# Patient Record
Sex: Female | Born: 1985
Health system: Southern US, Community
[De-identification: ages and names within clinical notes are randomized; demographics above are authoritative.]

## PROBLEM LIST (undated history)

## (undated) ENCOUNTER — Inpatient Hospital Stay (HOSPITAL_COMMUNITY): Payer: Self-pay

## (undated) DIAGNOSIS — M2391 Unspecified internal derangement of right knee: Secondary | ICD-10-CM

## (undated) DIAGNOSIS — Q514 Unicornate uterus: Secondary | ICD-10-CM

## (undated) DIAGNOSIS — O3400 Maternal care for unspecified congenital malformation of uterus, unspecified trimester: Secondary | ICD-10-CM

## (undated) DIAGNOSIS — E162 Hypoglycemia, unspecified: Secondary | ICD-10-CM

## (undated) DIAGNOSIS — E039 Hypothyroidism, unspecified: Secondary | ICD-10-CM

## (undated) DIAGNOSIS — L309 Dermatitis, unspecified: Secondary | ICD-10-CM

## (undated) HISTORY — PX: TONSILLECTOMY AND ADENOIDECTOMY: SUR1326

## (undated) HISTORY — DX: Dermatitis, unspecified: L30.9

## (undated) HISTORY — PX: OTHER SURGICAL HISTORY: SHX169

---
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
## 2005-05-27 ENCOUNTER — Encounter: Payer: Self-pay | Admitting: Family Medicine

## 2006-03-25 ENCOUNTER — Other Ambulatory Visit: Admission: RE | Admit: 2006-03-25 | Discharge: 2006-03-25 | Payer: Self-pay | Admitting: Gynecology

## 2006-11-24 ENCOUNTER — Other Ambulatory Visit: Payer: Self-pay

## 2006-11-24 ENCOUNTER — Emergency Department: Payer: Self-pay | Admitting: Emergency Medicine

## 2007-04-01 ENCOUNTER — Emergency Department (HOSPITAL_COMMUNITY): Admission: EM | Admit: 2007-04-01 | Discharge: 2007-04-02 | Payer: Self-pay | Admitting: Emergency Medicine

## 2007-11-10 ENCOUNTER — Emergency Department (HOSPITAL_COMMUNITY): Admission: EM | Admit: 2007-11-10 | Discharge: 2007-11-10 | Payer: Self-pay | Admitting: Family Medicine

## 2008-01-11 ENCOUNTER — Ambulatory Visit: Payer: Self-pay | Admitting: Women's Health

## 2008-04-20 ENCOUNTER — Emergency Department (HOSPITAL_COMMUNITY): Admission: EM | Admit: 2008-04-20 | Discharge: 2008-04-20 | Payer: Self-pay | Admitting: Emergency Medicine

## 2008-08-03 ENCOUNTER — Emergency Department (HOSPITAL_COMMUNITY): Admission: EM | Admit: 2008-08-03 | Discharge: 2008-08-03 | Payer: Self-pay | Admitting: Emergency Medicine

## 2008-11-19 ENCOUNTER — Emergency Department: Payer: Self-pay | Admitting: Emergency Medicine

## 2008-11-22 ENCOUNTER — Encounter: Admission: RE | Admit: 2008-11-22 | Discharge: 2009-01-16 | Payer: Self-pay | Admitting: Occupational Medicine

## 2009-01-22 ENCOUNTER — Emergency Department (HOSPITAL_COMMUNITY): Admission: EM | Admit: 2009-01-22 | Discharge: 2009-01-22 | Payer: Self-pay | Admitting: Emergency Medicine

## 2009-01-28 ENCOUNTER — Ambulatory Visit (HOSPITAL_COMMUNITY): Admission: RE | Admit: 2009-01-28 | Discharge: 2009-01-28 | Payer: Self-pay | Admitting: Internal Medicine

## 2009-01-30 ENCOUNTER — Encounter: Admission: RE | Admit: 2009-01-30 | Discharge: 2009-02-20 | Payer: Self-pay | Admitting: Occupational Medicine

## 2009-02-08 ENCOUNTER — Encounter: Payer: Self-pay | Admitting: Family Medicine

## 2009-02-25 ENCOUNTER — Ambulatory Visit: Payer: Self-pay | Admitting: Family Medicine

## 2009-02-25 DIAGNOSIS — M25519 Pain in unspecified shoulder: Secondary | ICD-10-CM | POA: Insufficient documentation

## 2009-02-26 ENCOUNTER — Encounter: Payer: Self-pay | Admitting: Family Medicine

## 2010-02-27 NOTE — Letter (Signed)
Summary: [facility] Referral Form  [facility] Referral Form   Imported By: Marily Memos 02/26/2009 10:10:19  _____________________________________________________________________  External Attachment:    Type:   Image     Comment:   External Document
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
## 2010-02-27 NOTE — Consult Note (Signed)
Summary: [facility] occupational Health  [facility] occupational Health   Imported By: Marily Memos 03/04/2009 10:47:25  _____________________________________________________________________  External Attachment:    Type:   Image     Comment:   External Document

## 2010-10-20 LAB — I-STAT 8, (EC8 V) (CONVERTED LAB)
Acid-base deficit: 1
BUN: 9
Chloride: 109
HCT: 40
Operator id: 222501
Potassium: 3.6
pH, Ven: 7.383 — ABNORMAL HIGH

## 2010-10-20 LAB — DIFFERENTIAL
Eosinophils Absolute: 0.3
Eosinophils Relative: 3
Lymphocytes Relative: 34
Lymphs Abs: 3
Monocytes Relative: 8

## 2010-10-20 LAB — CBC
HCT: 37.4
Hemoglobin: 12.6
MCHC: 33.7
MCV: 85.2
Platelets: 267
RBC: 4.38
RDW: 13.5
WBC: 8.9

## 2010-10-20 LAB — POCT I-STAT CREATININE
Creatinine, Ser: 0.7
Operator id: 222501

## 2010-10-20 LAB — PREGNANCY, URINE: Preg Test, Ur: NEGATIVE

## 2010-12-10 ENCOUNTER — Encounter: Payer: Self-pay | Admitting: Gynecology

## 2010-12-11 ENCOUNTER — Other Ambulatory Visit (HOSPITAL_COMMUNITY)
Admission: RE | Admit: 2010-12-11 | Discharge: 2010-12-11 | Disposition: A | Discharge status: Home / Self Care | Payer: 59 | Source: Ambulatory Visit | Attending: Women's Health | Admitting: Women's Health

## 2010-12-11 ENCOUNTER — Ambulatory Visit (INDEPENDENT_AMBULATORY_CARE_PROVIDER_SITE_OTHER): Payer: 59 | Admitting: Women's Health

## 2010-12-11 ENCOUNTER — Encounter: Payer: Self-pay | Admitting: Women's Health

## 2010-12-11 VITALS — BP 120/70 | Ht 69.0 in | Wt 220.0 lb

## 2010-12-11 DIAGNOSIS — Z01419 Encounter for gynecological examination (general) (routine) without abnormal findings: Secondary | ICD-10-CM | POA: Insufficient documentation

## 2010-12-11 DIAGNOSIS — R350 Frequency of micturition: Secondary | ICD-10-CM

## 2010-12-11 DIAGNOSIS — N920 Excessive and frequent menstruation with regular cycle: Secondary | ICD-10-CM

## 2010-12-11 DIAGNOSIS — Z833 Family history of diabetes mellitus: Secondary | ICD-10-CM

## 2010-12-11 NOTE — Progress Notes (Signed)
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   Past medical history, past surgical history, family history and social history were all reviewed and documented in the EPIC chart.   ROS:  A  ROS was performed and pertinent positives and negatives are included in the history.  Exam:  Filed Vitals:   12/11/10 1208  BP: 120/70    General appearance:  Normal Head/Neck:  Normal, without cervical or supraclavicular adenopathy. Thyroid:  Symmetrical, normal in size, without palpable masses or nodularity. Respiratory  Effort:  Normal  Auscultation:  Clear without wheezing or rhonchi Cardiovascular  Auscultation:  Regular rate, without rubs, murmurs or gallops  Edema/varicosities:  Not grossly evident Abdominal  Soft,nontender, without masses, guarding or rebound.  Liver/spleen:  No organomegaly noted  Hernia:  None appreciated  Skin  Inspection:  Grossly normal  Palpation:  Grossly normal Neurologic/psychiatric  Orientation:  Normal with appropriate conversation.  Mood/affect:  Normal  Genitourinary    Breasts: Examined lying and sitting.     Right: Without masses, retractions, discharge or axillary adenopathy.     Left: Without masses, retractions, discharge or axillary adenopathy.   Inguinal/mons:  Normal without inguinal adenopathy  External genitalia:  Normal  BUS/Urethra/Skene's glands:  Normal  Bladder:  Normal  Vagina:  Normal  Cervix:  Normal  Uterus:   normal in size, shape and contour.  Midline and mobile  Adnexa/parametria:     Rt: Without masses or tenderness.   Lt: Without masses or tenderness.  Anus and perineum: Normal  Digital rectal exam: Normal sphincter tone without palpated masses or tenderness  Assessment/Plan:  25 y.o. MWF G0  for annual exam. History of irregular periods with amenorrhea, now having cycles most months but lasting 7-14 days. History of a normal TSH and prolactin  in 03. Gardasil completed 08, last Pap in 08 was normal. Recently married same partner. Complaint of increased urinary frequency and urgency.  Menorrhagia  Plan: UA negative will check urine culture. CBC, glucose, TSH, prolactin, UA and Pap. SBEs, increase exercise, decrease calories for weight loss. Contraception options were reviewed, declines will continue with condoms. Will keep a menstrual calendar, return to office for a sonohysterogram if cycles continue to be greater than 7 days.     Harrington Challenger [facility], 12:42 PM 12/11/2010

## 2010-12-12 ENCOUNTER — Other Ambulatory Visit: Payer: Self-pay

## 2010-12-12 MED ORDER — HYDROCORTISONE BUTYRATE 0.1 % EX CREA: 1.0000 "application " | TOPICAL_CREAM | Freq: Two times a day (BID) | CUTANEOUS | Status: AC

## 2010-12-12 NOTE — Telephone Encounter (Signed)
PT. FORGOT TO ASK AT RECENT VISIT WITH YOU TO REFILL HER ECZEMA CREAM.

## 2010-12-12 NOTE — Telephone Encounter (Signed)
PT. NOTIFIED BY CELL # NANCY REFILLED RX FOR HER.

## 2011-02-02 ENCOUNTER — Telehealth: Payer: Self-pay | Admitting: *Deleted

## 2011-02-02 NOTE — Telephone Encounter (Signed)
Pt calling saying she had two pregnancy test 1 on Friday and 1 on Saturday. Then this am a negative test. Pt wants to know if she should make OV or have labs drawn? LMP:  10/16/10 pt said that her periods are very irregular. Please advise

## 2011-02-02 NOTE — Telephone Encounter (Signed)
Telephone call states had one positive U PT on Friday. Negative Saturday and today. Instructed to schedule office visit. History of irregular cycles, uses condoms usually.

## 2011-02-04 ENCOUNTER — Ambulatory Visit (INDEPENDENT_AMBULATORY_CARE_PROVIDER_SITE_OTHER): Payer: 59 | Admitting: Women's Health

## 2011-02-04 ENCOUNTER — Encounter: Payer: Self-pay | Admitting: Women's Health

## 2011-02-04 ENCOUNTER — Other Ambulatory Visit: Payer: Self-pay | Admitting: Women's Health

## 2011-02-04 DIAGNOSIS — M549 Dorsalgia, unspecified: Secondary | ICD-10-CM

## 2011-02-04 DIAGNOSIS — N912 Amenorrhea, unspecified: Secondary | ICD-10-CM

## 2011-02-04 LAB — URINALYSIS, MICROSCOPIC ONLY

## 2011-02-04 LAB — URINALYSIS, ROUTINE W REFLEX MICROSCOPIC
Bilirubin Urine: NEGATIVE
Ketones, ur: 40 mg/dL — AB
Nitrite: NEGATIVE
Protein, ur: NEGATIVE mg/dL
Urobilinogen, UA: 0.2 mg/dL (ref 0.0–1.0)

## 2011-02-04 MED ORDER — MEDROXYPROGESTERONE ACETATE 10 MG PO TABS: 10.0000 mg | ORAL_TABLET | Freq: Every day | ORAL | Status: DC

## 2011-02-04 NOTE — Progress Notes (Signed)
Patient ID: Melissa Torres, female   DOB: 01-09-1986, 26 y.o.   MRN: [id number] Presents with a complaint of amenorrhea, no cycle since end of September. Condoms for contraception. Has had problems with irregular cycles in the past with a normal TSH and prolactin. Took a home U PT that was positive on 1/4, negative U PTs on 1/5 and 1/6. States also had some nausea and abdominal pain, none today. Denies any urinary symptoms or discharge  Exam: No CVAT, no abdominal pain rebound or radiation. Abdomen soft. External genitalia is within normal limits. Bimanual no CMT or adnexal fullness or tenderness uterus was small.  Amenorrhea/irregular cycles  Plan: Qual hCG, if negative withdraw with Provera 10 for 5 days. Instructed to call if no cycle after Provera. Reviewed importance of cycling at least every 60 days. Instructed to call or return if cycles space greater than 60 days.

## 2011-05-30 ENCOUNTER — Encounter (HOSPITAL_COMMUNITY): Payer: Self-pay | Admitting: Emergency Medicine

## 2011-05-30 ENCOUNTER — Emergency Department (HOSPITAL_COMMUNITY)
Admission: EM | Admit: 2011-05-30 | Discharge: 2011-05-30 | Disposition: A | Discharge status: Home / Self Care | Payer: 59 | Source: Home / Self Care | Attending: Emergency Medicine | Admitting: Emergency Medicine

## 2011-05-30 DIAGNOSIS — N939 Abnormal uterine and vaginal bleeding, unspecified: Secondary | ICD-10-CM

## 2011-05-30 DIAGNOSIS — N898 Other specified noninflammatory disorders of vagina: Secondary | ICD-10-CM

## 2011-05-30 HISTORY — DX: Hypoglycemia, unspecified: E16.2

## 2011-05-30 LAB — CBC
HCT: 38.1 % (ref 36.0–46.0)
Hemoglobin: 13 g/dL (ref 12.0–15.0)
MCH: 29.1 pg (ref 26.0–34.0)
MCHC: 34.1 g/dL (ref 30.0–36.0)
MCV: 85.2 fL (ref 78.0–100.0)
Platelets: 244 K/uL (ref 150–400)
RBC: 4.47 MIL/uL (ref 3.87–5.11)
RDW: 12.6 % (ref 11.5–15.5)
WBC: 6.6 K/uL (ref 4.0–10.5)

## 2011-05-30 LAB — POCT PREGNANCY, URINE: Preg Test, Ur: NEGATIVE

## 2011-05-30 LAB — DIFFERENTIAL
Eosinophils Absolute: 0.4 10*3/uL (ref 0.0–0.7)
Lymphocytes Relative: 34 % (ref 12–46)
Lymphs Abs: 2.2 10*3/uL (ref 0.7–4.0)
Neutro Abs: 3.4 10*3/uL (ref 1.7–7.7)
Neutrophils Relative %: 52 % (ref 43–77)

## 2011-05-30 NOTE — ED Notes (Signed)
Patient was very clammy, hyperventilating just prior to lab draw.  With great coaching, patient calmed, placed supine, cool cloth to forehead, family at bedside

## 2011-05-30 NOTE — ED Notes (Signed)
Pelvic equipment at bedside.  Patient undressed.

## 2011-05-30 NOTE — ED Provider Notes (Signed)
History     CSN: [id number]  Arrival date & time 05/30/11  1452   First MD Initiated Contact with Patient 05/30/11 1522      No chief complaint on file.   (Consider location/radiation/quality/duration/timing/severity/associated sxs/prior treatment) Patient is a 26 y.o. female presenting with vaginal bleeding. The history is provided by the patient. No language interpreter was used.  Vaginal Bleeding This is a new problem. The current episode started more than 1 week ago. The problem occurs constantly. The problem has been gradually worsening. Pertinent negatives include no chest pain and no abdominal pain. The symptoms are aggravated by nothing. The symptoms are relieved by nothing. She has tried nothing for the symptoms.  Pt complains of heavy vaginal bleeding.    No past medical history on file.  Past Surgical History  Procedure Date  . Tonsillectomy and adenoidectomy     Family History  Problem Relation Age of Onset  . Hypertension Mother   . Hypertension Maternal Grandmother   . Diabetes Maternal Grandfather     History  Substance Use Topics  . Smoking status: Never Smoker   . Smokeless tobacco: Never Used  . Alcohol Use: Yes     occassionally    OB History    Grav Para Term Preterm Abortions TAB SAB Ect Mult Living   0               Review of Systems  Cardiovascular: Negative for chest pain.  Gastrointestinal: Negative for abdominal pain.  Genitourinary: Positive for vaginal bleeding.  All other systems reviewed and are negative.    Allergies  Codeine  Home Medications   Current Outpatient Rx  Name Route Sig Dispense Refill  . HYDROCORTISONE 1 % EX CREA Topical Apply topically as needed.      Marland Kitchen MEDROXYPROGESTERONE ACETATE 10 MG PO TABS Oral Take 1 tablet (10 mg total) by mouth daily. 5 tablet 0    BP 125/86  Pulse 85  Temp(Src) 98.3 F (36.8 C) (Oral)  Resp 18  SpO2 98%  Physical Exam  Nursing note and vitals reviewed. Constitutional: She  appears well-developed and well-nourished.  HENT:  Head: Normocephalic and atraumatic.  Right Ear: External ear normal.  Left Ear: External ear normal.  Nose: Nose normal.  Eyes: Conjunctivae and EOM are normal. Pupils are equal, round, and reactive to light.  Neck: Normal range of motion. Neck supple.  Pulmonary/Chest: Effort normal.  Abdominal: Soft.  Genitourinary: Uterus normal.       Moderate vaginal bleeding  Musculoskeletal: Normal range of motion.  Neurological: She is alert.  Skin: Skin is warm.    ED Course  Procedures (including critical care time)  Labs Reviewed - No data to display No results found.   No diagnosis found.    MDM  No evidence of anemia,  No hemorrhage        Lonia Skinner [place], [place] 05/30/11 1709

## 2011-05-30 NOTE — ED Notes (Signed)
Assisted with lab draw, butterfly needle and syringe to right ac.  10cc blood drawn slowly.  Dressing applied.  Blood taken by jasmine, phlebotomy

## 2011-05-30 NOTE — Discharge Instructions (Signed)
Abnormal Uterine Bleeding Abnormal uterine bleeding can have many causes. Some cases are simply treated, while others are more serious. There are several kinds of bleeding that is considered abnormal, including:  Bleeding between periods.   Bleeding after sexual intercourse.   Spotting anytime in the menstrual cycle.   Bleeding heavier or more than normal.   Bleeding after menopause.  CAUSES  There are many causes of abnormal uterine bleeding. It can be present in teenagers, pregnant women, women during their reproductive years, and women who have reached menopause. Your caregiver will look for the more common causes depending on your age, signs, symptoms and your particular circumstance. Most cases are not serious and can be treated. Even the more serious causes, like cancer of the female organs, can be treated adequately if found in the early stages. That is why all types of bleeding should be evaluated and treated as soon as possible. DIAGNOSIS  Diagnosing the cause may take several kinds of tests. Your caregiver may:  Take a complete history of the type of bleeding.   Perform a complete physical exam and Pap smear.   Take an ultrasound on the abdomen showing a picture of the female organs and the pelvis.   Inject dye into the uterus and Fallopian tubes and X-ray them (hysterosalpingogram).   Place fluid in the uterus and do an ultrasound (sonohysterogrqphy).   Take a CT scan to examine the female organs and pelvis.   Take an MRI to examine the female organs and pelvis. There is no X-ray involved with this procedure.   Look inside the uterus with a telescope that has a light at the end (hysteroscopy).   Scrap the inside of the uterus to get tissue to examine (Dilatation and Curettage, D&C).   Look into the pelvis with a telescope that has a light at the end (laparoscopy). This is done through a very small cut (incision) in the abdomen.  TREATMENT  Treatment will depend on the  cause of the abnormal bleeding. It can include:  Doing nothing to allow the problem to take care of itself over time.   Hormone treatment.   Birth control pills.   Treating the medical condition causing the problem.   Laparoscopy.   Major or minor surgery   Destroying the lining of the uterus with electrical currant, laser, freezing or heat (uterine ablation).  HOME CARE INSTRUCTIONS   Follow your caregiver's recommendation on how to treat your problem.   See your caregiver if you missed a menstrual period and think you may be pregnant.   If you are bleeding heavily, count the number of pads/tampons you use and how often you have to change them. Tell this to your caregiver.   Avoid sexual intercourse until the problem is controlled.  SEEK MEDICAL CARE IF:   You have any kind of abnormal bleeding mentioned above.   You feel dizzy at times.   You are 26 years old and have not had a menstrual period yet.  SEEK IMMEDIATE MEDICAL CARE IF:   You pass out.   You are changing pads/tampons every 15 to 30 minutes.   You have belly (abdominal) pain.   You have a temperature of 100 F (37.8 C) or higher.   You become sweaty or weak.   You are passing large blood clots from the vagina.   You start to feel sick to your stomach (nauseous) and throw up (vomit).  Document Released: 01/12/2005 Document Revised: 01/01/2011 Document Reviewed: 06/07/2008 Exit[employer]   Patient Information 2012 [employer]. 

## 2011-05-30 NOTE — ED Notes (Signed)
Family at bedside.assisted patient with getting cleaned from exam and dressing.  Equipment removed.

## 2011-05-30 NOTE — ED Provider Notes (Signed)
Medical screening examination/treatment/procedure(s) were performed by non-physician practitioner and as supervising physician I was immediately available for consultation/collaboration.  Raynald Blend, MD 05/30/11 [phone]

## 2011-05-30 NOTE — ED Notes (Signed)
Reports 2 day history of dizzy, weak spells, nearly passing out.  Reports having menstrual cycle for 3 months, from light bleeding to passing clots.  Saw pcp in January for not having a period, period started with taking prescribed medicine.  Has not spoken to pcp about current complaints.  Marland Kitchen

## 2011-06-01 ENCOUNTER — Other Ambulatory Visit: Payer: Self-pay | Admitting: Women's Health

## 2011-06-01 DIAGNOSIS — N921 Excessive and frequent menstruation with irregular cycle: Secondary | ICD-10-CM

## 2011-06-01 NOTE — Progress Notes (Signed)
Telephone call, states went to the hospital, had a  H&H 13/38, negative pregnancy test. History of irregular cycles, did not use Provera when seen in the office in January for amenorrhea, cycle started on its own, but has had irregular bleeding since. Will take Provera now to stop bleeding. Sonohysterogram next week with Dr. Audie Box.

## 2011-06-02 ENCOUNTER — Telehealth: Payer: Self-pay | Admitting: *Deleted

## 2011-06-02 NOTE — Telephone Encounter (Signed)
Pt doesn't want to do SHGM in the office. She is scared to death! She hasnt eaten much the past week just thinking about it. She asked about doing it in the OR and then i mentioned cost to her and she is going to ask her insurance what her deductible is and co insurance and i mentioned maybe giving medication in the office to do it. Can you call her?

## 2011-06-03 ENCOUNTER — Other Ambulatory Visit: Payer: Self-pay | Admitting: Women's Health

## 2011-06-03 DIAGNOSIS — F419 Anxiety disorder, unspecified: Secondary | ICD-10-CM

## 2011-06-03 MED ORDER — DIAZEPAM 5 MG PO TABS: 5.0000 mg | ORAL_TABLET | Freq: Four times a day (QID) | ORAL | Status: AC | PRN

## 2011-06-03 NOTE — Telephone Encounter (Signed)
Left message on cell to call in regards to scheduling sonohysterogram and possibly taking medication to relax prior.

## 2011-06-03 NOTE — Telephone Encounter (Signed)
Telephone call, states has fear of doctors and procedures. Will take Valium 5 mg 3 hours before procedure and when arrives to office for sonohysterogram with Dr. Audie Box. Instructed to have someone drive her to and from appointment.

## 2011-06-04 NOTE — Progress Notes (Signed)
GENERIC VALIUM RX PRINTED AT OFFICE BUT TIM AT [facility] STATES NANCY CALLED IN RX AND PT. ALREADY PICKED IT UP.

## 2011-06-08 ENCOUNTER — Telehealth: Payer: Self-pay | Admitting: *Deleted

## 2011-06-08 MED ORDER — MEGESTROL ACETATE 20 MG PO TABS: 20.0000 mg | ORAL_TABLET | Freq: Two times a day (BID) | ORAL | Status: DC

## 2011-06-08 NOTE — Telephone Encounter (Signed)
I would keep the sonohysterogram appointment.

## 2011-06-08 NOTE — Telephone Encounter (Signed)
PT CALLING C/O BLEEDING AFTER TAKEN PROVERA #5 GIVEN BY NANCY ON 06/02/11. PT IS SCHEDULE FOR SHGM  ON 06/11/11, PT SAID THAT SHE WOULD CANCEL HER SHGM AND JUST TALK ABOUT A POSSIBLE D & C ? PT IS AWARE NANCY WILL RETURN ON WEDNESDAY. PT WOULD LIKE TO KNOW YOUR RECOMMENDATION, SHOULD SHE KEEP SHGM APPOINTMENT AND SPEAK WITH NANCY AT LATER DAY? PLEASE ADVISE

## 2011-06-08 NOTE — Telephone Encounter (Signed)
PT SAID SHE IS STILL BLEEDING, SHOULD SHE HAVE ANOTHER RX TO STOP?

## 2011-06-08 NOTE — Telephone Encounter (Signed)
I would suggest Megace 20 mg twice daily through sonohysterogram. Just make sure that pregnancy is not a possibility. If any question I would check a blood pregnancy test.

## 2011-06-08 NOTE — Telephone Encounter (Signed)
Pt informed with the below note. 

## 2011-06-10 ENCOUNTER — Telehealth: Payer: Self-pay | Admitting: Women's Health

## 2011-06-10 ENCOUNTER — Telehealth: Payer: Self-pay | Admitting: Gynecology

## 2011-06-10 NOTE — Telephone Encounter (Signed)
Telephone call from [place], states would prefer to have sonohysterogram done in the OR.  If uterine polyp, can then have removed at the same time. Was scheduled for sonohysterogram tomorrow in the office, states is still bleeding. Will double up on Megace and take 40 mg twice daily today and if bleeding starts to decrease will then decrease to 20 mg twice a day until sonohysterogram. Instructed to call if continues to have heavy bleeding. Has a fear of physicians and procedures. Will try to schedule sonohysterogram in the next week with Dr. Audie Box.

## 2011-06-10 NOTE — Telephone Encounter (Signed)
Left message on cell number. per patient's request called back in regards to sonohysterogram scheduled for today.

## 2011-06-10 NOTE — Telephone Encounter (Signed)
I called patient because earlier today [place] sent me a message to schedule her for [facility] in the OR due to patient had requested due to anxiety about office procedure. She wanted to do it there in case Dr. Velvet Bathe found something, he could take care of it then.  When I called patient and told her Dr. Velvet Bathe recommended U/S in the office and then he would do D&C, Hysteroscopy at the hospital she said she thought he wanted to do [facility].  I told her he did but these recommendations were because we had understood that she did not feel like she could do SHGM due to anxiety. She said she had talked with [place] again and she had prescribed some Valium for her to take and she was willing to come in and do the [facility].   We scheduled her [facility] for Friday May 31 at 3:30pm.  I assured her we will take good care of her and tried reassuring her that this is common office procedure and patients do well with it.

## 2011-06-11 ENCOUNTER — Other Ambulatory Visit: Payer: Self-pay | Admitting: Women's Health

## 2011-06-11 ENCOUNTER — Ambulatory Visit: Payer: 59 | Admitting: Gynecology

## 2011-06-11 ENCOUNTER — Other Ambulatory Visit: Payer: 59

## 2011-06-11 MED ORDER — MEGESTROL ACETATE 20 MG PO TABS: 20.0000 mg | ORAL_TABLET | Freq: Two times a day (BID) | ORAL | Status: DC

## 2011-06-11 NOTE — Telephone Encounter (Signed)
Pt ended up calling Olegario Messier and have since scheduled surgery. KW

## 2011-06-11 NOTE — Telephone Encounter (Signed)
Telephone call states bleeding slowing down, used 8 pads today, needs refill of megace. Will continue 20 mg until bleeding stops.

## 2011-06-18 ENCOUNTER — Other Ambulatory Visit: Payer: Self-pay | Admitting: Women's Health

## 2011-06-18 ENCOUNTER — Telehealth: Payer: Self-pay | Admitting: *Deleted

## 2011-06-18 DIAGNOSIS — N920 Excessive and frequent menstruation with regular cycle: Secondary | ICD-10-CM

## 2011-06-18 MED ORDER — MEGESTROL ACETATE 20 MG PO TABS: 20.0000 mg | ORAL_TABLET | Freq: Two times a day (BID) | ORAL | Status: AC

## 2011-06-18 NOTE — Progress Notes (Signed)
Telephone call from patient, states bleeding had slowed, now increased bleeding today, has used about 10 pads today, passing clots. States has already taken 3 prescriptions of Megace. Is scheduled for a sonohysterogram on 5/31 with Dr. Audie Box. First sonohysterogram had to be canceled due to bleeding. Will start on Megace 20 twice a day will take 2 tablets tonight and tomorrow morning, reviewed if bleeding continues to be heavy to take 2 tablets twice daily until bleeding slows/stops and then 1 tablet twice daily until procedure.

## 2011-06-18 NOTE — Telephone Encounter (Signed)
Left message for pt to call,pt left voicemail regarding vaginal bleeding.

## 2011-06-19 ENCOUNTER — Other Ambulatory Visit: Payer: Self-pay | Admitting: Women's Health

## 2011-06-19 DIAGNOSIS — N921 Excessive and frequent menstruation with irregular cycle: Secondary | ICD-10-CM

## 2011-06-24 ENCOUNTER — Telehealth: Payer: Self-pay | Admitting: *Deleted

## 2011-06-24 NOTE — Telephone Encounter (Signed)
Monitor at present as ultrasound is in 2 days and follow up for her ultrasound regardless of bleeding.

## 2011-06-24 NOTE — Telephone Encounter (Signed)
Pt is scheduled for [facility] on Friday and wanted you to know she is still bleeding while taking the megace 20 mg, rx never stopped bleeding. Pt was told to follow up.

## 2011-06-24 NOTE — Telephone Encounter (Signed)
Left the below note on voicemail. 

## 2011-06-26 ENCOUNTER — Encounter: Payer: Self-pay | Admitting: Gynecology

## 2011-06-26 ENCOUNTER — Ambulatory Visit (INDEPENDENT_AMBULATORY_CARE_PROVIDER_SITE_OTHER): Payer: 59 | Admitting: Gynecology

## 2011-06-26 ENCOUNTER — Ambulatory Visit (INDEPENDENT_AMBULATORY_CARE_PROVIDER_SITE_OTHER): Payer: 59

## 2011-06-26 DIAGNOSIS — N949 Unspecified condition associated with female genital organs and menstrual cycle: Secondary | ICD-10-CM

## 2011-06-26 DIAGNOSIS — N938 Other specified abnormal uterine and vaginal bleeding: Secondary | ICD-10-CM

## 2011-06-26 DIAGNOSIS — N921 Excessive and frequent menstruation with irregular cycle: Secondary | ICD-10-CM

## 2011-06-26 DIAGNOSIS — N926 Irregular menstruation, unspecified: Secondary | ICD-10-CM

## 2011-06-26 DIAGNOSIS — N920 Excessive and frequent menstruation with regular cycle: Secondary | ICD-10-CM

## 2011-06-26 MED ORDER — TRAMADOL HCL 50 MG PO TABS: 50.0000 mg | ORAL_TABLET | Freq: Four times a day (QID) | ORAL | Status: AC | PRN

## 2011-06-26 NOTE — Patient Instructions (Signed)
Follow up for blood work and biopsy results.

## 2011-06-26 NOTE — Progress Notes (Signed)
Patient follows up for sonohysterogram. She has a lifelong history of irregular menses since menarche. She would go months without menses. Her last normal menstrual period was in the fall. She then skipped several months and started bleeding in January. She bled continuously alternating between light and heavy. She took progesterone withdrawal and continued to bleed. She ultimately was started on Megace but continued to bleed on that and now presents for sonohysterogram.  Ultrasound shows endometrial echo 4.4 mm. Retroverted uterus with normal size shape and echotexture. Right and left ovaries grossly normal with physiologic changes. Cul-de-sac is negative. Sonohysterogram was performed, sterile technique, easy catheter introduction, good distention with no overt abnormalities. There is some irregularity to the endometrial cavity which I think is more endometrial related. No gross polyps or myomas. Endometrial sample was taken. Patient tolerated well.  Assessment and plan: Irregular menses with PCO-type pattern. We'll check baseline labs to include TSH FSH prolactin hCG. She does note some bruisability am going to check a von Willebrand's panel also.  We'll recheck CBC to compare to previous value. Patient will follow up for lab results and biopsy. Various scenarios and options were reviewed. I think if labs and biopsy are normal I am going to start her on a low-dose oral contraceptive and see if we can't regulate her over the next several months. Possibilities for hysteroscopy D&C if irregular bleeding continues was discussed.  A prescription for Ultram 50 mg #30 one by mouth every 6 hours prescribed to help with cramping. She has tried Motrin and this does not seem to help.

## 2011-06-27 LAB — CBC WITH DIFFERENTIAL/PLATELET
Basophils Absolute: 0 10*3/uL (ref 0.0–0.1)
HCT: 27.4 % — ABNORMAL LOW (ref 36.0–46.0)
Lymphocytes Relative: 40 % (ref 12–46)
Monocytes Absolute: 0.4 10*3/uL (ref 0.1–1.0)
Neutro Abs: 2.9 10*3/uL (ref 1.7–7.7)
RBC: 3.24 MIL/uL — ABNORMAL LOW (ref 3.87–5.11)
RDW: 14 % (ref 11.5–15.5)
WBC: 6.5 10*3/uL (ref 4.0–10.5)

## 2011-06-27 LAB — HCG, QUANTITATIVE, PREGNANCY: hCG, Beta Chain, Quant, S: <2 m[IU]/mL

## 2011-06-27 LAB — TSH: TSH: 2.849 u[IU]/mL (ref 0.350–4.500)

## 2011-06-29 LAB — VON WILLEBRAND PANEL: Von Willebrand Antigen, Plasma: 72 % (ref 50–217)

## 2011-06-30 MED ORDER — NORGESTIMATE-ETH ESTRADIOL 0.25-35 MG-MCG PO TABS: 1.0000 | ORAL_TABLET | Freq: Every day | ORAL | Status: DC

## 2011-12-22 ENCOUNTER — Encounter: Payer: Self-pay | Admitting: *Deleted

## 2011-12-22 NOTE — Progress Notes (Signed)
Patient ID: Melissa Torres, female   DOB: 14-Apr-1985, 26 y.o.   MRN: [id number] Pt called left message in voicemail requesting to speak with nancy about irregular cycle? Left message for pt to call.

## 2012-01-05 ENCOUNTER — Other Ambulatory Visit: Payer: Self-pay | Admitting: Gynecology

## 2012-01-13 ENCOUNTER — Telehealth: Payer: Self-pay | Admitting: *Deleted

## 2012-01-13 MED ORDER — HYDROCORTISONE 1 % EX CREA: TOPICAL_CREAM | CUTANEOUS | Status: DC | PRN

## 2012-01-13 NOTE — Telephone Encounter (Signed)
Pt called requesting refill on hydrocortisone cream, rx sent. Pt uses for her hands.

## 2012-02-02 ENCOUNTER — Telehealth: Payer: Self-pay | Admitting: *Deleted

## 2012-02-02 MED ORDER — NORGESTIMATE-ETH ESTRADIOL 0.25-35 MG-MCG PO TABS: 1.0000 | ORAL_TABLET | Freq: Every day | ORAL | Status: DC

## 2012-02-02 NOTE — Telephone Encounter (Signed)
Pt called requesting refill on sprintec birth control. Pt overdue for annual in Nov 2013. Pt scheduled annual on 02/15/12. With nancy. 1 pack will be sent. Pt is aware she must keep her appointment.

## 2012-02-15 ENCOUNTER — Encounter: Payer: 59 | Admitting: Women's Health

## 2012-02-23 ENCOUNTER — Other Ambulatory Visit: Payer: Self-pay | Admitting: Gynecology

## 2012-02-23 MED ORDER — NORGESTIMATE-ETH ESTRADIOL 0.25-35 MG-MCG PO TABS: 1.0000 | ORAL_TABLET | Freq: Every day | ORAL | Status: DC

## 2012-03-12 ENCOUNTER — Other Ambulatory Visit: Payer: Self-pay

## 2012-03-22 ENCOUNTER — Emergency Department (HOSPITAL_COMMUNITY)
Admission: EM | Admit: 2012-03-22 | Discharge: 2012-03-22 | Disposition: A | Discharge status: Home / Self Care | Payer: 59 | Source: Home / Self Care | Attending: Family Medicine | Admitting: Family Medicine

## 2012-03-22 ENCOUNTER — Encounter (HOSPITAL_COMMUNITY): Payer: Self-pay | Admitting: *Deleted

## 2012-03-22 DIAGNOSIS — J020 Streptococcal pharyngitis: Secondary | ICD-10-CM

## 2012-03-22 MED ORDER — ACETAMINOPHEN 325 MG PO TABS
ORAL_TABLET | ORAL | Status: AC
  Filled 2012-03-22: qty 2

## 2012-03-22 MED ORDER — ONDANSETRON 4 MG PO TBDP: 4.0000 mg | ORAL_TABLET | Freq: Three times a day (TID) | ORAL | Status: DC | PRN

## 2012-03-22 MED ORDER — HYDROCODONE-ACETAMINOPHEN 7.5-325 MG/15ML PO SOLN: 15.0000 mL | Freq: Three times a day (TID) | ORAL | Status: DC | PRN

## 2012-03-22 MED ORDER — CEPHALEXIN 500 MG PO CAPS: 500.0000 mg | ORAL_CAPSULE | Freq: Three times a day (TID) | ORAL | Status: DC

## 2012-03-22 MED ORDER — IBUPROFEN 600 MG PO TABS
600.0000 mg | ORAL_TABLET | Freq: Three times a day (TID) | ORAL | Status: DC | PRN
Start: 2012-03-22 — End: 2016-05-26

## 2012-03-22 MED ORDER — ACETAMINOPHEN 325 MG PO TABS
650.0000 mg | ORAL_TABLET | Freq: Once | ORAL | Status: AC
  Administered 2012-03-22: 650 mg via ORAL

## 2012-03-22 NOTE — ED Notes (Signed)
Pt  Reports  Symptoms  Of  Chills  Fever  Body  Aches   And  Nausea   Which  Developed  yest       No  Vomiting  No  Diarrhea  -  No  Antipyretics  Today

## 2012-03-22 NOTE — ED Provider Notes (Signed)
History     CSN: [id number]  Arrival date & time 03/22/12  1012   First MD Initiated Contact with Patient 03/22/12 1028      Chief Complaint  Patient presents with  . Fever    (Consider location/radiation/quality/duration/timing/severity/associated sxs/prior treatment) HPI Comments: 27 year old female here complaining of generalized body aches, chills and fever up to 102 that started yesterday. Symptoms associated with sore throat and painful swallowing. Reports minimal cough nonproductive. Denies nasal congestion or rhinorrhea. Has not taken any medications for her symptoms today her temperature is 102 here. Denies chest pain or shortness of breath. Patient reports nausea but has not had any vomiting. Reports generalized abdominal discomfort but no diarrhea. Has decreased appetite but is tolerating fluids and solids. No rash. No other known sick contacts.   Past Medical History  Diagnosis Date  . Hypoglycemia     Past Surgical History  Procedure Laterality Date  . Tonsillectomy and adenoidectomy      Family History  Problem Relation Age of Onset  . Hypertension Mother   . Hypertension Maternal Grandmother   . Diabetes Maternal Grandfather     History  Substance Use Topics  . Smoking status: Never Smoker   . Smokeless tobacco: Never Used  . Alcohol Use: Yes     Comment: occassionally    OB History   Grav Para Term Preterm Abortions TAB SAB Ect Mult Living   0               Review of Systems  Constitutional: Positive for fever and appetite change.  HENT: Positive for sore throat and trouble swallowing. Negative for congestion.   Eyes: Negative for discharge.  Respiratory: Negative for shortness of breath and wheezing.   Gastrointestinal: Positive for nausea. Negative for vomiting and abdominal pain.  Skin: Negative for rash.  Neurological: Positive for headaches. Negative for dizziness.  All other systems reviewed and are negative.    Allergies  Codeine;  Ivp dye; and Latex  Home Medications   Current Outpatient Rx  Name  Route  Sig  Dispense  Refill  . cephALEXin (KEFLEX) 500 MG capsule   Oral   Take 1 capsule (500 mg total) by mouth 3 (three) times daily.   30 capsule   0   . HYDROcodone-acetaminophen (HYCET) 7.5-325 mg/15 ml solution   Oral   Take 15 mLs by mouth every 8 (eight) hours as needed for pain or cough.   120 mL   0   . hydrocortisone cream 1 %   Topical   Apply topically as needed.   30 g   1   . ibuprofen (ADVIL,MOTRIN) 600 MG tablet   Oral   Take 1 tablet (600 mg total) by mouth every 8 (eight) hours as needed for pain or fever.   30 tablet   0   . EXPIRED: medroxyPROGESTERone (PROVERA) 10 MG tablet   Oral   Take 1 tablet (10 mg total) by mouth daily.   5 tablet   0   . norgestimate-ethinyl estradiol (SPRINTEC 28) 0.25-35 MG-MCG tablet   Oral   Take 1 tablet by mouth daily.   28 tablet   2   . ondansetron (ZOFRAN-ODT) 4 MG disintegrating tablet   Oral   Take 1 tablet (4 mg total) by mouth every 8 (eight) hours as needed for nausea.   10 tablet   0     BP 140/40  Pulse 106  Temp(Src) 102 F (38.9 C) (Oral)  Resp 16  SpO2 100%  LMP 03/01/2012  Physical Exam  Nursing note and vitals reviewed. Constitutional: She is oriented to person, place, and time. She appears well-developed and well-nourished. No distress.  HENT:  Head: Normocephalic and atraumatic.  Nose normal, no rhinorrhea. Significant pharyngeal erythema with swelling of pilars and white exudates, s/p tonsilectomy. No uvula deviation. No trismus. TM's with increased vascular markings and some dullness bilaterally no swelling or bulging  Eyes: Conjunctivae and EOM are normal. Pupils are equal, round, and reactive to light. No scleral icterus.  Neck: No thyromegaly present.  Bilateral mildly enlarged tender anterior neck lymphadenopathies.  Cardiovascular: Normal rate, regular rhythm and normal heart sounds.  Exam reveals no  gallop and no friction rub.   No murmur heard. Pulmonary/Chest: Effort normal and breath sounds normal. No respiratory distress. She has no wheezes. She has no rales. She exhibits no tenderness.  Abdominal: Soft. She exhibits no distension and no mass. There is no tenderness. There is no rebound and no guarding.  No hepatosplenomegaly.   Lymphadenopathy:    She has cervical adenopathy.  Neurological: She is alert and oriented to person, place, and time.  Skin: No rash noted. She is not diaphoretic.    ED Course  Procedures (including critical care time)  Labs Reviewed  POCT RAPID STREP A ([facility] CARE ONLY) - Abnormal; Notable for the following:    Streptococcus, Group A Screen (Direct) POSITIVE (*)    All other components within normal limits   No results found.   1. Strep throat       MDM  Treated with Keflex. Also prescribed hydrocodone, ondansetron and ibuprofen. Supportive care and red flags that should prompt her return to medical attention discussed with patient and provided in writing.        Sharin Grave, MD 03/22/12 1154

## 2012-03-28 ENCOUNTER — Ambulatory Visit (INDEPENDENT_AMBULATORY_CARE_PROVIDER_SITE_OTHER): Payer: 59 | Admitting: Women's Health

## 2012-03-28 ENCOUNTER — Other Ambulatory Visit (HOSPITAL_COMMUNITY)
Admission: RE | Admit: 2012-03-28 | Discharge: 2012-03-28 | Disposition: A | Discharge status: Home / Self Care | Payer: 59 | Source: Ambulatory Visit | Attending: Obstetrics and Gynecology | Admitting: Obstetrics and Gynecology

## 2012-03-28 ENCOUNTER — Encounter: Payer: Self-pay | Admitting: Women's Health

## 2012-03-28 VITALS — BP 118/76 | Ht 68.0 in | Wt 182.0 lb

## 2012-03-28 DIAGNOSIS — N912 Amenorrhea, unspecified: Secondary | ICD-10-CM

## 2012-03-28 DIAGNOSIS — Z01419 Encounter for gynecological examination (general) (routine) without abnormal findings: Secondary | ICD-10-CM

## 2012-03-28 DIAGNOSIS — Z309 Encounter for contraceptive management, unspecified: Secondary | ICD-10-CM

## 2012-03-28 DIAGNOSIS — Z833 Family history of diabetes mellitus: Secondary | ICD-10-CM

## 2012-03-28 DIAGNOSIS — IMO0001 Reserved for inherently not codable concepts without codable children: Secondary | ICD-10-CM

## 2012-03-28 LAB — CBC WITH DIFFERENTIAL/PLATELET
Eosinophils Absolute: 0.4 10*3/uL (ref 0.0–0.7)
Eosinophils Relative: 8 % — ABNORMAL HIGH (ref 0–5)
HCT: 36.4 % (ref 36.0–46.0)
Lymphocytes Relative: 35 % (ref 12–46)
Lymphs Abs: 1.7 10*3/uL (ref 0.7–4.0)
MCH: 25.5 pg — ABNORMAL LOW (ref 26.0–34.0)
MCV: 77.3 fL — ABNORMAL LOW (ref 78.0–100.0)
Monocytes Absolute: 0.4 10*3/uL (ref 0.1–1.0)
Monocytes Relative: 7 % (ref 3–12)
Platelets: 326 10*3/uL (ref 150–400)
RBC: 4.71 MIL/uL (ref 3.87–5.11)
WBC: 5 10*3/uL (ref 4.0–10.5)

## 2012-03-28 MED ORDER — NORGESTIMATE-ETH ESTRADIOL 0.25-35 MG-MCG PO TABS: 1.0000 | ORAL_TABLET | Freq: Every day | ORAL | Status: DC

## 2012-03-28 NOTE — Patient Instructions (Signed)

## 2012-03-28 NOTE — Progress Notes (Signed)
Melissa Torres 03-08-1985 [id number]    History:    The patient presents for annual exam.  Regular monthly cycle on Sprintec. History of amenorrhea alternating with 7-14 day cycles with menorrhagia. 05/2011 -  normal sonohysterogram, TSH, prolactin, FSH,  Coagulation factor VIII slightly low (62), hemoglobin and hematocrit 8.7/27.4. History of normal Paps,  gardasil completed 2009. Treated for strep throat last week.  Past medical history, past surgical history, family history and social history were all reviewed and documented in the EPIC chart. Nurse in the OR.   ROS:  A  ROS was performed and pertinent positives and negatives are included in the history.  Exam:  Filed Vitals:   03/28/12 0903  BP: 118/76    General appearance:  Normal Head/Neck:  Normal, without cervical or supraclavicular adenopathy. Thyroid:  Symmetrical, normal in size, without palpable masses or nodularity. Respiratory  Effort:  Normal  Auscultation:  Clear without wheezing or rhonchi Cardiovascular  Auscultation:  Regular rate, without rubs, murmurs or gallops  Edema/varicosities:  Not grossly evident Abdominal  Soft,nontender, without masses, guarding or rebound.  Liver/spleen:  No organomegaly noted  Hernia:  None appreciated  Skin  Inspection:  Grossly normal  Palpation:  Grossly normal Neurologic/psychiatric  Orientation:  Normal with appropriate conversation.  Mood/affect:  Normal  Genitourinary    Breasts: Examined lying and sitting.     Right: Without masses, retractions, discharge or axillary adenopathy.     Left: Without masses, retractions, discharge or axillary adenopathy.   Inguinal/mons:  Normal without inguinal adenopathy  External genitalia:  Normal  BUS/Urethra/Skene's glands:  Normal  Bladder:  Normal  Vagina:  Normal  Cervix:  Normal  Uterus: normal in size, shape and contour.  Midline and mobile  Adnexa/parametria:     Rt: Without masses or tenderness.   Lt: Without  masses or tenderness.  Anus and perineum: Normal  Digital rectal exam: Normal sphincter tone without palpated masses or tenderness  Assessment/Plan:  27 y.o. M. WF G0 for annual exam with occasional abdominal tenderness.  Normal GYN exam on Sprintec Coagulation factor VIII slightly low with anemia  Plan: CBC, coagulation factor VIII, glucose, UA, Pap, normal Pap 11/2010, new screening guidelines reviewed. Sprintec prescription, proper use, slight risk for blood clots and strokes reviewed. Has lost 40 pounds with diet and exercise, will continue healthy lifestyle. Instructed to call if  pain continues, pain appears to be more in the stomach region, pelvic ultrasound and referral as needed.      Harrington Challenger [facility], 12:18 PM 03/28/2012

## 2012-03-29 LAB — URINALYSIS W MICROSCOPIC + REFLEX CULTURE
Bacteria, UA: NONE SEEN
Bilirubin Urine: NEGATIVE
Casts: NONE SEEN
Crystals: NONE SEEN
Glucose, UA: NEGATIVE mg/dL
Ketones, ur: NEGATIVE mg/dL
Specific Gravity, Urine: 1.017 (ref 1.005–1.030)
Squamous Epithelial / LPF: NONE SEEN
Urobilinogen, UA: 0.2 mg/dL (ref 0.0–1.0)
pH: 7 (ref 5.0–8.0)

## 2012-04-06 ENCOUNTER — Encounter (HOSPITAL_COMMUNITY): Payer: Self-pay | Admitting: Emergency Medicine

## 2012-04-06 ENCOUNTER — Emergency Department (HOSPITAL_COMMUNITY)
Admission: EM | Admit: 2012-04-06 | Discharge: 2012-04-06 | Disposition: A | Discharge status: Home / Self Care | Payer: 59 | Source: Home / Self Care | Attending: Family Medicine | Admitting: Family Medicine

## 2012-04-06 DIAGNOSIS — J029 Acute pharyngitis, unspecified: Secondary | ICD-10-CM

## 2012-04-06 MED ORDER — CLINDAMYCIN HCL 300 MG PO CAPS: 300.0000 mg | ORAL_CAPSULE | Freq: Three times a day (TID) | ORAL | Status: DC

## 2012-04-06 NOTE — ED Notes (Signed)
Pt is here for cold sx  Seen here at the [facility] on 03/22/12 Sx today include: sore throat, headache, chills, fatigue, fever Denies: abd pain Finished antibiotics given last time  She is alert w/no signs of acute distress.

## 2012-04-06 NOTE — ED Provider Notes (Signed)
History     CSN: [id number]  Arrival date & time 04/06/12  1513   First MD Initiated Contact with Patient 04/06/12 1545      Chief Complaint  Patient presents with  . URI    (Consider location/radiation/quality/duration/timing/severity/associated sxs/prior treatment) Patient is a 27 y.o. female presenting with pharyngitis. The history is provided by the patient.  Sore Throat This is a recurrent problem. The current episode started yesterday. The problem has been gradually worsening. Associated symptoms include headaches. Associated symptoms comments: Fever, aches . The symptoms are aggravated by swallowing.    Past Medical History  Diagnosis Date  . Hypoglycemia     Past Surgical History  Procedure Laterality Date  . Tonsillectomy and adenoidectomy      Family History  Problem Relation Age of Onset  . Hypertension Mother   . Diabetes Mother   . Hypertension Maternal Grandmother   . Diabetes Maternal Grandfather     History  Substance Use Topics  . Smoking status: Never Smoker   . Smokeless tobacco: Never Used  . Alcohol Use: Yes     Comment: occassionally    OB History   Grav Para Term Preterm Abortions TAB SAB Ect Mult Living   0               Review of Systems  Constitutional: Positive for fever and chills.  HENT: Negative for congestion, rhinorrhea and postnasal drip.   Respiratory: Negative for cough.   Cardiovascular: Negative.   Gastrointestinal: Negative.   Musculoskeletal: Positive for myalgias.  Skin: Negative.   Neurological: Positive for headaches.    Allergies  Codeine; Ivp dye; and Latex  Home Medications   Current Outpatient Rx  Name  Route  Sig  Dispense  Refill  . clindamycin (CLEOCIN) 300 MG capsule   Oral   Take 1 capsule (300 mg total) by mouth 3 (three) times daily.   21 capsule   0   . ibuprofen (ADVIL,MOTRIN) 600 MG tablet   Oral   Take 1 tablet (600 mg total) by mouth every 8 (eight) hours as needed for pain or  fever.   30 tablet   0   . norgestimate-ethinyl estradiol (SPRINTEC 28) 0.25-35 MG-MCG tablet   Oral   Take 1 tablet by mouth daily.   28 tablet   12   . ondansetron (ZOFRAN-ODT) 4 MG disintegrating tablet   Oral   Take 1 tablet (4 mg total) by mouth every 8 (eight) hours as needed for nausea.   10 tablet   0     BP 115/76  Pulse 94  Temp(Src) 98.3 F (36.8 C) (Oral)  Resp 18  SpO2 100%  LMP 03/30/2012  Physical Exam  Nursing note and vitals reviewed. Constitutional: She is oriented to person, place, and time. She appears well-developed and well-nourished.  HENT:  Head: Normocephalic.  Right Ear: External ear normal.  Left Ear: External ear normal.  Mouth/Throat: Mucous membranes are normal. Posterior oropharyngeal erythema present. No tonsillar abscesses.    Neck: Normal range of motion. Neck supple.  Cardiovascular: Regular rhythm and normal heart sounds.   Pulmonary/Chest: Breath sounds normal.  Lymphadenopathy:    She has cervical adenopathy.  Neurological: She is alert and oriented to person, place, and time.  Skin: Skin is warm and dry.    ED Course  Procedures (including critical care time)  Labs Reviewed - No data to display No results found.   1. Pharyngitis, acute  MDM          Linna Hoff, MD 04/06/12 [id number]

## 2012-05-22 ENCOUNTER — Other Ambulatory Visit: Payer: Self-pay | Admitting: Gynecology

## 2012-05-25 ENCOUNTER — Other Ambulatory Visit: Payer: Self-pay | Admitting: Gynecology

## 2012-05-25 MED ORDER — SCOPOLAMINE 1 MG/3DAYS TD PT72: 1.0000 | MEDICATED_PATCH | TRANSDERMAL | Status: DC

## 2012-06-14 ENCOUNTER — Ambulatory Visit: Payer: Self-pay

## 2012-06-14 ENCOUNTER — Other Ambulatory Visit: Payer: Self-pay | Admitting: Occupational Medicine

## 2012-06-14 DIAGNOSIS — R52 Pain, unspecified: Secondary | ICD-10-CM

## 2012-07-20 ENCOUNTER — Other Ambulatory Visit (HOSPITAL_COMMUNITY): Payer: Self-pay | Admitting: Sports Medicine

## 2012-07-20 DIAGNOSIS — M25561 Pain in right knee: Secondary | ICD-10-CM

## 2012-07-22 ENCOUNTER — Ambulatory Visit (HOSPITAL_COMMUNITY)
Admission: RE | Admit: 2012-07-22 | Discharge: 2012-07-22 | Disposition: A | Discharge status: Home / Self Care | Payer: PRIVATE HEALTH INSURANCE | Source: Ambulatory Visit | Attending: Sports Medicine | Admitting: Sports Medicine

## 2012-07-22 DIAGNOSIS — M6740 Ganglion, unspecified site: Secondary | ICD-10-CM | POA: Insufficient documentation

## 2012-07-22 DIAGNOSIS — M25561 Pain in right knee: Secondary | ICD-10-CM

## 2012-07-22 DIAGNOSIS — M25569 Pain in unspecified knee: Secondary | ICD-10-CM | POA: Insufficient documentation

## 2012-08-15 ENCOUNTER — Other Ambulatory Visit: Payer: Self-pay | Admitting: Women's Health

## 2012-08-18 ENCOUNTER — Ambulatory Visit: Payer: PRIVATE HEALTH INSURANCE | Admitting: Physical Therapy

## 2012-08-18 ENCOUNTER — Ambulatory Visit: Payer: 59 | Admitting: Rehabilitative and Restorative Service Providers"

## 2012-08-25 ENCOUNTER — Ambulatory Visit: Payer: Self-pay

## 2012-08-29 ENCOUNTER — Ambulatory Visit
Payer: PRIVATE HEALTH INSURANCE | Attending: Internal Medicine | Admitting: Rehabilitative and Restorative Service Providers"

## 2012-08-29 DIAGNOSIS — R262 Difficulty in walking, not elsewhere classified: Secondary | ICD-10-CM | POA: Insufficient documentation

## 2012-08-29 DIAGNOSIS — M25569 Pain in unspecified knee: Secondary | ICD-10-CM | POA: Insufficient documentation

## 2012-08-29 DIAGNOSIS — IMO0001 Reserved for inherently not codable concepts without codable children: Secondary | ICD-10-CM | POA: Insufficient documentation

## 2012-08-31 ENCOUNTER — Ambulatory Visit: Payer: PRIVATE HEALTH INSURANCE | Attending: Sports Medicine | Admitting: Physical Therapy

## 2012-08-31 DIAGNOSIS — IMO0001 Reserved for inherently not codable concepts without codable children: Secondary | ICD-10-CM | POA: Insufficient documentation

## 2012-08-31 DIAGNOSIS — R262 Difficulty in walking, not elsewhere classified: Secondary | ICD-10-CM | POA: Insufficient documentation

## 2012-08-31 DIAGNOSIS — M25569 Pain in unspecified knee: Secondary | ICD-10-CM | POA: Insufficient documentation

## 2012-09-01 ENCOUNTER — Ambulatory Visit: Payer: PRIVATE HEALTH INSURANCE

## 2012-09-05 ENCOUNTER — Ambulatory Visit: Payer: PRIVATE HEALTH INSURANCE | Admitting: Physical Therapy

## 2012-09-07 ENCOUNTER — Ambulatory Visit: Payer: PRIVATE HEALTH INSURANCE | Admitting: Physical Therapy

## 2012-09-12 ENCOUNTER — Ambulatory Visit: Payer: PRIVATE HEALTH INSURANCE | Admitting: Rehabilitation

## 2012-09-14 ENCOUNTER — Ambulatory Visit: Payer: PRIVATE HEALTH INSURANCE | Admitting: Physical Therapy

## 2012-09-19 ENCOUNTER — Encounter: Payer: Self-pay | Admitting: Physical Therapy

## 2012-09-21 ENCOUNTER — Encounter: Payer: Self-pay | Admitting: Physical Therapy

## 2012-09-27 ENCOUNTER — Encounter: Payer: Self-pay | Admitting: Rehabilitation

## 2012-09-29 ENCOUNTER — Encounter: Payer: Self-pay | Admitting: Physical Therapy

## 2012-10-28 ENCOUNTER — Encounter (HOSPITAL_BASED_OUTPATIENT_CLINIC_OR_DEPARTMENT_OTHER): Payer: Self-pay | Admitting: *Deleted

## 2012-10-31 ENCOUNTER — Encounter (HOSPITAL_BASED_OUTPATIENT_CLINIC_OR_DEPARTMENT_OTHER): Payer: Self-pay | Admitting: *Deleted

## 2012-10-31 NOTE — Progress Notes (Addendum)
NPO  AFTER MN WITH EXCEPTION WATER/ GATORADE UNTIL 0700. ARRIVE AT 1100. NEEDS HG AND URINE PREG.  PT STATES DR COLLINS RX VALIUM TO TAKE AM OF SURG, WHICH SHE MAY TAKE W/ SIP OF WATER.  SPOKE W/ PT AND HER CASE WAS RESCHEDULED FOR Monday 11-07-2012 WITH SAME TIME, PT TO FOLLOW SAME INSTRUCTIONS. 

## 2012-11-02 ENCOUNTER — Other Ambulatory Visit: Payer: Self-pay | Admitting: Orthopedic Surgery

## 2012-11-07 ENCOUNTER — Ambulatory Visit (HOSPITAL_BASED_OUTPATIENT_CLINIC_OR_DEPARTMENT_OTHER)
Admission: RE | Admit: 2012-11-07 | Discharge: 2012-11-07 | Disposition: A | Discharge status: Home / Self Care | Payer: PRIVATE HEALTH INSURANCE | Source: Ambulatory Visit | Attending: Specialist | Admitting: Specialist

## 2012-11-07 ENCOUNTER — Encounter (HOSPITAL_BASED_OUTPATIENT_CLINIC_OR_DEPARTMENT_OTHER): Payer: PRIVATE HEALTH INSURANCE | Admitting: Anesthesiology

## 2012-11-07 ENCOUNTER — Ambulatory Visit (HOSPITAL_BASED_OUTPATIENT_CLINIC_OR_DEPARTMENT_OTHER): Payer: PRIVATE HEALTH INSURANCE | Admitting: Anesthesiology

## 2012-11-07 ENCOUNTER — Encounter (HOSPITAL_BASED_OUTPATIENT_CLINIC_OR_DEPARTMENT_OTHER)
Admission: RE | Disposition: A | Discharge status: Home / Self Care | Payer: Self-pay | Source: Ambulatory Visit | Attending: Specialist

## 2012-11-07 ENCOUNTER — Encounter (HOSPITAL_BASED_OUTPATIENT_CLINIC_OR_DEPARTMENT_OTHER): Payer: Self-pay

## 2012-11-07 DIAGNOSIS — M6750 Plica syndrome, unspecified knee: Secondary | ICD-10-CM | POA: Insufficient documentation

## 2012-11-07 DIAGNOSIS — M9420 Chondromalacia, unspecified site: Secondary | ICD-10-CM | POA: Insufficient documentation

## 2012-11-07 DIAGNOSIS — M2240 Chondromalacia patellae, unspecified knee: Secondary | ICD-10-CM | POA: Insufficient documentation

## 2012-11-07 DIAGNOSIS — M1711 Unilateral primary osteoarthritis, right knee: Secondary | ICD-10-CM

## 2012-11-07 HISTORY — DX: Unspecified internal derangement of right knee: M23.91

## 2012-11-07 HISTORY — PX: KNEE ARTHROSCOPY: SHX127

## 2012-11-07 LAB — POCT HEMOGLOBIN-HEMACUE: Hemoglobin: 12.8 g/dL (ref 12.0–15.0)

## 2012-11-07 SURGERY — ARTHROSCOPY, KNEE
Anesthesia: Monitor Anesthesia Care | Site: Knee | Laterality: Right | Wound class: Clean

## 2012-11-07 MED ORDER — PROPOFOL 10 MG/ML IV BOLUS
INTRAVENOUS | Status: DC | PRN
  Administered 2012-11-07: 200 mg via INTRAVENOUS

## 2012-11-07 MED ORDER — LIDOCAINE HCL (CARDIAC) 20 MG/ML IV SOLN
INTRAVENOUS | Status: DC | PRN
  Administered 2012-11-07: 60 mg via INTRAVENOUS

## 2012-11-07 MED ORDER — LACTATED RINGERS IV SOLN
INTRAVENOUS | Status: DC
  Administered 2012-11-07: 12:00:00 via INTRAVENOUS
  Filled 2012-11-07: qty 1000

## 2012-11-07 MED ORDER — FENTANYL CITRATE 0.05 MG/ML IJ SOLN
INTRAMUSCULAR | Status: DC | PRN
  Administered 2012-11-07 (×2): 25 ug via INTRAVENOUS
  Administered 2012-11-07 (×2): 50 ug via INTRAVENOUS

## 2012-11-07 MED ORDER — SODIUM CHLORIDE 0.9 % IR SOLN
Status: DC | PRN
  Administered 2012-11-07: 9000 mL

## 2012-11-07 MED ORDER — BUPIVACAINE HCL 0.25 % IJ SOLN
INTRAMUSCULAR | Status: DC | PRN
  Administered 2012-11-07: 30 mL

## 2012-11-07 MED ORDER — LACTATED RINGERS IV SOLN
INTRAVENOUS | Status: DC
  Filled 2012-11-07: qty 1000

## 2012-11-07 MED ORDER — CEPHALEXIN 500 MG PO CAPS: 500.0000 mg | ORAL_CAPSULE | Freq: Three times a day (TID) | ORAL | Status: DC

## 2012-11-07 MED ORDER — ASPIRIN EC 325 MG PO TBEC: 325.0000 mg | DELAYED_RELEASE_TABLET | Freq: Two times a day (BID) | ORAL | Status: DC

## 2012-11-07 MED ORDER — ONDANSETRON HCL 4 MG/2ML IJ SOLN
INTRAMUSCULAR | Status: DC | PRN
  Administered 2012-11-07: 4 mg via INTRAMUSCULAR

## 2012-11-07 MED ORDER — SODIUM CHLORIDE 0.9 % IV SOLN
INTRAVENOUS | Status: DC
  Filled 2012-11-07: qty 1000

## 2012-11-07 MED ORDER — DEXAMETHASONE SODIUM PHOSPHATE 4 MG/ML IJ SOLN
INTRAMUSCULAR | Status: DC | PRN
  Administered 2012-11-07: 8 mg via INTRAVENOUS

## 2012-11-07 MED ORDER — FENTANYL CITRATE 0.05 MG/ML IJ SOLN
25.0000 ug | INTRAMUSCULAR | Status: DC | PRN
  Administered 2012-11-07 (×3): 25 ug via INTRAVENOUS
  Filled 2012-11-07: qty 1

## 2012-11-07 MED ORDER — POVIDONE-IODINE 7.5 % EX SOLN
Freq: Once | CUTANEOUS | Status: DC
  Filled 2012-11-07: qty 118

## 2012-11-07 MED ORDER — MIDAZOLAM HCL 5 MG/5ML IJ SOLN
INTRAMUSCULAR | Status: DC | PRN
  Administered 2012-11-07: 2 mg via INTRAVENOUS

## 2012-11-07 MED ORDER — HYDROCODONE-ACETAMINOPHEN 5-325 MG PO TABS: 1.0000 | ORAL_TABLET | ORAL | Status: DC | PRN

## 2012-11-07 MED ORDER — CEFAZOLIN SODIUM-DEXTROSE 2-3 GM-% IV SOLR
2.0000 g | INTRAVENOUS | Status: AC
  Administered 2012-11-07: 2 g via INTRAVENOUS
  Filled 2012-11-07: qty 50

## 2012-11-07 MED ORDER — HYDROCODONE-ACETAMINOPHEN 5-325 MG PO TABS
1.0000 | ORAL_TABLET | ORAL | Status: DC | PRN
  Administered 2012-11-07: 1 via ORAL
  Filled 2012-11-07: qty 1

## 2012-11-07 MED ORDER — MORPHINE SULFATE 4 MG/ML IJ SOLN
INTRAMUSCULAR | Status: DC | PRN
  Administered 2012-11-07: 4 mg via INTRAVENOUS

## 2012-11-07 SURGICAL SUPPLY — 51 items
BANDAGE ELASTIC 6 VELCRO ST LF (GAUZE/BANDAGES/DRESSINGS) ×2 IMPLANT
BANDAGE ESMARK 6X9 LF (GAUZE/BANDAGES/DRESSINGS) ×1 IMPLANT
BANDAGE GAUZE ELAST BULKY 4 IN (GAUZE/BANDAGES/DRESSINGS) ×4 IMPLANT
BLADE 4.2CUDA (BLADE) IMPLANT
BLADE CUDA GRT WHITE 3.5 (BLADE) ×2 IMPLANT
BLADE CUDA SHAVER 3.5 (BLADE) IMPLANT
BNDG ESMARK 6X9 LF (GAUZE/BANDAGES/DRESSINGS) ×2
CANISTER SUCT LVC 12 LTR MEDI- (MISCELLANEOUS) ×2 IMPLANT
CANISTER SUCTION 1200CC (MISCELLANEOUS) ×2 IMPLANT
CLOTH BEACON ORANGE TIMEOUT ST (SAFETY) ×2 IMPLANT
DRAPE ARTHROSCOPY W/POUCH 114 (DRAPES) ×2 IMPLANT
DRAPE INCISE 23X17 IOBAN STRL (DRAPES) ×1
DRAPE INCISE IOBAN 23X17 STRL (DRAPES) ×1 IMPLANT
DRAPE INCISE IOBAN 66X45 STRL (DRAPES) IMPLANT
DRSG PAD ABDOMINAL 8X10 ST (GAUZE/BANDAGES/DRESSINGS) ×2 IMPLANT
DURAPREP 26ML APPLICATOR (WOUND CARE) ×2 IMPLANT
ELECT MENISCUS 165MM 90D (ELECTRODE) IMPLANT
ELECT REM PT RETURN 9FT ADLT (ELECTROSURGICAL)
ELECTRODE REM PT RTRN 9FT ADLT (ELECTROSURGICAL) IMPLANT
GAUZE XEROFORM 1X8 LF (GAUZE/BANDAGES/DRESSINGS) ×2 IMPLANT
GLOVE BIO SURGEON STRL SZ7.5 (GLOVE) ×2 IMPLANT
GLOVE INDICATOR 8.0 STRL GRN (GLOVE) ×4 IMPLANT
GLOVE SKINSENSE NS SZ6.5 (GLOVE) ×1
GLOVE SKINSENSE NS SZ7.5 (GLOVE) ×1
GLOVE SKINSENSE NS SZ8.0 LF (GLOVE) ×1
GLOVE SKINSENSE STRL SZ6.5 (GLOVE) ×1 IMPLANT
GLOVE SKINSENSE STRL SZ7.5 (GLOVE) ×1 IMPLANT
GLOVE SKINSENSE STRL SZ8.0 LF (GLOVE) ×1 IMPLANT
GLOVE SURG ORTHO 8.0 STRL STRW (GLOVE) ×4 IMPLANT
GOWN PREVENTION PLUS LG XLONG (DISPOSABLE) ×4 IMPLANT
GOWN STRL REIN XL XLG (GOWN DISPOSABLE) ×2 IMPLANT
IMMOBILIZER KNEE 22 UNIV (SOFTGOODS) IMPLANT
IMMOBILIZER KNEE 24 THIGH 36 (MISCELLANEOUS) IMPLANT
IMMOBILIZER KNEE 24 UNIV (MISCELLANEOUS)
IV NS IRRIG 3000ML ARTHROMATIC (IV SOLUTION) ×6 IMPLANT
KNEE WRAP E Z 3 GEL PACK (MISCELLANEOUS) ×2 IMPLANT
MINI VAC (SURGICAL WAND) ×2 IMPLANT
PACK ARTHROSCOPY DSU (CUSTOM PROCEDURE TRAY) ×2 IMPLANT
PACK BASIN DAY SURGERY FS (CUSTOM PROCEDURE TRAY) ×2 IMPLANT
PADDING CAST ABS 4INX4YD NS (CAST SUPPLIES) ×1
PADDING CAST ABS COTTON 4X4 ST (CAST SUPPLIES) ×1 IMPLANT
PENCIL BUTTON HOLSTER BLD 10FT (ELECTRODE) IMPLANT
SET ARTHROSCOPY TUBING (MISCELLANEOUS) ×2
SET ARTHROSCOPY TUBING LN (MISCELLANEOUS) ×1 IMPLANT
SPONGE GAUZE 4X4 12PLY (GAUZE/BANDAGES/DRESSINGS) ×2 IMPLANT
SUT ETHILON 3 0 PS 1 (SUTURE) ×2 IMPLANT
SYR CONTROL 10ML LL (SYRINGE) ×2 IMPLANT
TOWEL OR 17X24 6PK STRL BLUE (TOWEL DISPOSABLE) ×2 IMPLANT
WAND 30 DEG SABER W/CORD (SURGICAL WAND) IMPLANT
WAND 90 DEG TURBOVAC W/CORD (SURGICAL WAND) IMPLANT
WATER STERILE IRR 500ML POUR (IV SOLUTION) ×2 IMPLANT

## 2012-11-07 NOTE — Interval H&P Note (Signed)
History and Physical Interval Note:  11/07/2012 1:26 PM  [name]  has presented today for surgery, with the diagnosis of RIGHT KNEE INTERNAL DERANGEMENT CHONDROMALACIA  PLICA  The various methods of treatment have been discussed with the patient and family. After consideration of risks, benefits and other options for treatment, the patient has consented to  Procedure(s): RIGHT KNEE ARTHROSCOPY PLICA RESECTION DEBRIDEMENT AND CHONDROPLASTY  (Right) as a surgical intervention .  The patient's history has been reviewed, patient examined, no change in status, stable for surgery.  I have reviewed the patient's chart and labs.  Questions were answered to the patient's satisfaction.     Melissa Torres ANDREW

## 2012-11-07 NOTE — Op Note (Signed)
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 Assistant Marciano Sequin PA-C Anesthesia Gen. with intraoperative knee block estimated blood loss less than 10 cc drains none complications none disposition PACU stable Operative details Patient was counseled in the holding area cracks I. was marked. Taken operating room. TED hose had been applied uninvolved leg. Placed on the OR bed supine position general anesthesia. Right posterior thigh holder prepped with DuraPrep and draped into a sterile fashion. Exsanguinated with Esmarch and tourniquet inflated 250 mm mercury Tom out had been done prior to this. Arthroscopic portals were established proximal medial inferomedial and inferolateral diagnostic of the revealed grade 2, his apex and patella facet with mild lateral tilting no prescription subluxation or. Totally unremarkable. Medial shelf an infrapatellar plica were present. There is she was induced plicae were resected in their entirety.  Chondroplasty was performed the patella mechanical shaver back to smooth edge. Anterior cruciate ligament and PCL were intact medial compartment inspected normal articular cartilage normal medial meniscus medial lateral gutters otherwise unremarkable souped L. pouch unremarkable lateral side inspected lateral femoral condyle unremarkable, lateral meniscus intact, grade 2, show to plateau with unstable flaps mechanical chondroplasty was performed shaver. Knee was sequentially reinspected no that amount is noted irrigated also Colquitt was removed. 3 portals closed one suture. 10 cc of 0.25% Marcaine was placed in  skin and 20 cc of 0.2 pouch and Marcaine with 4 mg of morphine is injected intra-articular. There compressive dressing was applied TED hose and ice pack. Patient tolerated procedure well no complications or problems she will be stabilized and PAC unit discharge to home.

## 2012-11-07 NOTE — Anesthesia Preprocedure Evaluation (Signed)
Anesthesia Evaluation  Patient identified by MRN, date of birth, ID band Patient awake    Reviewed: Allergy & Precautions, H&P , NPO status , Patient's Chart, lab work & pertinent test results  Airway Mallampati: II TM Distance: >3 FB Neck ROM: full    Dental no notable dental hx. (+) Teeth Intact and Dental Advisory Given   Pulmonary neg pulmonary ROS,  breath sounds clear to auscultation  Pulmonary exam normal       Cardiovascular Exercise Tolerance: Good negative cardio ROS  Rhythm:regular Rate:Normal     Neuro/Psych negative neurological ROS  negative psych ROS   GI/Hepatic negative GI ROS, Neg liver ROS,   Endo/Other  negative endocrine ROS  Renal/GU negative Renal ROS  negative genitourinary   Musculoskeletal   Abdominal   Peds  Hematology negative hematology ROS (+)   Anesthesia Other Findings   Reproductive/Obstetrics negative OB ROS                           Anesthesia Physical Anesthesia Plan  ASA: I  Anesthesia Plan: MAC   Post-op Pain Management:    Induction:   Airway Management Planned: Simple Face Mask  Additional Equipment:   Intra-op Plan:   Post-operative Plan:   Informed Consent: I have reviewed the patients History and Physical, chart, labs and discussed the procedure including the risks, benefits and alternatives for the proposed anesthesia with the patient or authorized representative who has indicated his/her understanding and acceptance.   Dental Advisory Given  Plan Discussed with: CRNA and Surgeon  Anesthesia Plan Comments:         Anesthesia Quick Evaluation  

## 2012-11-07 NOTE — H&P (View-Only) (Signed)
NPO  AFTER MN WITH EXCEPTION WATER/ GATORADE UNTIL 0700. ARRIVE AT 1100. NEEDS HG AND URINE PREG.  PT STATES DR Thomasena Edis RX VALIUM TO TAKE AM OF SURG, WHICH SHE MAY TAKE W/ SIP OF WATER.  SPOKE W/ PT AND HER CASE WAS RESCHEDULED FOR Monday 11-07-2012 WITH SAME TIME, PT TO FOLLOW SAME INSTRUCTIONS.

## 2012-11-07 NOTE — H&P (Signed)
Melissa Torres is an 27 y.o. female.   Chief Complaint: Right knee pain HPI: Patient presents with right knee discomfort to [facility]. Despite conservative treatments she continues to have symptoms. Surgical discussion then followed. Risk and benefits were discussed in detail. Patient wishes to proceed. She will under go right arthroscopy for plica resection and chondroplasty. Denies CP, Calf pain, or SOB. Family at bedside.  Past Medical History  Diagnosis Date  . Hypoglycemia   . Internal derangement of right knee     Past Surgical History  Procedure Laterality Date  . Tonsillectomy and adenoidectomy  AGE 52    Family History  Problem Relation Age of Onset  . Hypertension Mother   . Diabetes Mother   . Hypertension Maternal Grandmother   . Diabetes Maternal Grandfather    Social History:  reports that she has never smoked. She has never used smokeless tobacco. She reports that she drinks alcohol. She reports that she does not use illicit drugs.  Allergies:  Allergies  Allergen Reactions  . Ivp Dye [Iodinated Diagnostic Agents] Anaphylaxis, Hives and Swelling  . Latex Itching and Rash    SEVERE ITCHING    Medications Prior to Admission  Medication Sig Dispense Refill  . ibuprofen (ADVIL,MOTRIN) 600 MG tablet Take 1 tablet (600 mg total) by mouth every 8 (eight) hours as needed for pain or fever.  30 tablet  0  . Multiple Vitamin (MULTIVITAMIN) tablet Take 1 tablet by mouth daily.      . norgestimate-ethinyl estradiol (SPRINTEC 28) 0.25-35 MG-MCG tablet TAKE 1 TABLET BY MOUTH DAILY.  28 tablet  7    Results for orders placed during the hospital encounter of 11/07/12 (from the past 48 hour(s))  POCT PREGNANCY, URINE     Status: None   Collection Time    11/07/12 11:05 AM      Result Value Range   Preg Test, Ur NEGATIVE  NEGATIVE   Comment:            THE SENSITIVITY OF THIS     METHODOLOGY IS >24 mIU/mL  POCT HEMOGLOBIN-HEMACUE     Status: None   Collection  Time    11/07/12 11:45 AM      Result Value Range   Hemoglobin 12.8  12.0 - 15.0 g/dL   No results found.  Review of Systems  Constitutional: Negative.   HENT: Negative.   Eyes: Negative.   Respiratory: Negative.   Cardiovascular: Negative.   Gastrointestinal: Negative.   Genitourinary: Negative.   Musculoskeletal: Positive for joint pain.  Skin: Negative.   Neurological: Negative.   Endo/Heme/Allergies: Negative.   Psychiatric/Behavioral: Negative.     Blood pressure 118/77, pulse 78, temperature 98.5 F (36.9 C), temperature source Oral, resp. rate 16, height 5\' 7"  (1.702 m), weight 88.225 kg (194 lb 8 oz), last menstrual period 10/10/2012, SpO2 98.00%. Physical Exam  Constitutional: She is oriented to person, place, and time. She appears well-developed and well-nourished.  HENT:  Head: Normocephalic.  Eyes: EOM are normal.  Neck: Normal range of motion.  Cardiovascular: Normal rate and regular rhythm.   Respiratory: Effort normal.  GI: Soft.  Genitourinary:  Deferred  Musculoskeletal: She exhibits tenderness.  Pain right knee  Neurological: She is alert and oriented to person, place, and time.  Skin: Skin is warm and dry.  Psychiatric: Her behavior is normal.     Assessment/Plan Right knee OP surgery for Arthroscopy, debridement, chondroplasty, and plica resection D/C home with family support Take medications  as directed Follow D/C instructions All questions were encouraged and answered in detail.  Mabry Santarelli L 11/07/2012, 1:11 PM

## 2012-11-07 NOTE — Transfer of Care (Signed)
Immediate Anesthesia Transfer of Care Note  Patient: Melissa Torres  Procedure(s) Performed: Procedure(s) (LRB): RIGHT KNEE ARTHROSCOPY PLICA RESECTION DEBRIDEMENT AND CHONDROPLASTY  (Right)  Patient Location: PACU  Anesthesia Type: General  Level of Consciousness: awake, oriented, sedated and patient cooperative  Airway & Oxygen Therapy: Patient Spontanous Breathing and Patient connected to face mask oxygen  Post-op Assessment: Report given to PACU RN and Post -op Vital signs reviewed and stable  Post vital signs: Reviewed and stable  Complications: No apparent anesthesia complications

## 2012-11-07 NOTE — Anesthesia Postprocedure Evaluation (Signed)
Anesthesia Post Note  Patient: Melissa Torres  Procedure(s) Performed: Procedure(s) (LRB): RIGHT KNEE ARTHROSCOPY PLICA RESECTION DEBRIDEMENT AND CHONDROPLASTY  (Right)  Anesthesia type: General  Patient location: PACU  Post pain: Pain level controlled  Post assessment: Post-op Vital signs reviewed  Last Vitals: BP 97/71  Pulse 67  Temp(Src) 37 C (Oral)  Resp 18  Ht 5\' 7"  (1.702 m)  Wt 194 lb 8 oz (88.225 kg)  BMI 30.46 kg/m2  SpO2 98%  LMP 10/10/2012  Post vital signs: Reviewed  Level of consciousness: sedated  Complications: No apparent anesthesia complications

## 2012-11-07 NOTE — Anesthesia Procedure Notes (Signed)
Procedure Name: LMA Insertion Date/Time: 11/07/2012 1:39 PM Performed by: Renella Cunas D Pre-anesthesia Checklist: Patient identified, Emergency Drugs available, Suction available and Patient being monitored Patient Re-evaluated:Patient Re-evaluated prior to inductionOxygen Delivery Method: Circle System Utilized Preoxygenation: Pre-oxygenation with 100% oxygen Intubation Type: IV induction Ventilation: Mask ventilation without difficulty LMA: LMA inserted LMA Size: 4.0 Number of attempts: 1 Airway Equipment and Method: bite block Placement Confirmation: positive ETCO2 Tube secured with: Tape Dental Injury: Teeth and Oropharynx as per pre-operative assessment

## 2012-11-08 ENCOUNTER — Encounter (HOSPITAL_BASED_OUTPATIENT_CLINIC_OR_DEPARTMENT_OTHER): Payer: Self-pay | Admitting: Specialist

## 2012-11-21 ENCOUNTER — Encounter: Payer: Self-pay | Admitting: Specialist

## 2012-11-26 ENCOUNTER — Encounter: Payer: Self-pay | Admitting: Specialist

## 2012-12-01 ENCOUNTER — Other Ambulatory Visit: Payer: Self-pay

## 2012-12-26 ENCOUNTER — Encounter: Payer: Self-pay | Admitting: Specialist

## 2013-01-26 ENCOUNTER — Encounter: Payer: Self-pay | Admitting: Specialist

## 2013-02-26 ENCOUNTER — Encounter: Payer: Self-pay | Admitting: Specialist

## 2013-03-25 ENCOUNTER — Other Ambulatory Visit: Payer: Self-pay | Admitting: Women's Health

## 2013-04-26 ENCOUNTER — Other Ambulatory Visit: Payer: Self-pay | Admitting: Women's Health

## 2013-05-08 ENCOUNTER — Encounter (HOSPITAL_COMMUNITY): Payer: Self-pay | Admitting: Emergency Medicine

## 2013-05-08 ENCOUNTER — Emergency Department (HOSPITAL_COMMUNITY)
Admission: EM | Admit: 2013-05-08 | Discharge: 2013-05-08 | Disposition: A | Discharge status: Home / Self Care | Payer: 59 | Source: Home / Self Care | Attending: Emergency Medicine | Admitting: Emergency Medicine

## 2013-05-08 DIAGNOSIS — H9209 Otalgia, unspecified ear: Secondary | ICD-10-CM

## 2013-05-08 DIAGNOSIS — J309 Allergic rhinitis, unspecified: Secondary | ICD-10-CM

## 2013-05-08 LAB — POCT PREGNANCY, URINE: PREG TEST UR: NEGATIVE

## 2013-05-08 MED ORDER — CHLORPHENIRAMINE-PSE-IBUPROFEN 2-30-200 MG PO TABS: ORAL_TABLET | ORAL | Status: DC

## 2013-05-08 MED ORDER — FLUTICASONE PROPIONATE 50 MCG/ACT NA SUSP: 2.0000 | Freq: Two times a day (BID) | NASAL | Status: DC

## 2013-05-08 MED ORDER — AMOXICILLIN 875 MG PO TABS: 875.0000 mg | ORAL_TABLET | Freq: Two times a day (BID) | ORAL | Status: DC

## 2013-05-08 NOTE — ED Provider Notes (Signed)
CSN: [id number]     Arrival date & time 05/08/13  [id number] History   First MD Initiated Contact with Patient 05/08/13 1117     Chief Complaint  Patient presents with  . Otalgia  . Allergies   (Consider location/radiation/quality/duration/timing/severity/associated sxs/prior Treatment) HPI Comments: 28 year old female presents complaining of 3 days of sinus pressure, nasal congestion, dry cough, now with severe and worsening left ear pain, chest pain with coughing. Her symptoms have been gradually worsening since they began 3 days ago. She has taken over-the-counter medications with only mild relief of her symptoms. She notes that she is trying to get pregnant also but she does not know if she is pregnant. She denies fever, chills, NVD, shortness of breath.  Patient is a 28 y.o. female presenting with ear pain.  Otalgia Associated symptoms: congestion, cough and rhinorrhea   Associated symptoms: no fever     Past Medical History  Diagnosis Date  . Hypoglycemia   . Internal derangement of right knee    Past Surgical History  Procedure Laterality Date  . Tonsillectomy and adenoidectomy  AGE 75  . Knee arthroscopy Right 11/07/2012    Procedure: RIGHT KNEE ARTHROSCOPY PLICA RESECTION DEBRIDEMENT AND CHONDROPLASTY ;  Surgeon: Eugenia Mcalpineobert Collins, MD;  [facility];  Service: Orthopedics;  Laterality: Right;   Family History  Problem Relation Age of Onset  . Hypertension Mother   . Diabetes Mother   . Hypertension Maternal Grandmother   . Diabetes Maternal Grandfather    History  Substance Use Topics  . Smoking status: Never Smoker   . Smokeless tobacco: Never Used  . Alcohol Use: Yes     Comment: occassionally   OB History   Grav Para Term Preterm Abortions TAB SAB Ect Mult Living   0              Review of Systems  Constitutional: Negative for fever and chills.  HENT: Positive for congestion, ear pain, rhinorrhea and sinus pressure.   Respiratory: Positive  for cough and chest tightness. Negative for shortness of breath.   All other systems reviewed and are negative.   Allergies  Ivp dye and Latex  Home Medications   Current Outpatient Rx  Name  Route  Sig  Dispense  Refill  . SPRINTEC 28 0.25-35 MG-MCG tablet      TAKE 1 TABLET BY MOUTH DAILY.   28 tablet   0     Past due for annual exam. Appt needed for addt'l r ...   . amoxicillin (AMOXIL) 875 MG tablet   Oral   Take 1 tablet (875 mg total) by mouth 2 (two) times daily.   14 tablet   0   . aspirin EC 325 MG tablet   Oral   Take 1 tablet (325 mg total) by mouth 2 (two) times daily.   60 tablet   0   . cephALEXin (KEFLEX) 500 MG capsule   Oral   Take 1 capsule (500 mg total) by mouth 3 (three) times daily.   12 capsule   0   . Chlorpheniramine-PSE-Ibuprofen (ADVIL ALLERGY SINUS) 2-30-200 MG TABS      1-2 tabs PO Q4-6 hrs PRN   50 each   1   . fluticasone (FLONASE) 50 MCG/ACT nasal spray   Each Nare   Place 2 sprays into both nostrils 2 (two) times daily. Decrease to 2 sprays/nostril daily after 5 days   16 g   2   . HYDROcodone-acetaminophen (NORCO)  5-325 MG per tablet   Oral   Take 1 tablet by mouth every 4 (four) hours as needed for pain.   60 tablet   0   . ibuprofen (ADVIL,MOTRIN) 600 MG tablet   Oral   Take 1 tablet (600 mg total) by mouth every 8 (eight) hours as needed for pain or fever.   30 tablet   0   . Multiple Vitamin (MULTIVITAMIN) tablet   Oral   Take 1 tablet by mouth daily.          BP 118/71  Pulse 94  Temp(Src) 98.7 F (37.1 C) (Oral)  Resp 16  SpO2 99%  LMP 04/10/2013 Physical Exam  Nursing note and vitals reviewed. Constitutional: She is oriented to person, place, and time. Vital signs are normal. She appears well-developed and well-nourished. No distress.  HENT:  Head: Normocephalic and atraumatic.  Right Ear: Tympanic membrane is retracted.  Left Ear: Tympanic membrane is injected, erythematous and retracted.   Nose: Mucosal edema present. Right sinus exhibits no maxillary sinus tenderness and no frontal sinus tenderness. Left sinus exhibits no maxillary sinus tenderness and no frontal sinus tenderness.  Mouth/Throat: Uvula is midline, oropharynx is clear and moist and mucous membranes are normal. No oropharyngeal exudate or posterior oropharyngeal erythema.  Neck: Normal range of motion.  Cardiovascular: Normal rate, regular rhythm and normal heart sounds.  Exam reveals no gallop and no friction rub.   No murmur heard. Pulmonary/Chest: Effort normal and breath sounds normal. No respiratory distress. She has no wheezes. She has no rales.  Lymphadenopathy:       Head (right side): Tonsillar adenopathy present.       Head (left side): Tonsillar adenopathy present.    She has no cervical adenopathy.  Neurological: She is alert and oriented to person, place, and time. She has normal strength. Coordination normal.  Skin: Skin is warm and dry. No rash noted. She is not diaphoretic.  Psychiatric: She has a normal mood and affect. Judgment normal.    ED Course  Procedures (including critical care time) Labs Review Labs Reviewed  POCT PREGNANCY, URINE   Imaging Review No results found.   MDM   1. Allergic rhinosinusitis   2. Otalgia    Will treat symptomatically for a few days, post-dated Rx for amoxicillin.  F/U PRN if not improving with amoxicillin    Meds ordered this encounter  Medications  . Chlorpheniramine-PSE-Ibuprofen (ADVIL ALLERGY SINUS) 2-30-200 MG TABS    Sig: 1-2 tabs PO Q4-6 hrs PRN    Dispense:  50 each    Refill:  1    Order Specific Question:  Supervising Provider    Answer:  Lorenz CoasterKELLER, DAVID C [id number]]  . fluticasone (FLONASE) 50 MCG/ACT nasal spray    Sig: Place 2 sprays into both nostrils 2 (two) times daily. Decrease to 2 sprays/nostril daily after 5 days    Dispense:  16 g    Refill:  2    Order Specific Question:  Supervising Provider    Answer:  Lorenz CoasterKELLER, DAVID C  [id number]]  . amoxicillin (AMOXIL) 875 MG tablet    Sig: Take 1 tablet (875 mg total) by mouth 2 (two) times daily.    Dispense:  14 tablet    Refill:  0    Order Specific Question:  Supervising Provider    Answer:  Lorenz CoasterKELLER, DAVID C [[id number]]       Graylon GoodZachary H Kalee Mcclenathan, PA-C 05/08/13 1200

## 2013-05-08 NOTE — ED Notes (Signed)
C/o  Sinus pressure and pain.  Bilateral ear pain.  States pain is worse in left ear.  Runny/stuffy nose.  Chest congestion.  Nonproductive cough.  Mild relief with otc meds.

## 2013-05-08 NOTE — ED Provider Notes (Signed)
Medical screening examination/treatment/procedure(s) were performed by non-physician practitioner and as supervising physician I was immediately available for consultation/collaboration.  Naftuli Dalsanto, M.D.  Teila Skalsky C Aveer Bartow, MD 05/08/13 1627 

## 2013-05-08 NOTE — Discharge Instructions (Signed)
Allergic Rhinitis °Allergic rhinitis is when the mucous membranes in the nose respond to allergens. Allergens are particles in the air that cause your body to have an allergic reaction. This causes you to release allergic antibodies. Through a chain of events, these eventually cause you to release histamine into the blood stream. Although meant to protect the body, it is this release of histamine that causes your discomfort, such as frequent sneezing, congestion, and an itchy, runny nose.  °CAUSES  °Seasonal allergic rhinitis (hay fever) is caused by pollen allergens that may come from grasses, trees, and weeds. Year-round allergic rhinitis (perennial allergic rhinitis) is caused by allergens such as house dust mites, pet dander, and mold spores.  °SYMPTOMS  °· Nasal stuffiness (congestion). °· Itchy, runny nose with sneezing and tearing of the eyes. °DIAGNOSIS  °Your health care provider can help you determine the allergen or allergens that trigger your symptoms. If you and your health care provider are unable to determine the allergen, skin or blood testing may be used. °TREATMENT  °Allergic Rhinitis does not have a cure, but it can be controlled by: °· Medicines and allergy shots (immunotherapy). °· Avoiding the allergen. °Hay fever may often be treated with antihistamines in pill or nasal spray forms. Antihistamines block the effects of histamine. There are over-the-counter medicines that may help with nasal congestion and swelling around the eyes. Check with your health care provider before taking or giving this medicine.  °If avoiding the allergen or the medicine prescribed do not work, there are many new medicines your health care provider can prescribe. Stronger medicine may be used if initial measures are ineffective. Desensitizing injections can be used if medicine and avoidance does not work. Desensitization is when a patient is given ongoing shots until the body becomes less sensitive to the allergen.  Make sure you follow up with your health care provider if problems continue. °HOME CARE INSTRUCTIONS °It is not possible to completely avoid allergens, but you can reduce your symptoms by taking steps to limit your exposure to them. It helps to know exactly what you are allergic to so that you can avoid your specific triggers. °SEEK MEDICAL CARE IF:  °· You have a fever. °· You develop a cough that does not stop easily (persistent). °· You have shortness of breath. °· You start wheezing. °· Symptoms interfere with normal daily activities. °Document Released: 10/07/2000 Document Revised: 11/02/2012 Document Reviewed: 09/19/2012 °Exit[employer]® Patient Information ©2014 [employer]. ° °Hay Fever °Hay fever is an allergic reaction to particles in the air. It cannot be passed from person to person. It cannot be cured, but it can be controlled. °CAUSES  °Hay fever is caused by something that triggers an allergic reaction (allergens). The following are examples of allergens: °· Ragweed. °· Feathers. °· Animal dander. °· Grass and tree pollens. °· Cigarette smoke. °· House dust. °· Pollution. °SYMPTOMS  °· Sneezing. °· Runny or stuffy nose. °· Tearing eyes. °· Itchy eyes, nose, mouth, throat, skin, or other area. °· Sore throat. °· Headache. °· Decreased sense of smell or taste. °DIAGNOSIS °Your caregiver will perform a physical exam and ask questions about the symptoms you are having. Allergy testing may be done to determine exactly what triggers your hay fever.   °TREATMENT  °· Over-the-counter medicines may help symptoms. These include: °· Antihistamines. °· Decongestants. These may help with nasal congestion. °· Your caregiver may prescribe medicines if over-the-counter medicines do not work. °· Some people benefit from allergy shots when other medicines are   not helpful. HOME CARE INSTRUCTIONS   Avoid the allergen that is causing your symptoms, if possible.  Take all medicine as told by your caregiver. SEEK MEDICAL  CARE IF:   You have severe allergy symptoms and your current medicines are not helping.  Your treatment was working at one time, but you are now experiencing symptoms.  You have sinus congestion and pressure.  You develop a fever or headache.  You have thick nasal discharge.  You have asthma and have a worsening cough and wheezing. SEEK IMMEDIATE MEDICAL CARE IF:   You have swelling of your tongue or lips.  You have trouble breathing.  You feel lightheaded or like you are going to faint.  You have cold sweats.  You have a fever. Document Released: 01/12/2005 Document Revised: 04/06/2011 Document Reviewed: 04/09/2010 [facility] Patient Information 2014 [place], [place].  Otalgia The most common reason for this in children is an infection of the middle ear. Pain from the middle ear is usually caused by a build-up of fluid and pressure behind the eardrum. Pain from an earache can be sharp, dull, or burning. The pain may be temporary or constant. The middle ear is connected to the nasal passages by a short narrow tube called the Eustachian tube. The Eustachian tube allows fluid to drain out of the middle ear, and helps keep the pressure in your ear equalized. CAUSES  A cold or allergy can block the Eustachian tube with inflammation and the build-up of secretions. This is especially likely in small children, because their Eustachian tube is shorter and more horizontal. When the Eustachian tube closes, the normal flow of fluid from the middle ear is stopped. Fluid can accumulate and cause stuffiness, pain, hearing loss, and an ear infection if germs start growing in this area. SYMPTOMS  The symptoms of an ear infection may include fever, ear pain, fussiness, increased crying, and irritability. Many children will have temporary and minor hearing loss during and right after an ear infection. Permanent hearing loss is rare, but the risk increases the more infections a child has. Other causes of  ear pain include retained water in the outer ear canal from swimming and bathing. Ear pain in adults is less likely to be from an ear infection. Ear pain may be referred from other locations. Referred pain may be from the joint between your jaw and the skull. It may also come from a tooth problem or problems in the neck. Other causes of ear pain include:  A foreign body in the ear.  Outer ear infection.  Sinus infections.  Impacted ear wax.  Ear injury.  Arthritis of the jaw or TMJ problems.  Middle ear infection.  Tooth infections.  Sore throat with pain to the ears. DIAGNOSIS  Your caregiver can usually make the diagnosis by examining you. Sometimes other special studies, including x-rays and lab work may be necessary. TREATMENT   If antibiotics were prescribed, use them as directed and finish them even if you or your child's symptoms seem to be improved.  Sometimes PE tubes are needed in children. These are little plastic tubes which are put into the eardrum during a simple surgical procedure. They allow fluid to drain easier and allow the pressure in the middle ear to equalize. This helps relieve the ear pain caused by pressure changes. HOME CARE INSTRUCTIONS   Only take over-the-counter or prescription medicines for pain, discomfort, or fever as directed by your caregiver. DO NOT GIVE CHILDREN ASPIRIN because of the association  of Reye's Syndrome in children taking aspirin.  Use a cold pack applied to the outer ear for 15-20 minutes, 03-04 times per day or as needed may reduce pain. Do not apply ice directly to the skin. You may cause frost bite.  Over-the-counter ear drops used as directed may be effective. Your caregiver may sometimes prescribe ear drops.  Resting in an upright position may help reduce pressure in the middle ear and relieve pain.  Ear pain caused by rapidly descending from high altitudes can be relieved by swallowing or chewing gum. Allowing infants to  suck on a bottle during airplane travel can help.  Do not smoke in the house or near children. If you are unable to quit smoking, smoke outside.  Control allergies. SEEK IMMEDIATE MEDICAL CARE IF:   You or your child are becoming sicker.  Pain or fever relief is not obtained with medicine.  You or your child's symptoms (pain, fever, or irritability) do not improve within 24 to 48 hours or as instructed.  Severe pain suddenly stops hurting. This may indicate a ruptured eardrum.  You or your children develop new problems such as severe headaches, stiff neck, difficulty swallowing, or swelling of the face or around the ear. Document Released: 08/30/2003 Document Revised: 04/06/2011 Document Reviewed: 01/04/2008 [facility] Patient Information 2014 [place]ExitCare, [place]LL[place].

## 2013-05-27 ENCOUNTER — Other Ambulatory Visit: Payer: Self-pay | Admitting: Women's Health

## 2013-06-26 ENCOUNTER — Other Ambulatory Visit: Payer: Self-pay | Admitting: Women's Health

## 2013-06-27 ENCOUNTER — Other Ambulatory Visit: Payer: Self-pay

## 2013-06-27 MED ORDER — NORGESTIMATE-ETH ESTRADIOL 0.25-35 MG-MCG PO TABS: ORAL_TABLET | ORAL | Status: DC

## 2013-07-12 ENCOUNTER — Ambulatory Visit (INDEPENDENT_AMBULATORY_CARE_PROVIDER_SITE_OTHER): Payer: 59 | Admitting: Women's Health

## 2013-07-12 ENCOUNTER — Encounter: Payer: Self-pay | Admitting: Women's Health

## 2013-07-12 VITALS — BP 120/70 | Ht 70.0 in | Wt 204.0 lb

## 2013-07-12 DIAGNOSIS — Z01419 Encounter for gynecological examination (general) (routine) without abnormal findings: Secondary | ICD-10-CM

## 2013-07-12 DIAGNOSIS — Z309 Encounter for contraceptive management, unspecified: Secondary | ICD-10-CM

## 2013-07-12 DIAGNOSIS — IMO0001 Reserved for inherently not codable concepts without codable children: Secondary | ICD-10-CM

## 2013-07-12 LAB — CBC WITH DIFFERENTIAL/PLATELET
BASOS ABS: 0 10*3/uL (ref 0.0–0.1)
Basophils Relative: 0 % (ref 0–1)
Eosinophils Absolute: 0.7 10*3/uL (ref 0.0–0.7)
Eosinophils Relative: 9 % — ABNORMAL HIGH (ref 0–5)
HEMATOCRIT: 40.9 % (ref 36.0–46.0)
HEMOGLOBIN: 13.8 g/dL (ref 12.0–15.0)
LYMPHS ABS: 2.3 10*3/uL (ref 0.7–4.0)
LYMPHS PCT: 30 % (ref 12–46)
MCH: 29.4 pg (ref 26.0–34.0)
MCHC: 33.7 g/dL (ref 30.0–36.0)
MCV: 87.2 fL (ref 78.0–100.0)
MONO ABS: 0.7 10*3/uL (ref 0.1–1.0)
Monocytes Relative: 9 % (ref 3–12)
NEUTROS ABS: 4 10*3/uL (ref 1.7–7.7)
Neutrophils Relative %: 52 % (ref 43–77)
Platelets: 283 10*3/uL (ref 150–400)
RBC: 4.69 MIL/uL (ref 3.87–5.11)
RDW: 13.6 % (ref 11.5–15.5)
WBC: 7.7 10*3/uL (ref 4.0–10.5)

## 2013-07-12 LAB — WET PREP FOR TRICH, YEAST, CLUE
Clue Cells Wet Prep HPF POC: NONE SEEN
Trich, Wet Prep: NONE SEEN
Yeast Wet Prep HPF POC: NONE SEEN

## 2013-07-12 MED ORDER — NORGESTIMATE-ETH ESTRADIOL 0.25-35 MG-MCG PO TABS: ORAL_TABLET | ORAL | Status: DC

## 2013-07-12 NOTE — Progress Notes (Signed)
Melissa Torres 01/13/1986 [id number]   History:    Presents for annual exam. Regular 4-6 day monthly cycles/Sprintec. History of oligomenorrhea with menorrhagia. Complains of sharp intermittent vaginal pains x 6 months that started during menses and are now occur at any time during her cycle. Denies any discharge, spotting, urinary symptoms. Same partner. Normal pap history. Gardasil series complete. Desires pregnancy after December after finishing school. History of anemia and Factor VIII deficiency in 2013, hemoglobin/hematocrit and Factor VIII assay in 2014 were within normal limits. 22lb weight gain in past year.   Past medical history, past surgical history, family history and social history were all reviewed and documented in the EPIC chart. 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 Has younger twin sisters. Mother history of diabetes and hypertension  ROS:  A  12 point ROS was performed and pertinent positives and negatives are included.  Exam:  Filed Vitals:   07/12/13 1553  BP: 120/70    General appearance:  Normal Thyroid:  Symmetrical, normal in size, without palpable masses or nodularity. Respiratory  Auscultation:  Clear without wheezing or rhonchi Cardiovascular  Auscultation:  Regular rate, without rubs, murmurs or gallops  Edema/varicosities:  Not grossly evident Abdominal  Soft,nontender, without masses, guarding or rebound.  Liver/spleen:  No organomegaly noted  Hernia:  None appreciated  Skin  Inspection:  Grossly normal   Breasts: Examined lying and sitting.     Right: Without masses, retractions, discharge or axillary adenopathy.     Left: Without masses, retractions, discharge or axillary adenopathy. Gentitourinary   Inguinal/mons:  Normal without inguinal adenopathy  External genitalia:  Normal  BUS/Urethra/Skene's glands:  Normal  Vagina:  Normal. Wet prep obtained.  Cervix:  Normal  Uterus:  anteverted, normal in size, shape and contour.  Midline and  mobile  Adnexa/parametria:     Rt: Without masses or tenderness.   Lt: Without masses or tenderness.  Anus and perineum: Normal  Assessment/Plan:  28 y.o. MWF G0  for annual exam.  Intermittent vaginal pain  Contraception management History of anemia and Factor VIII deficiency  Plan: Pap 2014 normal, new screening guidelines reviewed. SBE's, healthy iron-rich diet and exercise, reduce calories for weight loss, MVI or PNV encouraged. Wet prep negative, reviewed normality at exam. CBC, rubella titer, UA. Sprintec prescription, proper use, slight risk for blood clots and strokes reviewed. Desires conception in December, aware we no longer deliver, return with missed cycle for viability ultrasound. Safe pregnancy behaviors reviewed.   Note: This dictation was prepared with Dragon/digital dictation.  Any transcriptional errors that result are unintentional. Harrington ChallengerYOUNG,NANCY J Henderson Surgery CenterWHNP, 4:30 PM 07/12/2013

## 2013-07-12 NOTE — Patient Instructions (Signed)

## 2013-07-13 LAB — URINALYSIS W MICROSCOPIC + REFLEX CULTURE
Bacteria, UA: NONE SEEN
Bilirubin Urine: NEGATIVE
CASTS: NONE SEEN
CRYSTALS: NONE SEEN
GLUCOSE, UA: NEGATIVE mg/dL
Hgb urine dipstick: NEGATIVE
Ketones, ur: NEGATIVE mg/dL
LEUKOCYTES UA: NEGATIVE
NITRITE: NEGATIVE
Protein, ur: NEGATIVE mg/dL
SPECIFIC GRAVITY, URINE: 1.027 (ref 1.005–1.030)
SQUAMOUS EPITHELIAL / LPF: NONE SEEN
Urobilinogen, UA: 0.2 mg/dL (ref 0.0–1.0)
pH: 7 (ref 5.0–8.0)

## 2013-07-13 LAB — RUBELLA SCREEN: Rubella: 0.81 Index (ref <0.90)

## 2013-07-24 ENCOUNTER — Other Ambulatory Visit: Payer: Self-pay | Admitting: Women's Health

## 2014-04-09 ENCOUNTER — Encounter (HOSPITAL_COMMUNITY): Payer: Self-pay | Admitting: Emergency Medicine

## 2014-04-09 ENCOUNTER — Emergency Department (HOSPITAL_COMMUNITY)
Admission: EM | Admit: 2014-04-09 | Discharge: 2014-04-09 | Disposition: A | Discharge status: Home / Self Care | Payer: 59 | Source: Home / Self Care | Attending: Family Medicine | Admitting: Family Medicine

## 2014-04-09 DIAGNOSIS — R6889 Other general symptoms and signs: Secondary | ICD-10-CM | POA: Diagnosis not present

## 2014-04-09 DIAGNOSIS — J9801 Acute bronchospasm: Secondary | ICD-10-CM | POA: Diagnosis not present

## 2014-04-09 DIAGNOSIS — B349 Viral infection, unspecified: Secondary | ICD-10-CM

## 2014-04-09 MED ORDER — IPRATROPIUM-ALBUTEROL 0.5-2.5 (3) MG/3ML IN SOLN
3.0000 mL | Freq: Once | RESPIRATORY_TRACT | Status: AC
  Administered 2014-04-09: 3 mL via RESPIRATORY_TRACT

## 2014-04-09 MED ORDER — BECLOMETHASONE DIPROPIONATE 80 MCG/ACT IN AERS: 1.0000 | INHALATION_SPRAY | Freq: Two times a day (BID) | RESPIRATORY_TRACT | Status: DC

## 2014-04-09 MED ORDER — IPRATROPIUM-ALBUTEROL 0.5-2.5 (3) MG/3ML IN SOLN
RESPIRATORY_TRACT | Status: AC
  Filled 2014-04-09: qty 3

## 2014-04-09 MED ORDER — ALBUTEROL SULFATE (2.5 MG/3ML) 0.083% IN NEBU
INHALATION_SOLUTION | RESPIRATORY_TRACT | Status: AC
  Filled 2014-04-09: qty 3

## 2014-04-09 MED ORDER — ALBUTEROL SULFATE (2.5 MG/3ML) 0.083% IN NEBU
2.5000 mg | INHALATION_SOLUTION | Freq: Once | RESPIRATORY_TRACT | Status: AC
  Administered 2014-04-09: 2.5 mg via RESPIRATORY_TRACT

## 2014-04-09 MED ORDER — ALBUTEROL SULFATE HFA 108 (90 BASE) MCG/ACT IN AERS: 2.0000 | INHALATION_SPRAY | RESPIRATORY_TRACT | Status: DC | PRN

## 2014-04-09 NOTE — Discharge Instructions (Signed)
Bronchospasm A bronchospasm is when the tubes that carry air in and out of your lungs (airways) spasm or tighten. During a bronchospasm it is hard to breathe. This is because the airways get smaller. A bronchospasm can be triggered by:  Allergies. These may be to animals, pollen, food, or mold.  Infection. This is a common cause of bronchospasm.  Exercise.  Irritants. These include pollution, cigarette smoke, strong odors, aerosol sprays, and paint fumes.  Weather changes.  Stress.  Being emotional. HOME CARE   Always have a plan for getting help. Know when to call your doctor and local emergency services (911 in the [place] Know where you can get emergency care.  Only take medicines as told by your doctor.  If you were prescribed an inhaler or nebulizer machine, ask your doctor how to use it correctly. Always use a spacer with your inhaler if you were given one.  Stay calm during an attack. Try to relax and breathe more slowly.  Control your home environment:  Change your heating and air conditioning filter at least once a month.  Limit your use of fireplaces and wood stoves.  Do not  smoke. Do not  allow smoking in your home.  Avoid perfumes and fragrances.  Get rid of pests (such as roaches and mice) and their droppings.  Throw away plants if you see mold on them.  Keep your house clean and dust free.  Replace carpet with wood, tile, or vinyl flooring. Carpet can trap dander and dust.  Use allergy-proof pillows, mattress covers, and box spring covers.  Wash bed sheets and blankets every week in hot water. Dry them in a dryer.  Use blankets that are made of polyester or cotton.  Wash hands frequently. GET HELP IF:  You have muscle aches.  You have chest pain.  The thick spit you spit or cough up (sputum) changes from clear or white to yellow, green, gray, or bloody.  The thick spit you spit or cough up gets thicker.  There are problems that may be related  to the medicine you are given such as:  A rash.  Itching.  Swelling.  Trouble breathing. GET HELP RIGHT AWAY IF:  You feel you cannot breathe or catch your breath.  You cannot stop coughing.  Your treatment is not helping you breathe better.  You have very bad chest pain. MAKE SURE YOU:   Understand these instructions.  Will watch your condition.  Will get help right away if you are not doing well or get worse. Document Released: 11/09/2008 Document Revised: 01/17/2013 Document Reviewed: 07/05/2012 [facility] Patient Information 2015 [place], [place]. This information is not intended to replace advice given to you by your health care provider. Make sure you discuss any questions you have with your health care provider.  How to Use an Inhaler Using your inhaler correctly is very important. Good technique will make sure that the medicine reaches your lungs.  HOW TO USE AN INHALER:  Take the cap off the inhaler.  If this is the first time using your inhaler, you need to prime it. Shake the inhaler for 5 seconds. Release four puffs into the air, away from your face. Ask your doctor for help if you have questions.  Shake the inhaler for 5 seconds.  Turn the inhaler so the bottle is above the mouthpiece.  Put your pointer finger on top of the bottle. Your thumb holds the bottom of the inhaler.  Open your mouth.  Either hold  the inhaler away from your mouth (the width of 2 fingers) or place your lips tightly around the mouthpiece. Ask your doctor which way to use your inhaler.  Breathe out as much air as possible.  Breathe in and push down on the bottle 1 time to release the medicine. You will feel the medicine go in your mouth and throat.  Continue to take a deep breath in very slowly. Try to fill your lungs.  After you have breathed in completely, hold your breath for 10 seconds. This will help the medicine to settle in your lungs. If you cannot hold your breath for 10  seconds, hold it for as long as you can before you breathe out.  Breathe out slowly, through pursed lips. Whistling is an example of pursed lips.  If your doctor has told you to take more than 1 puff, wait at least 15-30 seconds between puffs. This will help you get the best results from your medicine. Do not use the inhaler more than your doctor tells you to.  Put the cap back on the inhaler.  Follow the directions from your doctor or from the inhaler package about cleaning the inhaler. If you use more than one inhaler, ask your doctor which inhalers to use and what order to use them in. Ask your doctor to help you figure out when you will need to refill your inhaler.  If you use a steroid inhaler, always rinse your mouth with water after your last puff, gargle and spit out the water. Do not swallow the water. GET HELP IF:  The inhaler medicine only partially helps to stop wheezing or shortness of breath.  You are having trouble using your inhaler.  You have some increase in thick spit (phlegm). GET HELP RIGHT AWAY IF:  The inhaler medicine does not help your wheezing or shortness of breath or you have tightness in your chest.  You have dizziness, headaches, or fast heart rate.  You have chills, fever, or night sweats.  You have a large increase of thick spit, or your thick spit is bloody. MAKE SURE YOU:   Understand these instructions.  Will watch your condition.  Will get help right away if you are not doing well or get worse. Document Released: 10/22/2007 Document Revised: 11/02/2012 Document Reviewed: 08/11/2012 [facility] Patient Information 2015 [place], [place]. This information is not intended to replace advice given to you by your health care provider. Make sure you discuss any questions you have with your health care provider.  Influenza Influenza (flu) is an infection in the mouth, nose, and throat (respiratory tract) caused by a virus. The flu can make you feel very  ill. Influenza spreads easily from person to person (contagious).  HOME CARE   Only take medicines as told by your doctor.  Use a cool mist humidifier to make breathing easier.  Get plenty of rest until your fever goes away. This usually takes 3 to 4 days.  Drink enough fluids to keep your pee (urine) clear or pale yellow.  Cover your mouth and nose when you cough or sneeze.  Wash your hands well to avoid spreading the flu.  Stay home from work or school until your fever has been gone for at least 1 full day.  Get a flu shot every year. GET HELP RIGHT AWAY IF:   You have trouble breathing or feel short of breath.  Your skin or nails turn blue.  You have severe neck pain or stiffness.  You  have a severe headache, facial pain, or earache.  Your fever gets worse or keeps coming back.  You feel sick to your stomach (nauseous), throw up (vomit), or have watery poop (diarrhea).  You have chest pain.  You have a deep cough that gets worse, or you cough up more thick spit (mucus). MAKE SURE YOU:   Understand these instructions.  Will watch your condition.  Will get help right away if you are not doing well or get worse. Document Released: 10/22/2007 Document Revised: 05/29/2013 Document Reviewed: 04/13/2011 [facility] Patient Information 2015 [place], [place]. This information is not intended to replace advice given to you by your health care provider. Make sure you discuss any questions you have with your health care provider.  Viral Infections A viral infection can be caused by different types of viruses.Most viral infections are not serious and resolve on their own. However, some infections may cause severe symptoms and may lead to further complications. SYMPTOMS Viruses can frequently cause:  Minor sore throat.  Aches and pains.  Headaches.  Runny nose.  Different types of rashes.  Watery eyes.  Tiredness.  Cough.  Loss of appetite.  Gastrointestinal  infections, resulting in nausea, vomiting, and diarrhea. These symptoms do not respond to antibiotics because the infection is not caused by bacteria. However, you might catch a bacterial infection following the viral infection. This is sometimes called a "superinfection." Symptoms of such a bacterial infection may include:  Worsening sore throat with pus and difficulty swallowing.  Swollen neck glands.  Chills and a high or persistent fever.  Severe headache.  Tenderness over the sinuses.  Persistent overall ill feeling (malaise), muscle aches, and tiredness (fatigue).  Persistent cough.  Yellow, green, or brown mucus production with coughing. HOME CARE INSTRUCTIONS   Only take over-the-counter or prescription medicines for pain, discomfort, diarrhea, or fever as directed by your caregiver.  Drink enough water and fluids to keep your urine clear or pale yellow. Sports drinks can provide valuable electrolytes, sugars, and hydration.  Get plenty of rest and maintain proper nutrition. Soups and broths with crackers or rice are fine. SEEK IMMEDIATE MEDICAL CARE IF:   You have severe headaches, shortness of breath, chest pain, neck pain, or an unusual rash.  You have uncontrolled vomiting, diarrhea, or you are unable to keep down fluids.  You or your child has an oral temperature above 102 F (38.9 C), not controlled by medicine.  Your baby is older than 3 months with a rectal temperature of 102 F (38.9 C) or higher.  Your baby is 363 months old or younger with a rectal temperature of 100.4 F (38 C) or higher. MAKE SURE YOU:   Understand these instructions.  Will watch your condition.  Will get help right away if you are not doing well or get worse. Document Released: 10/22/2004 Document Revised: 04/06/2011 Document Reviewed: 05/19/2010 [facility] Patient Information 2015 [place]. This information is not intended to replace advice given to you by your health care  provider. Make sure you discuss any questions you have with your health care provider.

## 2014-04-09 NOTE — ED Provider Notes (Signed)
CSN: [id number]     Arrival date & time 04/09/14  0759 History   First MD Initiated Contact with Patient 04/09/14 [phone]     Chief Complaint  Patient presents with  . Cough  . Fever   (Consider location/radiation/quality/duration/timing/severity/associated sxs/prior Treatment) HPI Comments: 29 year old female complaining of a 3 day history of general weakness, fever that has been up to 101-102, recent wheeze, cough and runny nose. She felt better yesterday and was afebrile. Yesterday evening she developed a fever of 102. It is currently 99.1. Her primary concern is the source the fever as well as her cough. She did receive a flu shot this year   Past Medical History  Diagnosis Date  . Hypoglycemia   . Internal derangement of right knee    Past Surgical History  Procedure Laterality Date  . Tonsillectomy and adenoidectomy  AGE 66  . Knee arthroscopy Right 11/07/2012    Procedure: RIGHT KNEE ARTHROSCOPY PLICA RESECTION DEBRIDEMENT AND CHONDROPLASTY ;  Surgeon: Eugenia Mcalpineobert Collins, MD;  [facility] ;  Service: Orthopedics;  Laterality: Right;   Family History  Problem Relation Age of Onset  . Hypertension Mother   . Diabetes Mother   . Hypertension Maternal Grandmother   . Heart failure Maternal Grandmother   . Diabetes Maternal Grandfather    History  Substance Use Topics  . Smoking status: Never Smoker   . Smokeless tobacco: Never Used  . Alcohol Use: Yes     Comment: occassionally   OB History    Gravida Para Term Preterm AB TAB SAB Ectopic Multiple Living   0              Review of Systems  Constitutional: Positive for fever, activity change and fatigue. Negative for appetite change.  HENT: Positive for rhinorrhea. Negative for ear pain, sinus pressure and sneezing.        Minor discomfort in the posterior pharynx.  Respiratory: Positive for cough, shortness of breath and wheezing.        DOE  Gastrointestinal: Negative.   Genitourinary: Negative.    Musculoskeletal: Negative.   Skin: Negative.   Neurological: Negative.     Allergies  Ivp dye and Latex  Home Medications   Prior to Admission medications   Medication Sig Start Date End Date Taking? Authorizing Provider  SPRINTEC 28 0.25-35 MG-MCG tablet TAKE 1 TABLET BY MOUTH DAILY. 07/24/13  Yes Harrington ChallengerNancy J Young, NP  albuterol (PROVENTIL HFA;VENTOLIN HFA) 108 (90 BASE) MCG/ACT inhaler Inhale 2 puffs into the lungs every 4 (four) hours as needed for wheezing or shortness of breath. 04/09/14   Hayden Rasmussenavid Kenneshia Rehm, NP  beclomethasone (QVAR) 80 MCG/ACT inhaler Inhale 1 puff into the lungs 2 (two) times daily. 04/09/14   Hayden Rasmussenavid Renn Stille, NP  Chlorpheniramine-PSE-Ibuprofen (ADVIL ALLERGY SINUS) 2-30-200 MG TABS 1-2 tabs PO Q4-6 hrs PRN 05/08/13   Graylon GoodZachary H Baker, PA-C  fluticasone (FLONASE) 50 MCG/ACT nasal spray Place 2 sprays into both nostrils 2 (two) times daily. Decrease to 2 sprays/nostril daily after 5 days 05/08/13   Graylon GoodZachary H Baker, PA-C  ibuprofen (ADVIL,MOTRIN) 600 MG tablet Take 1 tablet (600 mg total) by mouth every 8 (eight) hours as needed for pain or fever. 03/22/12   Adlih Moreno-Coll, MD   BP 132/88 mmHg  Pulse 92  Temp(Src) 99.1 F (37.3 C) (Oral)  Resp 16  SpO2 98%  LMP  Physical Exam  Constitutional: She is oriented to person, place, and time. She appears well-developed and well-nourished. No distress.  HENT:  Bilateral TMs are normal Oropharynx with minor streaky erythema to the lateral aspect of the posterior pharynx. No exudates or swelling  Eyes: Conjunctivae and EOM are normal.  Neck: Normal range of motion. Neck supple.  Cardiovascular: Normal rate, regular rhythm and normal heart sounds.   Pulmonary/Chest: Effort normal.  Prolonged expiratory phase. Bilateral coarseness and minimal wheeze with forced expiration which is more prominent with cough. No rhonchi or rales.  Musculoskeletal: Normal range of motion.  Lymphadenopathy:    She has no cervical adenopathy.   Neurological: She is alert and oriented to person, place, and time.  Skin: Skin is warm and dry.  Nursing note and vitals reviewed.   ED Course  Procedures (including critical care time) Labs Review Labs Reviewed - No data to display  Imaging Review No results found.   MDM   1. Viral illness   2. Flu-like symptoms   3. Bronchospasm     Albuterol HFA as directed Qvar 80 mg twice a day Tylenol when necessary For persistent fevers, worsening symptoms, shortness of breath return for evaluation. Received a DuoNeb of 5 mg/2.5 mg. Post neb her lungs are clearer, air movement has improved and her cough decreased. She continues to have some coarseness and wheezing. She will use the above inhalers for treatment. She declined oral steroids due to history of side effects.    Hayden Rasmussen, NP 04/09/14 [phone]

## 2014-04-09 NOTE — ED Notes (Signed)
Pt states that she has had a cough with fever since 04/06/2014

## 2014-06-12 ENCOUNTER — Other Ambulatory Visit: Payer: Self-pay

## 2014-06-12 MED ORDER — NORGESTIMATE-ETH ESTRADIOL 0.25-35 MG-MCG PO TABS: 1.0000 | ORAL_TABLET | Freq: Every day | ORAL | Status: DC

## 2014-06-19 ENCOUNTER — Other Ambulatory Visit: Payer: Self-pay | Admitting: *Deleted

## 2014-06-19 MED ORDER — NORGESTIMATE-ETH ESTRADIOL 0.25-35 MG-MCG PO TABS: 1.0000 | ORAL_TABLET | Freq: Every day | ORAL | Status: DC

## 2014-08-07 ENCOUNTER — Other Ambulatory Visit: Payer: Self-pay | Admitting: Gynecology

## 2014-08-07 MED ORDER — NORGESTIMATE-ETH ESTRADIOL 0.25-35 MG-MCG PO TABS: 1.0000 | ORAL_TABLET | Freq: Every day | ORAL | Status: DC

## 2014-08-08 ENCOUNTER — Other Ambulatory Visit: Payer: Self-pay | Admitting: Gynecology

## 2014-11-03 ENCOUNTER — Other Ambulatory Visit: Payer: Self-pay | Admitting: Gynecology

## 2014-11-30 ENCOUNTER — Other Ambulatory Visit: Payer: Self-pay

## 2014-11-30 MED ORDER — NORGESTIMATE-ETH ESTRADIOL 0.25-35 MG-MCG PO TABS: 1.0000 | ORAL_TABLET | Freq: Every day | ORAL | Status: DC

## 2014-12-18 ENCOUNTER — Other Ambulatory Visit (HOSPITAL_COMMUNITY)
Admission: RE | Admit: 2014-12-18 | Discharge: 2014-12-18 | Disposition: A | Discharge status: Home / Self Care | Payer: 59 | Source: Ambulatory Visit | Attending: Women's Health | Admitting: Women's Health

## 2014-12-18 ENCOUNTER — Encounter: Payer: Self-pay | Admitting: Women's Health

## 2014-12-18 ENCOUNTER — Ambulatory Visit (INDEPENDENT_AMBULATORY_CARE_PROVIDER_SITE_OTHER): Payer: 59 | Admitting: Women's Health

## 2014-12-18 VITALS — BP 128/80 | Ht 68.0 in | Wt 222.0 lb

## 2014-12-18 DIAGNOSIS — Z01411 Encounter for gynecological examination (general) (routine) with abnormal findings: Secondary | ICD-10-CM | POA: Insufficient documentation

## 2014-12-18 DIAGNOSIS — Z1151 Encounter for screening for human papillomavirus (HPV): Secondary | ICD-10-CM | POA: Diagnosis not present

## 2014-12-18 DIAGNOSIS — Z01419 Encounter for gynecological examination (general) (routine) without abnormal findings: Secondary | ICD-10-CM

## 2014-12-18 DIAGNOSIS — Z13220 Encounter for screening for lipoid disorders: Secondary | ICD-10-CM | POA: Diagnosis not present

## 2014-12-18 DIAGNOSIS — Z3040 Encounter for surveillance of contraceptives, unspecified: Secondary | ICD-10-CM

## 2014-12-18 LAB — CBC WITH DIFFERENTIAL/PLATELET
BASOS ABS: 0 10*3/uL (ref 0.0–0.1)
Basophils Relative: 0 % (ref 0–1)
EOS ABS: 0.6 10*3/uL (ref 0.0–0.7)
EOS PCT: 8 % — AB (ref 0–5)
HCT: 40.3 % (ref 36.0–46.0)
Hemoglobin: 13.5 g/dL (ref 12.0–15.0)
LYMPHS ABS: 2.1 10*3/uL (ref 0.7–4.0)
LYMPHS PCT: 29 % (ref 12–46)
MCH: 29.2 pg (ref 26.0–34.0)
MCHC: 33.5 g/dL (ref 30.0–36.0)
MCV: 87.2 fL (ref 78.0–100.0)
MONOS PCT: 8 % (ref 3–12)
MPV: 9.7 fL (ref 8.6–12.4)
Monocytes Absolute: 0.6 10*3/uL (ref 0.1–1.0)
Neutro Abs: 3.9 10*3/uL (ref 1.7–7.7)
Neutrophils Relative %: 55 % (ref 43–77)
PLATELETS: 284 10*3/uL (ref 150–400)
RBC: 4.62 MIL/uL (ref 3.87–5.11)
RDW: 13.5 % (ref 11.5–15.5)
WBC: 7.1 10*3/uL (ref 4.0–10.5)

## 2014-12-18 LAB — GLUCOSE, RANDOM: GLUCOSE: 74 mg/dL (ref 65–99)

## 2014-12-18 LAB — LIPID PANEL
Cholesterol: 198 mg/dL (ref 125–200)
HDL: 56 mg/dL (ref >=46)
LDL CALC: 118 mg/dL (ref <130)
TRIGLYCERIDES: 119 mg/dL (ref <150)
Total CHOL/HDL Ratio: 3.5 Ratio (ref <=5.0)
VLDL: 24 mg/dL (ref <30)

## 2014-12-18 MED ORDER — NORGESTIMATE-ETH ESTRADIOL 0.25-35 MG-MCG PO TABS: 1.0000 | ORAL_TABLET | Freq: Every day | ORAL | Status: DC

## 2014-12-18 NOTE — Progress Notes (Signed)
Melissa Torres 08/23/1985 [id number]    History:    Presents for annual exam.  Light monthly cycle on Ortho Tri-Cyclen without complaint. Normal Pap history. Gardasil series completed. History of irregular cycles, regular on OCs. History of anemia with factor VIII deficiency, normal in 2014  Past medical history, past surgical history, family history and social history were all reviewed and documented in the EPIC chart. 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 Moving into a new home this week. Mother diabetes and hypertension. Has twin sisters.  ROS:  A ROS was performed and pertinent positives and negatives are included.  Exam:  Filed Vitals:   12/18/14 0812  BP: 128/80    General appearance:  Normal Thyroid:  Symmetrical, normal in size, without palpable masses or nodularity. Respiratory  Auscultation:  Clear without wheezing or rhonchi Cardiovascular  Auscultation:  Regular rate, without rubs, murmurs or gallops  Edema/varicosities:  Not grossly evident Abdominal  Soft,nontender, without masses, guarding or rebound.  Liver/spleen:  No organomegaly noted  Hernia:  None appreciated  Skin  Inspection:  Grossly normal   Breasts: Examined lying and sitting.     Right: Without masses, retractions, discharge or axillary adenopathy.     Left: Without masses, retractions, discharge or axillary adenopathy. Gentitourinary   Inguinal/mons:  Normal without inguinal adenopathy  External genitalia:  Normal  BUS/Urethra/Skene's glands:  Normal  Vagina:  Normal  Cervix:  Normal  Uterus:  normal in size, shape and contour.  Midline and mobile  Adnexa/parametria:     Rt: Without masses or tenderness.   Lt: Without masses or tenderness.  Anus and perineum: Normal  Digital rectal exam: Normal sphincter tone without palpated masses or tenderness  Assessment/Plan:  29 y.o.  MWF G0 for annual exam with no complaints.   light monthly cycle on Sprintec   history of anemia factor VIII deficiency  with repeat normal 2014.  Rubella nonimmune   Plan: instructed to go to employee health to get rubella vaccine , rubella nonimmune. Planning pregnancy for next year.  Sprintec prescription, proper use, slight risk for blood clots and strokes reviewed. SBE's, regular exercise, calcium rich diet, decrease calories for weight loss encouraged. Safe pregnancy behaviors reviewed, MVI daily encouraged. CBC, glucose, lipid panel, UA, pap.   Harrington ChallengerYOUNG,Lavera Vandermeer J Northeast Medical GroupWHNP, 8:55 AM 12/18/2014

## 2014-12-18 NOTE — Patient Instructions (Signed)
Health Maintenance, Female Adopting a healthy lifestyle and getting preventive care can go a long way to promote health and wellness. Talk with your health care provider about what schedule of regular examinations is right for you. This is a good chance for you to check in with your provider about disease prevention and staying healthy. In between checkups, there are plenty of things you can do on your own. Experts have done a lot of research about which lifestyle changes and preventive measures are most likely to keep you healthy. Ask your health care provider for more information. WEIGHT AND DIET  Eat a healthy diet  Be sure to include plenty of vegetables, fruits, low-fat dairy products, and lean protein.  Do not eat a lot of foods high in solid fats, added sugars, or salt.  Get regular exercise. This is one of the most important things you can do for your health.  Most adults should exercise for at least 150 minutes each week. The exercise should increase your heart rate and make you sweat (moderate-intensity exercise).  Most adults should also do strengthening exercises at least twice a week. This is in addition to the moderate-intensity exercise.  Maintain a healthy weight  Body mass index (BMI) is a measurement that can be used to identify possible weight problems. It estimates body fat based on height and weight. Your health care provider can help determine your BMI and help you achieve or maintain a healthy weight.  For females 20 years of age and older:   A BMI below 18.5 is considered underweight.  A BMI of 18.5 to 24.9 is normal.  A BMI of 25 to 29.9 is considered overweight.  A BMI of 30 and above is considered obese.  Watch levels of cholesterol and blood lipids  You should start having your blood tested for lipids and cholesterol at 29 years of age, then have this test every 5 years.  You may need to have your cholesterol levels checked more often if:  Your lipid  or cholesterol levels are high.  You are older than 29 years of age.  You are at high risk for heart disease.  CANCER SCREENING   Lung Cancer  Lung cancer screening is recommended for adults 55-80 years old who are at high risk for lung cancer because of a history of smoking.  A yearly low-dose CT scan of the lungs is recommended for people who:  Currently smoke.  Have quit within the past 15 years.  Have at least a 30-pack-year history of smoking. A pack year is smoking an average of one pack of cigarettes a day for 1 year.  Yearly screening should continue until it has been 15 years since you quit.  Yearly screening should stop if you develop a health problem that would prevent you from having lung cancer treatment.  Breast Cancer  Practice breast self-awareness. This means understanding how your breasts normally appear and feel.  It also means doing regular breast self-exams. Let your health care provider know about any changes, no matter how small.  If you are in your 20s or 30s, you should have a clinical breast exam (CBE) by a health care provider every 1-3 years as part of a regular health exam.  If you are 40 or older, have a CBE every year. Also consider having a breast X-ray (mammogram) every year.  If you have a family history of breast cancer, talk to your health care provider about genetic screening.  If you   are at high risk for breast cancer, talk to your health care provider about having an MRI and a mammogram every year.  Breast cancer gene (BRCA) assessment is recommended for women who have family members with BRCA-related cancers. BRCA-related cancers include:  Breast.  Ovarian.  Tubal.  Peritoneal cancers.  Results of the assessment will determine the need for genetic counseling and BRCA1 and BRCA2 testing. Cervical Cancer Your health care provider may recommend that you be screened regularly for cancer of the pelvic organs (ovaries, uterus, and  vagina). This screening involves a pelvic examination, including checking for microscopic changes to the surface of your cervix (Pap test). You may be encouraged to have this screening done every 3 years, beginning at age 21.  For women ages 30-65, health care providers may recommend pelvic exams and Pap testing every 3 years, or they may recommend the Pap and pelvic exam, combined with testing for human papilloma virus (HPV), every 5 years. Some types of HPV increase your risk of cervical cancer. Testing for HPV may also be done on women of any age with unclear Pap test results.  Other health care providers may not recommend any screening for nonpregnant women who are considered low risk for pelvic cancer and who do not have symptoms. Ask your health care provider if a screening pelvic exam is right for you.  If you have had past treatment for cervical cancer or a condition that could lead to cancer, you need Pap tests and screening for cancer for at least 20 years after your treatment. If Pap tests have been discontinued, your risk factors (such as having a new sexual partner) need to be reassessed to determine if screening should resume. Some women have medical problems that increase the chance of getting cervical cancer. In these cases, your health care provider may recommend more frequent screening and Pap tests. Colorectal Cancer  This type of cancer can be detected and often prevented.  Routine colorectal cancer screening usually begins at 29 years of age and continues through 29 years of age.  Your health care provider may recommend screening at an earlier age if you have risk factors for colon cancer.  Your health care provider may also recommend using home test kits to check for hidden blood in the stool.  A small camera at the end of a tube can be used to examine your colon directly (sigmoidoscopy or colonoscopy). This is done to check for the earliest forms of colorectal  cancer.  Routine screening usually begins at age 50.  Direct examination of the colon should be repeated every 5-10 years through 29 years of age. However, you may need to be screened more often if early forms of precancerous polyps or small growths are found. Skin Cancer  Check your skin from head to toe regularly.  Tell your health care provider about any new moles or changes in moles, especially if there is a change in a mole's shape or color.  Also tell your health care provider if you have a mole that is larger than the size of a pencil eraser.  Always use sunscreen. Apply sunscreen liberally and repeatedly throughout the day.  Protect yourself by wearing long sleeves, pants, a wide-brimmed hat, and sunglasses whenever you are outside. HEART DISEASE, DIABETES, AND HIGH BLOOD PRESSURE   High blood pressure causes heart disease and increases the risk of stroke. High blood pressure is more likely to develop in:  People who have blood pressure in the high end   of the normal range (130-139/85-89 mm Hg).  People who are overweight or obese.  People who are African American.  If you are 38-23 years of age, have your blood pressure checked every 3-5 years. If you are 61 years of age or older, have your blood pressure checked every year. You should have your blood pressure measured twice--once when you are at a hospital or clinic, and once when you are not at a hospital or clinic. Record the average of the two measurements. To check your blood pressure when you are not at a hospital or clinic, you can use:  An automated blood pressure machine at a pharmacy.  A home blood pressure monitor.  If you are between 45 years and 39 years old, ask your health care provider if you should take aspirin to prevent strokes.  Have regular diabetes screenings. This involves taking a blood sample to check your fasting blood sugar level.  If you are at a normal weight and have a low risk for diabetes,  have this test once every three years after 29 years of age.  If you are overweight and have a high risk for diabetes, consider being tested at a younger age or more often. PREVENTING INFECTION  Hepatitis B  If you have a higher risk for hepatitis B, you should be screened for this virus. You are considered at high risk for hepatitis B if:  You were born in a country where hepatitis B is common. Ask your health care provider which countries are considered high risk.  Your parents were born in a high-risk country, and you have not been immunized against hepatitis B (hepatitis B vaccine).  You have HIV or AIDS.  You use needles to inject street drugs.  You live with someone who has hepatitis B.  You have had sex with someone who has hepatitis B.  You get hemodialysis treatment.  You take certain medicines for conditions, including cancer, organ transplantation, and autoimmune conditions. Hepatitis C  Blood testing is recommended for:  Everyone born from [birth year] through 1965.  Anyone with known risk factors for hepatitis C. Sexually transmitted infections (STIs)  You should be screened for sexually transmitted infections (STIs) including gonorrhea and chlamydia if:  You are sexually active and are younger than 29 years of age.  You are older than 29 years of age and your health care provider tells you that you are at risk for this type of infection.  Your sexual activity has changed since you were last screened and you are at an increased risk for chlamydia or gonorrhea. Ask your health care provider if you are at risk.  If you do not have HIV, but are at risk, it may be recommended that you take a prescription medicine daily to prevent HIV infection. This is called pre-exposure prophylaxis (PrEP). You are considered at risk if:  You are sexually active and do not regularly use condoms or know the HIV status of your partner(s).  You take drugs by injection.  You are sexually  active with a partner who has HIV. Talk with your health care provider about whether you are at high risk of being infected with HIV. If you choose to begin PrEP, you should first be tested for HIV. You should then be tested every 3 months for as long as you are taking PrEP.  PREGNANCY   If you are premenopausal and you may become pregnant, ask your health care provider about preconception counseling.  If you may  become pregnant, take 400 to 800 micrograms (mcg) of folic acid every day.  If you want to prevent pregnancy, talk to your health care provider about birth control (contraception). OSTEOPOROSIS AND MENOPAUSE   Osteoporosis is a disease in which the bones lose minerals and strength with aging. This can result in serious bone fractures. Your risk for osteoporosis can be identified using a bone density scan.  If you are 61 years of age or older, or if you are at risk for osteoporosis and fractures, ask your health care provider if you should be screened.  Ask your health care provider whether you should take a calcium or vitamin D supplement to lower your risk for osteoporosis.  Menopause may have certain physical symptoms and risks.  Hormone replacement therapy may reduce some of these symptoms and risks. Talk to your health care provider about whether hormone replacement therapy is right for you.  HOME CARE INSTRUCTIONS   Schedule regular health, dental, and eye exams.  Stay current with your immunizations.   Do not use any tobacco products including cigarettes, chewing tobacco, or electronic cigarettes.  If you are pregnant, do not drink alcohol.  If you are breastfeeding, limit how much and how often you drink alcohol.  Limit alcohol intake to no more than 1 drink per day for nonpregnant women. One drink equals 12 ounces of beer, 5 ounces of wine, or 1 ounces of hard liquor.  Do not use street drugs.  Do not share needles.  Ask your health care provider for help if  you need support or information about quitting drugs.  Tell your health care provider if you often feel depressed.  Tell your health care provider if you have ever been abused or do not feel safe at home.   This information is not intended to replace advice given to you by your health care provider. Make sure you discuss any questions you have with your health care provider.   Document Released: 07/28/2010 Document Revised: 02/02/2014 Document Reviewed: 12/14/2012 Elsevier Interactive Patient Education [employer].

## 2014-12-19 ENCOUNTER — Encounter: Payer: Self-pay | Admitting: Women's Health

## 2014-12-19 LAB — URINALYSIS W MICROSCOPIC + REFLEX CULTURE
Bilirubin Urine: NEGATIVE
CASTS: NONE SEEN [LPF]
CRYSTALS: NONE SEEN [HPF]
Glucose, UA: NEGATIVE
Hgb urine dipstick: NEGATIVE
KETONES UR: NEGATIVE
Leukocytes, UA: NEGATIVE
Nitrite: NEGATIVE
PROTEIN: NEGATIVE
SPECIFIC GRAVITY, URINE: 1.016 (ref 1.001–1.035)
Squamous Epithelial / LPF: NONE SEEN [HPF] (ref <=5)
WBC, UA: NONE SEEN WBC/HPF (ref <=5)
Yeast: NONE SEEN [HPF]
pH: 7 (ref 5.0–8.0)

## 2014-12-19 LAB — CYTOLOGY - PAP

## 2014-12-21 LAB — URINE CULTURE: Colony Count: >=100000

## 2014-12-24 ENCOUNTER — Other Ambulatory Visit: Payer: Self-pay | Admitting: Women's Health

## 2014-12-24 MED ORDER — SULFAMETHOXAZOLE-TRIMETHOPRIM 800-160 MG PO TABS: 1.0000 | ORAL_TABLET | Freq: Two times a day (BID) | ORAL | Status: DC

## 2014-12-25 ENCOUNTER — Encounter: Payer: Self-pay | Admitting: Women's Health

## 2015-03-29 ENCOUNTER — Ambulatory Visit (INDEPENDENT_AMBULATORY_CARE_PROVIDER_SITE_OTHER): Payer: 59 | Admitting: Women's Health

## 2015-03-29 ENCOUNTER — Encounter: Payer: Self-pay | Admitting: Women's Health

## 2015-03-29 VITALS — BP 130/80 | Ht 68.0 in | Wt 222.0 lb

## 2015-03-29 DIAGNOSIS — N898 Other specified noninflammatory disorders of vagina: Secondary | ICD-10-CM

## 2015-03-29 DIAGNOSIS — R350 Frequency of micturition: Secondary | ICD-10-CM

## 2015-03-29 DIAGNOSIS — L298 Other pruritus: Secondary | ICD-10-CM | POA: Diagnosis not present

## 2015-03-29 LAB — URINALYSIS W MICROSCOPIC + REFLEX CULTURE
BACTERIA UA: NONE SEEN [HPF]
BILIRUBIN URINE: NEGATIVE
Casts: NONE SEEN [LPF]
Crystals: NONE SEEN [HPF]
GLUCOSE, UA: NEGATIVE
Ketones, ur: NEGATIVE
LEUKOCYTES UA: NEGATIVE
Nitrite: NEGATIVE
PROTEIN: NEGATIVE
RBC / HPF: NONE SEEN RBC/HPF (ref <=2)
WBC UA: NONE SEEN WBC/HPF (ref <=5)
YEAST: NONE SEEN [HPF]
pH: 6 (ref 5.0–8.0)

## 2015-03-29 LAB — WET PREP FOR TRICH, YEAST, CLUE
Clue Cells Wet Prep HPF POC: NONE SEEN
TRICH WET PREP: NONE SEEN
YEAST WET PREP: NONE SEEN

## 2015-03-29 MED ORDER — FLUCONAZOLE 150 MG PO TABS
150.0000 mg | ORAL_TABLET | Freq: Once | ORAL | Status: DC
Start: 2015-03-29 — End: 2015-07-04

## 2015-03-29 NOTE — Addendum Note (Signed)
Addended by: Kem ParkinsonBARNES, Dinia Joynt on: 03/29/2015 04:05 PM   Modules accepted: Orders

## 2015-03-29 NOTE — Patient Instructions (Signed)

## 2015-03-29 NOTE — Progress Notes (Signed)
Patient ID: Melissa DonningChassity L Torres, female   DOB: 03/16/1985, 30 y.o.   MRN: [id number] Presents with complaint of vaginal irritation and itching with relief with over-the-counter Monistat but symptoms return, has used montly for the last few months. Recent discomfort with intercourse. Denies vaginal odor, urinary symptoms, abdominal pain or fever. Same partner/10 years. Monthly cycle on Tri Sprintec.  Exam: Appears well. External genitalia erythematous, right inner labia 1-2 cm postule with small amount of pressure white material exuded, no odor. Speculum exam scant white discharge wet prep negative. .Bimanual no adnexal fullness or tenderness no CMT. UA: Negative  Vaginal irritation  Plan: Loose clothing, call if continued vaginal irritation, Diflucan 150 by mouth 1 dose for vaginal itching, avoid over-the-counter Monistat. Prenatal vitamin daily encouraged,  contemplating pregnancy over the next year. Rubella nonimmune, aware to get rubella vaccine and avoid pregnancy for 3 months.

## 2015-04-15 MED FILL — CLOTRIMAZOLE-BETAMETHASONE: 1-0.05 | 20 days supply | Qty: 45 | Fill #0

## 2015-07-04 ENCOUNTER — Ambulatory Visit (INDEPENDENT_AMBULATORY_CARE_PROVIDER_SITE_OTHER): Payer: 59 | Admitting: Women's Health

## 2015-07-04 ENCOUNTER — Encounter: Payer: Self-pay | Admitting: Women's Health

## 2015-07-04 VITALS — BP 132/78 | Ht 68.0 in | Wt 222.0 lb

## 2015-07-04 DIAGNOSIS — Z3041 Encounter for surveillance of contraceptive pills: Secondary | ICD-10-CM

## 2015-07-04 MED ORDER — NORGESTIM-ETH ESTRAD TRIPHASIC 0.18/0.215/0.25 MG-35 MCG PO TABS: 1.0000 | ORAL_TABLET | Freq: Every day | ORAL | Status: DC

## 2015-07-04 NOTE — Progress Notes (Signed)
Patient ID: Melissa Torres, female   DOB: 07/22/1985, 30 y.o.   MRN: [id number] Presents with several concerns. States has had  occasional heart palpitations lasting a minute that are relieved with a deep breath , occurring week 2 and 3 of birth control pills for the last several months not weekly for the past several months. Denies any shortness of breath, vertigo, headache, or chest pain. States possibly related to mild anxiety. Planning to try to conceive in the fall. Currently on Sprintec which she has been on for years. Also questions Zika virus planning a tropical cruise in the fall, would like to have Zika antibodies drawn after her trip to be sure she is safe to conceive. Aware to use bug repellent.  Denies any spotting or irregular bleeding on pills. Same partner years. OR Building control surveyornurse supervisor.  Exam: Appears well. Lungs clear throughout. Heart regular rate and rhythm.  Questionable heart palpitations related to OCs  Plan: Options reviewed. Reviewed importance of ER follow-up if shortness of breath or if palpitations do not resolve with deep breath. Will try Ortho Tri-Cyclen to see if that may help. Will start with next pack. Will return to the office in the fall for Zika antibodies blood test. again reminded to have rubella vaccine not conceive for 3 months.

## 2015-07-11 ENCOUNTER — Telehealth: Payer: Self-pay | Admitting: *Deleted

## 2015-07-11 NOTE — Telephone Encounter (Signed)
Pt called has a order to have rubella vaccine done at her job, but was told she had MMR already. Pt was told to call and confirm with you that rubella vaccine is needed. Please advise

## 2015-07-12 NOTE — Telephone Encounter (Signed)
Yes she has had the vaccine but her rubella titer is non-immune so needs to repeat. Her rubella titer is in the lab reports

## 2015-07-12 NOTE — Telephone Encounter (Signed)
Pt informed will relay.

## 2015-07-31 ENCOUNTER — Telehealth: Payer: Self-pay

## 2015-07-31 NOTE — Telephone Encounter (Signed)
Telephone call, reviewed discussed with Dr. Phineas Real and employee health, does not need a booster at this time has had MMR in the past will abstain for 3 months and then try to conceive.

## 2015-07-31 NOTE — Telephone Encounter (Signed)
I called patient to let her know it will be tomorrow before she hears back from us. She said [place] earlier and she told her she would need to check with Dr. Velvet BatheF and would let her know.

## 2015-07-31 NOTE — Telephone Encounter (Signed)
Patient called. She said she [facility]ella [facility]. The nurse who gave it to her told her she would need to return in 30 days for a second vaccine.  She said [place] not mention a second vaccine to her. She said if indeed she needs 2nd vaccine she will need [place] to write an order for it.  She also asked if she needs 2nd vaccine does her 3 mos waiting to try to conceive start at 2nd vaccine?

## 2015-08-26 ENCOUNTER — Telehealth: Payer: Self-pay | Admitting: *Deleted

## 2015-08-26 NOTE — Telephone Encounter (Signed)
Pt left message in [facility] voicemail requesting her last annual date, I called pt unable to leave a message as her voicemail was full.

## 2015-09-24 ENCOUNTER — Other Ambulatory Visit: Payer: Self-pay | Admitting: Gynecology

## 2015-09-24 MED ORDER — SCOPOLAMINE 1 MG/3DAYS TD PT72: 1.0000 | MEDICATED_PATCH | TRANSDERMAL | 0 refills | Status: DC

## 2015-12-29 DIAGNOSIS — J111 Influenza due to unidentified influenza virus with other respiratory manifestations: Secondary | ICD-10-CM | POA: Diagnosis not present

## 2016-01-28 ENCOUNTER — Telehealth: Payer: Self-pay | Admitting: *Deleted

## 2016-01-28 NOTE — Telephone Encounter (Signed)
Have her check first am urine with UPT in another week,  She is a Engineer, civil (consulting)nurse.

## 2016-01-28 NOTE — Telephone Encounter (Addendum)
Pt called stating positive UPT 3 days ago, took another UPT this am and negative. I explained to patient to schedule OV with nancy to confirm. Will have front desk to schedule.

## 2016-01-28 NOTE — Telephone Encounter (Signed)
Pt informed with the below note. 

## 2016-01-28 NOTE — Telephone Encounter (Signed)
Melissa Torres patient had positive UPT 3 days ago, and negative UPT this am, asked if she should have a beta HCG blood test to confirm due to 2 different results. I explained to patient OV for urine UPT at office is protocol. Pt said she is only 8 days late from cycle. She would like to know if she should wait until no cycle in 2 weeks then schedule office visit? Please advise

## 2016-01-29 ENCOUNTER — Ambulatory Visit: Payer: 59 | Admitting: Women's Health

## 2016-02-11 ENCOUNTER — Ambulatory Visit: Payer: 59 | Admitting: Women's Health

## 2016-02-11 DIAGNOSIS — Z0289 Encounter for other administrative examinations: Secondary | ICD-10-CM

## 2016-02-27 LAB — HM PAP SMEAR: HM Pap smear: NORMAL

## 2016-03-18 ENCOUNTER — Encounter: Payer: Self-pay | Admitting: Women's Health

## 2016-03-18 ENCOUNTER — Ambulatory Visit (INDEPENDENT_AMBULATORY_CARE_PROVIDER_SITE_OTHER): Payer: 59 | Admitting: Women's Health

## 2016-03-18 VITALS — BP 128/86 | Ht 68.0 in | Wt 223.0 lb

## 2016-03-18 DIAGNOSIS — N912 Amenorrhea, unspecified: Secondary | ICD-10-CM | POA: Diagnosis not present

## 2016-03-18 DIAGNOSIS — Z01419 Encounter for gynecological examination (general) (routine) without abnormal findings: Secondary | ICD-10-CM | POA: Diagnosis not present

## 2016-03-18 LAB — LIPID PANEL
CHOLESTEROL: 215 mg/dL — AB (ref <200)
HDL: 53 mg/dL (ref >50)
LDL Cholesterol: 142 mg/dL — ABNORMAL HIGH (ref <100)
TRIGLYCERIDES: 98 mg/dL (ref <150)
Total CHOL/HDL Ratio: 4.1 Ratio (ref <5.0)
VLDL: 20 mg/dL (ref <30)

## 2016-03-18 LAB — CBC WITH DIFFERENTIAL/PLATELET
BASOS PCT: 0 %
Basophils Absolute: 0 cells/uL (ref 0–200)
EOS ABS: 568 {cells}/uL — AB (ref 15–500)
Eosinophils Relative: 8 %
HEMATOCRIT: 41.3 % (ref 35.0–45.0)
Hemoglobin: 13.8 g/dL (ref 11.7–15.5)
LYMPHS PCT: 34 %
Lymphs Abs: 2414 cells/uL (ref 850–3900)
MCH: 29.1 pg (ref 27.0–33.0)
MCHC: 33.4 g/dL (ref 32.0–36.0)
MCV: 86.9 fL (ref 80.0–100.0)
MONO ABS: 497 {cells}/uL (ref 200–950)
MPV: 9.8 fL (ref 7.5–12.5)
Monocytes Relative: 7 %
NEUTROS PCT: 51 %
Neutro Abs: 3621 cells/uL (ref 1500–7800)
Platelets: 261 10*3/uL (ref 140–400)
RBC: 4.75 MIL/uL (ref 3.80–5.10)
RDW: 14 % (ref 11.0–15.0)
WBC: 7.1 10*3/uL (ref 3.8–10.8)

## 2016-03-18 LAB — GLUCOSE, RANDOM: Glucose, Bld: 81 mg/dL (ref 65–99)

## 2016-03-18 MED ORDER — MEDROXYPROGESTERONE ACETATE 10 MG PO TABS: 10.0000 mg | ORAL_TABLET | Freq: Every day | ORAL | 0 refills | Status: DC

## 2016-03-18 NOTE — Progress Notes (Signed)
Melissa Torres 07/20/1985 [id number]    History:    Presents for annual exam.  Complains of missed period this month and new onset of lower back pain for 2 weeks, localized to lower abd x 2 days. Home urine pregnancy test negative. LMP 01/28/16.  Denies hx of abnml bleeding, nipple discharge, or discharge. Cyrus conception, stopped OCs 6 months ago has had a monthly cycle since stopping OCs until January. Same partner. Negative STD screen. Takes prenatal supplement- natural oil vitamin. Normal Pap history.  Past medical history, past surgical history, family history and social history were all reviewed and documented in the EPIC chart. 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  ROS:  A ROS was performed and pertinent positives and negatives are included.  Exam:  Vitals:   03/18/16 0947  BP: 128/86  Weight: 223 lb (101.2 kg)  Height: _0  (1.727 m)   Body mass index is 33.91 kg/m.   General appearance:  Normal Thyroid:  Symmetrical, normal in size, without palpable masses or nodularity. Respiratory  Auscultation:  Clear without wheezing or rhonchi Cardiovascular  Auscultation:  Regular rate, without rubs, murmurs or gallops  Edema/varicosities:  Not grossly evident Abdominal  Soft,nontender, without masses, guarding or rebound.  Liver/spleen:  No organomegaly noted  Hernia:  None appreciated  Skin  Inspection:  Grossly normal   Breasts: Examined lying and sitting. Tenderness noted with palpation.    Right: Without masses, retractions, discharge or axillary adenopathy.     Left: Without masses, retractions, discharge or axillary adenopathy. Gentitourinary   Inguinal/mons:  Normal without inguinal adenopathy  External genitalia:  Normal  BUS/Urethra/Skene's glands:  Normal  Vagina:  Normal  Cervix:  Normal  Uterus:  normal in size, shape and contour.  Midline and mobile  Adnexa/parametria:     Rt: Without masses or tenderness.   Lt: Without  masses or tenderness.  Anus and perineum: Normal  Digital rectal exam: Normal sphincter tone without palpated masses or tenderness  Assessment/Plan:  30 y.o.MWF G0  for annual exam.   Missed menses with home negative UPT Obesity  Plan: Advised to continue sexual intercourse every other day, monitor with at home ovulation kit and pregnancy tests. Discussed benefits of healthy eating and exercise. CBC, glucose, hCG, lipid panel. If hCG negative Provera 10 mg by mouth daily for 5 days and call if no cycle after 2 weeks. Talked about options blood work [facility] and estradiol day 3 of cycle, progesterone day 22-25,  Semen analysis, fertility management with if unsuccessful with conceiving in another 6 mos. Instructed to call if she has any questions or concerns. Pap normal 2016, new screening guidelines reviewed. Where we no longer deliver will call with missed cycle for viability ultrasound. Healthy pregnancy behaviors reviewed.    [place], 10:26 AM 03/18/2016

## 2016-03-18 NOTE — Patient Instructions (Signed)

## 2016-03-19 ENCOUNTER — Encounter: Payer: Self-pay | Admitting: Women's Health

## 2016-03-19 LAB — HCG, SERUM, QUALITATIVE: Preg, Serum: NEGATIVE

## 2016-03-26 LAB — HM PAP SMEAR: HM Pap smear: NORMAL

## 2016-05-26 ENCOUNTER — Encounter: Payer: Self-pay | Admitting: Women's Health

## 2016-05-26 ENCOUNTER — Other Ambulatory Visit: Payer: Self-pay | Admitting: Women's Health

## 2016-05-26 ENCOUNTER — Ambulatory Visit (INDEPENDENT_AMBULATORY_CARE_PROVIDER_SITE_OTHER): Payer: 59 | Admitting: Women's Health

## 2016-05-26 VITALS — BP 120/78 | Ht 68.0 in

## 2016-05-26 DIAGNOSIS — R946 Abnormal results of thyroid function studies: Secondary | ICD-10-CM | POA: Diagnosis not present

## 2016-05-26 DIAGNOSIS — N912 Amenorrhea, unspecified: Secondary | ICD-10-CM

## 2016-05-26 DIAGNOSIS — N926 Irregular menstruation, unspecified: Secondary | ICD-10-CM

## 2016-05-26 LAB — TSH: TSH: 5.37 m[IU]/L — AB

## 2016-05-26 MED ORDER — MEDROXYPROGESTERONE ACETATE 10 MG PO TABS: 10.0000 mg | ORAL_TABLET | Freq: Every day | ORAL | 0 refills | Status: DC

## 2016-05-26 MED ORDER — DOXYCYCLINE HYCLATE 100 MG PO CAPS: 100.0000 mg | ORAL_CAPSULE | Freq: Two times a day (BID) | ORAL | 0 refills | Status: DC

## 2016-05-26 MED FILL — MEDROXYPROGESTERONE 10 MG T: 10 | 5 days supply | Qty: 5 | Fill #0

## 2016-05-26 MED FILL — DOXYCYCLINE HYC 100 MG TAB: 100 | 3 days supply | Qty: 6 | Fill #0

## 2016-05-26 NOTE — Progress Notes (Signed)
Presents with amenorrhea. Desiring conception. Last cycle induced with Provera, normal 4 day cycle March 6 through the 10th. Had intercourse every other day last month, had tried ovulation predictors that were all negative. Reports has PMS type symptoms of feeling bloated, breast tenderness, moody but no cycle. Had monthly cycles for several months after stopping pills. States feels like she is not ovulating. Husband healthy, partner greater than 10 years. Obese with no weight change. 2013 ultrasound normal ovaries.  Exam: Appears well.  Amenorrhea/irregular cycles  Plan: TSH, prolactin, hCG. If hCG negative Provera 10 mg by mouth daily for 5 days. Semen analysis. Dr. Lily Peer in and reviewed HSG. Vibramycin 100 mg twice a day day before, day of an day after procedure. Instructed to call on first day of next cycle. FSH, estradiol day 3 of next cycle, day 22-25 progesterone will call for lab appointments. Continue prenatal vitamins, aware of safe pregnancy behaviors.

## 2016-05-27 ENCOUNTER — Other Ambulatory Visit: Payer: Self-pay | Admitting: Women's Health

## 2016-05-27 DIAGNOSIS — E038 Other specified hypothyroidism: Secondary | ICD-10-CM

## 2016-05-27 DIAGNOSIS — E039 Hypothyroidism, unspecified: Secondary | ICD-10-CM

## 2016-05-27 LAB — THYROID PROFILE - CHCC
Free Thyroxine Index: 2.7 (ref 1.4–3.8)
T3 Uptake: 27 % (ref 22–35)
T4, Total: 9.9 ug/dL (ref 4.5–12.0)

## 2016-05-27 LAB — HCG, SERUM, QUALITATIVE: PREG SERUM: NEGATIVE

## 2016-05-27 LAB — PROLACTIN: PROLACTIN: 8.5 ng/mL

## 2016-05-27 MED ORDER — LEVOTHYROXINE SODIUM 50 MCG PO TABS
50.0000 ug | ORAL_TABLET | Freq: Every day | ORAL | 3 refills | Status: DC
Start: 2016-05-27 — End: 2018-04-18

## 2016-05-27 MED FILL — LEVOTHYROXINE 50 MCG TABLET: 50 | 30 days supply | Qty: 30 | Fill #0

## 2016-05-29 ENCOUNTER — Encounter: Payer: Self-pay | Admitting: Women's Health

## 2016-06-10 ENCOUNTER — Encounter: Payer: Self-pay | Admitting: Gynecology

## 2016-06-12 ENCOUNTER — Other Ambulatory Visit: Payer: 59

## 2016-06-12 DIAGNOSIS — N926 Irregular menstruation, unspecified: Secondary | ICD-10-CM

## 2016-06-13 LAB — FOLLICLE STIMULATING HORMONE: FSH: 7 m[IU]/mL

## 2016-06-13 LAB — ESTRADIOL: Estradiol: 28 pg/mL

## 2016-06-15 ENCOUNTER — Encounter: Payer: Self-pay | Admitting: Women's Health

## 2016-06-29 MED FILL — LEVOTHYROXINE 50 MCG TABLET: 50 | 30 days supply | Qty: 30 | Fill #1

## 2016-06-30 ENCOUNTER — Encounter: Payer: Self-pay | Admitting: Women's Health

## 2016-07-02 ENCOUNTER — Other Ambulatory Visit: Payer: 59

## 2016-07-02 DIAGNOSIS — N926 Irregular menstruation, unspecified: Secondary | ICD-10-CM | POA: Diagnosis not present

## 2016-07-03 ENCOUNTER — Encounter: Payer: Self-pay | Admitting: Women's Health

## 2016-07-03 LAB — PROGESTERONE: Progesterone: <0.5 ng/mL

## 2016-07-30 MED FILL — LEVOTHYROXINE 50 MCG TABLET: 50 | 30 days supply | Qty: 30 | Fill #2

## 2016-08-14 DIAGNOSIS — E282 Polycystic ovarian syndrome: Secondary | ICD-10-CM | POA: Diagnosis not present

## 2016-08-14 DIAGNOSIS — E288 Other ovarian dysfunction: Secondary | ICD-10-CM | POA: Diagnosis not present

## 2016-08-14 DIAGNOSIS — Z3161 Procreative counseling and advice using natural family planning: Secondary | ICD-10-CM | POA: Diagnosis not present

## 2016-08-17 DIAGNOSIS — E288 Other ovarian dysfunction: Secondary | ICD-10-CM | POA: Diagnosis not present

## 2016-08-17 MED FILL — MEDROXYPROGESTERONE 10 MG T: 10 | 10 days supply | Qty: 10 | Fill #0

## 2016-08-17 MED FILL — OVIDREL 250 MCG/0.5 ML SYRG: 250 | 1 days supply | Qty: 1 | Fill #0

## 2016-08-17 MED FILL — metFORMIN HCL 1000 MG TABS: 1000 | 30 days supply | Qty: 60 | Fill #0

## 2016-08-17 MED FILL — LETROZOLE 2.5 MG TABLET: 2.5 | 5 days supply | Qty: 15 | Fill #0

## 2016-08-22 DIAGNOSIS — Z319 Encounter for procreative management, unspecified: Secondary | ICD-10-CM | POA: Diagnosis not present

## 2016-08-31 MED FILL — LEVOTHYROXINE 50 MCG TABLET: 50 | 30 days supply | Qty: 30 | Fill #3

## 2016-09-09 DIAGNOSIS — Z319 Encounter for procreative management, unspecified: Secondary | ICD-10-CM | POA: Diagnosis not present

## 2016-09-18 DIAGNOSIS — E288 Other ovarian dysfunction: Secondary | ICD-10-CM | POA: Diagnosis not present

## 2016-09-24 MED FILL — LETROZOLE 2.5 MG TABLET: 2.5 | 5 days supply | Qty: 15 | Fill #1

## 2016-09-24 MED FILL — metFORMIN HCL 1000 MG TABS: 1000 | 30 days supply | Qty: 60 | Fill #1

## 2016-09-25 MED FILL — LEVOTHYROXINE 50 MCG TABLET: 50 | 28 days supply | Qty: 36 | Fill #0

## 2016-09-30 MED FILL — OVIDREL 250 MCG/0.5 ML SYRG: 250 | 1 days supply | Qty: 1 | Fill #1

## 2016-10-08 DIAGNOSIS — Z319 Encounter for procreative management, unspecified: Secondary | ICD-10-CM | POA: Diagnosis not present

## 2016-10-19 MED FILL — LEVOTHYROXINE 50 MCG TABLET: 50 | 28 days supply | Qty: 36 | Fill #1

## 2016-10-27 MED FILL — LETROZOLE 2.5 MG TABLET: 2.5 | 5 days supply | Qty: 15 | Fill #2

## 2016-10-27 MED FILL — OVIDREL 250 MCG/0.5 ML SYRG: 250 | 1 days supply | Qty: 1 | Fill #2

## 2016-10-30 MED FILL — traMADol HCL 50 MG TABS: 50 | 2 days supply | Qty: 6 | Fill #0

## 2016-10-30 MED FILL — PROMETHAZINE 12.5 MG TABLET: 12.5 | 2 days supply | Qty: 8 | Fill #0

## 2016-11-05 DIAGNOSIS — E282 Polycystic ovarian syndrome: Secondary | ICD-10-CM | POA: Diagnosis not present

## 2016-11-05 DIAGNOSIS — Q514 Unicornate uterus: Secondary | ICD-10-CM | POA: Diagnosis not present

## 2016-11-05 DIAGNOSIS — Z3141 Encounter for fertility testing: Secondary | ICD-10-CM | POA: Diagnosis not present

## 2016-11-05 DIAGNOSIS — Z319 Encounter for procreative management, unspecified: Secondary | ICD-10-CM | POA: Diagnosis not present

## 2016-11-05 DIAGNOSIS — N808 Other endometriosis: Secondary | ICD-10-CM | POA: Diagnosis not present

## 2016-11-05 DIAGNOSIS — N858 Other specified noninflammatory disorders of uterus: Secondary | ICD-10-CM | POA: Diagnosis not present

## 2016-11-06 ENCOUNTER — Telehealth: Payer: Self-pay

## 2016-11-06 NOTE — Telephone Encounter (Addendum)
Called patient back to let her know [place] off TH/Fri but no answer and her voice mail box was full.

## 2016-11-06 NOTE — Telephone Encounter (Signed)
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 Currently on Femara and has F/U sched. Had [facility] in 2013 not visible then

## 2016-11-06 NOTE — Telephone Encounter (Signed)
Patient left voice mail message. Said she wanted to speak with you about a finding from the infertility specialist. [place] Sp[place]iel too much information to leave a brief idea in voice mail.

## 2016-11-12 MED FILL — metFORMIN HCL 1000 MG TABS: 1000 | 30 days supply | Qty: 60 | Fill #2

## 2016-11-17 MED FILL — LEVOTHYROXINE 50 MCG TABLET: 50 | 28 days supply | Qty: 36 | Fill #2

## 2016-11-23 MED FILL — LETROZOLE 2.5 MG TABLET: 2.5 | 5 days supply | Qty: 15 | Fill #3

## 2016-11-24 MED FILL — BD NEEDLES 30GX0.5": 30G X 1/2" | 30 days supply | Qty: 30 | Fill #0

## 2016-11-24 MED FILL — BD NEEDLES 30GX0.5: 30G X 1/2" | 30 days supply | Qty: 30 | Fill #0

## 2016-11-24 MED FILL — BD 3 ML SYRINGE 18GX1-1/2: 18G X 1-1/2 | 30 days supply | Qty: 30 | Fill #0

## 2016-11-24 MED FILL — BD 3 ML SYRINGE 18GX1-1/2": 18G X 1-1/2 | 30 days supply | Qty: 30 | Fill #0

## 2016-11-24 MED FILL — MENOPUR 75 UNIT VIAL: 75 | 30 days supply | Qty: 30 | Fill #0

## 2016-11-30 ENCOUNTER — Other Ambulatory Visit: Payer: Self-pay | Admitting: Women's Health

## 2016-11-30 DIAGNOSIS — Z319 Encounter for procreative management, unspecified: Secondary | ICD-10-CM | POA: Diagnosis not present

## 2016-11-30 DIAGNOSIS — E282 Polycystic ovarian syndrome: Secondary | ICD-10-CM | POA: Diagnosis not present

## 2016-11-30 MED ORDER — BETAMETHASONE VALERATE 0.1 % EX CREA: TOPICAL_CREAM | Freq: Two times a day (BID) | CUTANEOUS | 0 refills | Status: DC

## 2016-11-30 MED FILL — BETAMETHASONE VALERATE 0.1: 0.1 | 30 days supply | Qty: 45 | Fill #0

## 2016-12-02 MED FILL — PROGESTERONE 200 MG CAPSULE: 200 | 30 days supply | Qty: 60 | Fill #0

## 2016-12-03 DIAGNOSIS — N970 Female infertility associated with anovulation: Secondary | ICD-10-CM | POA: Diagnosis not present

## 2016-12-03 DIAGNOSIS — E282 Polycystic ovarian syndrome: Secondary | ICD-10-CM | POA: Diagnosis not present

## 2016-12-03 DIAGNOSIS — Z319 Encounter for procreative management, unspecified: Secondary | ICD-10-CM | POA: Diagnosis not present

## 2016-12-18 MED FILL — LEVOTHYROXINE 50 MCG TABLET: 50 | 40 days supply | Qty: 40 | Fill #0

## 2016-12-22 MED FILL — metFORMIN HCL 1000 MG TABS: 1000 | 30 days supply | Qty: 60 | Fill #3

## 2016-12-22 MED FILL — LETROZOLE 2.5 MG TABLET: 2.5 | 5 days supply | Qty: 15 | Fill #0

## 2016-12-23 DIAGNOSIS — N970 Female infertility associated with anovulation: Secondary | ICD-10-CM | POA: Diagnosis not present

## 2016-12-23 DIAGNOSIS — Z319 Encounter for procreative management, unspecified: Secondary | ICD-10-CM | POA: Diagnosis not present

## 2016-12-23 DIAGNOSIS — E282 Polycystic ovarian syndrome: Secondary | ICD-10-CM | POA: Diagnosis not present

## 2016-12-28 DIAGNOSIS — E282 Polycystic ovarian syndrome: Secondary | ICD-10-CM | POA: Diagnosis not present

## 2016-12-28 DIAGNOSIS — Z319 Encounter for procreative management, unspecified: Secondary | ICD-10-CM | POA: Diagnosis not present

## 2016-12-28 DIAGNOSIS — N970 Female infertility associated with anovulation: Secondary | ICD-10-CM | POA: Diagnosis not present

## 2016-12-29 MED FILL — OVIDREL 250 MCG/0.5 ML SYRG: 250 | 1 days supply | Qty: 1 | Fill #3

## 2017-01-15 MED FILL — LEVOTHYROXINE 50 MCG TABLET: 50 | 28 days supply | Qty: 36 | Fill #0

## 2017-01-29 DIAGNOSIS — Z319 Encounter for procreative management, unspecified: Secondary | ICD-10-CM | POA: Diagnosis not present

## 2017-01-29 DIAGNOSIS — E282 Polycystic ovarian syndrome: Secondary | ICD-10-CM | POA: Diagnosis not present

## 2017-01-29 DIAGNOSIS — Q514 Unicornate uterus: Secondary | ICD-10-CM | POA: Diagnosis not present

## 2017-02-05 MED FILL — metFORMIN HCL 1000 MG TABS: 1000 | 30 days supply | Qty: 60 | Fill #4

## 2017-02-16 MED FILL — LEVOTHYROXINE 50 MCG TABLET: 50 | 28 days supply | Qty: 36 | Fill #1

## 2017-02-17 DIAGNOSIS — Z319 Encounter for procreative management, unspecified: Secondary | ICD-10-CM | POA: Diagnosis not present

## 2017-02-17 DIAGNOSIS — Q514 Unicornate uterus: Secondary | ICD-10-CM | POA: Diagnosis not present

## 2017-02-17 DIAGNOSIS — N970 Female infertility associated with anovulation: Secondary | ICD-10-CM | POA: Diagnosis not present

## 2017-02-17 DIAGNOSIS — Z113 Encounter for screening for infections with a predominantly sexual mode of transmission: Secondary | ICD-10-CM | POA: Diagnosis not present

## 2017-02-17 DIAGNOSIS — E282 Polycystic ovarian syndrome: Secondary | ICD-10-CM | POA: Diagnosis not present

## 2017-02-17 MED FILL — NORETHINDRONE 5 MG TABLET: 5 | 10 days supply | Qty: 10 | Fill #0

## 2017-02-18 MED FILL — METHYLPREDNISOLONE 4 MG TAB: 4 | 4 days supply | Qty: 16 | Fill #0

## 2017-02-18 MED FILL — GONAL-F 1,050 UNITS VIAL: 1050 | 13 days supply | Qty: 3 | Fill #0

## 2017-02-18 MED FILL — MENOPUR 75 UNIT VIAL: 75 | 20 days supply | Qty: 20 | Fill #0

## 2017-02-18 MED FILL — BD 3 ML SYRINGE 18GX1-1/2": 18G X 1-1/2 | 30 days supply | Qty: 60 | Fill #0

## 2017-02-18 MED FILL — BD NEEDLES 30GX0.5": 30G X 1/2" | 15 days supply | Qty: 15 | Fill #0

## 2017-02-18 MED FILL — BD NEEDLES 22GX1.5": 22G X 1-1/2 | 30 days supply | Qty: 30 | Fill #0

## 2017-02-18 MED FILL — DOXYCYCLINE HYCLATE 100 MG: 100 | 20 days supply | Qty: 40 | Fill #0

## 2017-02-18 MED FILL — PREGNYL 10,000 UNITS VIAL: 10000 | 1 days supply | Qty: 1 | Fill #0

## 2017-02-18 MED FILL — BD NEEDLES 22GX1.5: 22G X 1-1/2 | 30 days supply | Qty: 30 | Fill #0

## 2017-02-18 MED FILL — BD 3 ML SYRINGE 18GX1-1/2: 18G X 1-1/2 | 30 days supply | Qty: 60 | Fill #0

## 2017-02-18 MED FILL — BD NEEDLES 30GX0.5: 30G X 1/2" | 15 days supply | Qty: 15 | Fill #0

## 2017-02-25 MED FILL — NORETHINDRONE 5 MG TABLET: 5 | 5 days supply | Qty: 5 | Fill #0

## 2017-03-03 MED FILL — PROGESTERONE OIL 50 MG/ML V: 50 | 30 days supply | Qty: 30 | Fill #0

## 2017-03-05 DIAGNOSIS — Z3183 Encounter for assisted reproductive fertility procedure cycle: Secondary | ICD-10-CM | POA: Diagnosis not present

## 2017-03-11 DIAGNOSIS — Z3183 Encounter for assisted reproductive fertility procedure cycle: Secondary | ICD-10-CM | POA: Diagnosis not present

## 2017-03-13 DIAGNOSIS — Z3183 Encounter for assisted reproductive fertility procedure cycle: Secondary | ICD-10-CM | POA: Diagnosis not present

## 2017-03-15 DIAGNOSIS — Z3183 Encounter for assisted reproductive fertility procedure cycle: Secondary | ICD-10-CM | POA: Diagnosis not present

## 2017-03-15 MED FILL — LEVOTHYROXINE 50 MCG TABLET: 50 | 28 days supply | Qty: 36 | Fill #2

## 2017-03-15 MED FILL — metFORMIN HCL 1000 MG TABS: 1000 | 30 days supply | Qty: 60 | Fill #5

## 2017-03-17 DIAGNOSIS — Z3183 Encounter for assisted reproductive fertility procedure cycle: Secondary | ICD-10-CM | POA: Diagnosis not present

## 2017-03-19 DIAGNOSIS — Z3183 Encounter for assisted reproductive fertility procedure cycle: Secondary | ICD-10-CM | POA: Diagnosis not present

## 2017-03-19 DIAGNOSIS — E039 Hypothyroidism, unspecified: Secondary | ICD-10-CM | POA: Diagnosis not present

## 2017-03-19 MED FILL — OXYCOD/ACETAMINOPHEN 5-325M: 5-325 | 2 days supply | Qty: 10 | Fill #0

## 2017-03-19 MED FILL — PROMETHAZINE 12.5 MG TABLET: 12.5 | 2 days supply | Qty: 10 | Fill #0

## 2017-03-22 DIAGNOSIS — Z3183 Encounter for assisted reproductive fertility procedure cycle: Secondary | ICD-10-CM | POA: Diagnosis not present

## 2017-03-24 MED FILL — ESTRADIOL 0.1 MG PATCH: 0.1 | 28 days supply | Qty: 8 | Fill #0

## 2017-03-24 MED FILL — METHYLPREDNISOLONE 4 MG TAB: 4 | 4 days supply | Qty: 16 | Fill #0

## 2017-03-24 MED FILL — ESTRADIOL 2 MG TABS: 2 | 30 days supply | Qty: 60 | Fill #0

## 2017-03-26 ENCOUNTER — Encounter (HOSPITAL_COMMUNITY): Payer: Self-pay

## 2017-03-26 ENCOUNTER — Inpatient Hospital Stay (HOSPITAL_COMMUNITY): Payer: 59

## 2017-03-26 ENCOUNTER — Inpatient Hospital Stay (HOSPITAL_COMMUNITY)
Admit: 2017-03-26 | Discharge: 2017-03-26 | Disposition: A | Discharge status: Home / Self Care | Payer: 59 | Attending: Obstetrics and Gynecology | Admitting: Obstetrics and Gynecology

## 2017-03-26 ENCOUNTER — Inpatient Hospital Stay (HOSPITAL_COMMUNITY)
Admission: AD | Admit: 2017-03-26 | Discharge: 2017-03-26 | Disposition: A | Discharge status: Home / Self Care | Payer: 59 | Source: Ambulatory Visit | Attending: Obstetrics and Gynecology | Admitting: Obstetrics and Gynecology

## 2017-03-26 DIAGNOSIS — I10 Essential (primary) hypertension: Secondary | ICD-10-CM | POA: Diagnosis not present

## 2017-03-26 DIAGNOSIS — Z7982 Long term (current) use of aspirin: Secondary | ICD-10-CM | POA: Insufficient documentation

## 2017-03-26 DIAGNOSIS — R0602 Shortness of breath: Secondary | ICD-10-CM | POA: Diagnosis not present

## 2017-03-26 DIAGNOSIS — J90 Pleural effusion, not elsewhere classified: Secondary | ICD-10-CM

## 2017-03-26 DIAGNOSIS — I2699 Other pulmonary embolism without acute cor pulmonale: Secondary | ICD-10-CM | POA: Diagnosis not present

## 2017-03-26 DIAGNOSIS — R7989 Other specified abnormal findings of blood chemistry: Secondary | ICD-10-CM | POA: Insufficient documentation

## 2017-03-26 LAB — CBC
HCT: 40.2 % (ref 36.0–46.0)
HEMOGLOBIN: 13.6 g/dL (ref 12.0–15.0)
MCH: 29.4 pg (ref 26.0–34.0)
MCHC: 33.8 g/dL (ref 30.0–36.0)
MCV: 87 fL (ref 78.0–100.0)
PLATELETS: 296 10*3/uL (ref 150–400)
RBC: 4.62 MIL/uL (ref 3.87–5.11)
RDW: 14 % (ref 11.5–15.5)
WBC: 11.4 10*3/uL — ABNORMAL HIGH (ref 4.0–10.5)

## 2017-03-26 LAB — COMPREHENSIVE METABOLIC PANEL
ALBUMIN: 3.8 g/dL (ref 3.5–5.0)
ALK PHOS: 78 U/L (ref 38–126)
ALT: 345 U/L — ABNORMAL HIGH (ref 14–54)
AST: 93 U/L — ABNORMAL HIGH (ref 15–41)
Anion gap: 9 (ref 5–15)
BUN: 13 mg/dL (ref 6–20)
CALCIUM: 9.4 mg/dL (ref 8.9–10.3)
CHLORIDE: 103 mmol/L (ref 101–111)
CO2: 21 mmol/L — AB (ref 22–32)
Creatinine, Ser: 0.46 mg/dL (ref 0.44–1.00)
GFR calc Af Amer: >60 mL/min (ref >60)
GFR calc non Af Amer: >60 mL/min (ref >60)
GLUCOSE: 84 mg/dL (ref 65–99)
POTASSIUM: 4.2 mmol/L (ref 3.5–5.1)
SODIUM: 133 mmol/L — AB (ref 135–145)
Total Bilirubin: 0.6 mg/dL (ref 0.3–1.2)
Total Protein: 8.1 g/dL (ref 6.5–8.1)

## 2017-03-26 MED ORDER — HYDROCORTISONE NA SUCCINATE PF 100 MG IJ SOLR
200.0000 mg | Freq: Once | INTRAMUSCULAR | Status: AC
  Administered 2017-03-26: 200 mg via INTRAVENOUS
  Filled 2017-03-26: qty 4

## 2017-03-26 MED ORDER — IOPAMIDOL (ISOVUE-370) INJECTION 76%
100.0000 mL | Freq: Once | INTRAVENOUS | Status: AC | PRN
  Administered 2017-03-26: 100 mL via INTRAVENOUS

## 2017-03-26 MED ORDER — DIPHENHYDRAMINE HCL 50 MG/ML IJ SOLN
50.0000 mg | Freq: Once | INTRAMUSCULAR | Status: AC
  Administered 2017-03-26: 50 mg via INTRAVENOUS
  Filled 2017-03-26: qty 1

## 2017-03-26 NOTE — MAU Note (Signed)
Pt is s/p an egg retrieval on 2/25 c/o RUQ pain and SOB.  Pt sent for work up to r/o PE vs. Pleural effusion

## 2017-03-26 NOTE — Discharge Instructions (Signed)
Pleural Effusion °A pleural effusion is an abnormal buildup of fluid in the layers of tissue between your lungs and the inside of your chest (pleural space). These two layers of tissue that line both your lungs and the inside of your chest are called pleura. Usually, there is no air in the space between the pleura, only a thin layer of fluid. If left untreated, a large amount of fluid can build up and cause the lung to collapse. A pleural effusion is usually caused by another disease that requires treatment. °The two main types of pleural effusion are: °· Transudative pleural effusion. This happens when fluid leaks into the pleural space because of a low protein count in your blood or high blood pressure in your vessels. Heart failure often causes this. °· Exudative infusion. This occurs when fluid collects in the pleural space from blocked blood vessels or lymph vessels. Some lung diseases, injuries, and cancers can cause this type of effusion. ° °What are the causes? °Pleural effusion can be caused by: °· Heart failure. °· A blood clot in the lung (pulmonary embolism). °· Pneumonia. °· Cancer. °· Liver failure (cirrhosis). °· Kidney disease. °· Complications from surgery, such as from open heart surgery. ° °What are the signs or symptoms? °In some cases, pleural effusion may cause no symptoms. Symptoms can include: °· Shortness of breath, especially when lying down. °· Chest pain, often worse when taking a deep breath. °· Fever. °· Dry cough that is lasting (chronic). °· Hiccups. °· Rapid breathing. ° °An underlying condition that is causing the pleural effusion (such as heart failure, pneumonia, blood clots, tuberculosis, or cancer) may also cause additional symptoms. °How is this diagnosed? °Your health care provider may suspect pleural effusion based on your symptoms and medical history. Your health care provider will also do a physical exam and a chest X-ray. If the X-ray shows there is fluid in your chest,  you may need to have this fluid removed using a needle (thoracentesis) so it can be tested. °You may also have: °· Imaging studies of the chest, such as: °? Ultrasound. °? CT scan. °· Blood tests for kidney and liver function. ° °How is this treated? °Treatment depends on the cause of the pleural effusion. Treatment may include: °· Taking antibiotic medicines to clear up an infection that is causing the pleural effusion. °· Placing a tube in the chest to drain the effusion (tube thoracostomy). This procedure is often used when there is an infection in the fluid. °· Surgery to remove the fibrous outer layer of tissue from the pleural space (decortication). °· Thoracentesis, which can improve cough and shortness of breath. °· A procedure to put medicine into the chest cavity to seal the pleural space to prevent fluid buildup (pleurodesis). °· Chemotherapy and radiation therapy. These may be required in the case of cancerous (malignant) pleural effusion. ° °Follow these instructions at home: °· Take medicines only as directed by your health care provider. °· Keep track of how long you can gently exercise before you get short of breath. Try simply walking at first. °· Do not use any tobacco products, including cigarettes, chewing tobacco, or electronic cigarettes. If you need help quitting, ask your health care provider. °· Keep all follow-up visits as directed by your health care provider. This is important. °Contact a health care provider if: °· The amount of time that you are able to exercise decreases or does not improve with time. °· You have pain or signs of infection   at the puncture site if you had thoracentesis. Watch for: °? Drainage. °? Redness. °? Swelling. °· You have a fever. °Get help right away if: °· You are short of breath. °· You develop chest pain. °· You develop a new cough. °This information is not intended to replace advice given to you by your health care provider. Make sure you discuss any  questions you have with your health care provider. °Document Released: 01/12/2005 Document Revised: 06/17/2015 Document Reviewed: 06/07/2013 °Elsevier Interactive Patient Education © 2018 Elsevier Inc. ° °

## 2017-03-26 NOTE — MAU Note (Signed)
[facility] V/SS

## 2017-03-26 NOTE — MAU Provider Note (Addendum)
History     CSN: [id number]  Arrival date and time: 03/26/17 1346   First Provider Initiated Contact with Patient 03/26/17 1438      Chief Complaint  Patient presents with  . Shortness of Breath  . Chest Pain   HPI  Ms.  Melissa Torres is a 32 y.o. year old G0P0 female who was sent to MAU to R/O PE vs Pleural effusion. She had egg retrieval on 2/25 and now she c/o RUQ pain and SOB. She needs to sit straight up to feel like she can breathe. Dr. April Manson sent her here for a CXR and possible CT scan.  Past Medical History:  Diagnosis Date  . Hypoglycemia   . Internal derangement of right knee     Past Surgical History:  Procedure Laterality Date  . egg retrieval    . KNEE ARTHROSCOPY Right 11/07/2012   Procedure: RIGHT KNEE ARTHROSCOPY PLICA RESECTION DEBRIDEMENT AND CHONDROPLASTY ;  Surgeon: Eugenia Mcalpine, MD;  [facility]: [facility];  Service: Orthopedics;  Laterality: Right;  . TONSILLECTOMY AND ADENOIDECTOMY  AGE 63    Family History  Problem Relation Age of Onset  . Hypertension Mother   . Diabetes Mother   . Hypertension Maternal Grandmother   . Heart failure Maternal Grandmother   . Diabetes Maternal Grandfather     Social History   Tobacco Use  . Smoking status: Never Smoker  . Smokeless tobacco: Never Used  Substance Use Topics  . Alcohol use: Yes    Comment: occassionally  . Drug use: No    Allergies:  Allergies  Allergen Reactions  . Ivp Dye [Iodinated Diagnostic Agents] Hives and Itching  . Other     Steroids- hallucinations, anxiety  . Latex Itching and Rash    SEVERE ITCHING    Medications Prior to Admission  Medication Sig Dispense Refill Last Dose  . aspirin (ASPIRIN EC) 81 MG EC tablet Take 81 mg by mouth daily. Swallow whole.   03/26/2017 at Unknown time  . betamethasone valerate (VALISONE) 0.1 % cream Apply 2 (two) times daily topically. 45 g 0 prn  . levothyroxine (SYNTHROID, LEVOTHROID) 50 MCG tablet Take 1 tablet  (50 mcg total) by mouth daily. 30 tablet 3 03/26/2017 at Unknown time  . metFORMIN (GLUCOPHAGE) 1000 MG tablet Take by mouth. 1 tablet in the morning and 1/2 tablet at night   03/26/2017 at Unknown time  . oxyCODONE-acetaminophen (PERCOCET/ROXICET) 5-325 MG tablet Take 1-2 tablets by mouth every 6 (six) hours as needed for severe pain.   Past Month at Unknown time  . Probiotic Product (PROBIOTIC PO) Take 1 tablet by mouth daily.   03/26/2017 at Unknown time  . doxycycline (VIBRAMYCIN) 100 MG capsule Take 1 capsule (100 mg total) by mouth 2 (two) times daily. Take day prior to HSG, date of and day after. 6 capsule 0   . estradiol (ESTRACE) 2 MG tablet Take 1 tablet by mouth 2 (two) times daily.   not started at Unknown time  . estradiol (VIVELLE-DOT) 0.1 MG/24HR patch Place 1 patch onto the skin 2 (two) times a week.    not started at Unknown time  . GONAL-F 1050 units SOLR Inject 1,050 Units into the skin once.    03/19/2017  . Leuprolide Acetate (LUPRON [place]) INJECT 10 UNITS SUB CUTANEOUSLY DAILY AS DIRECTED  REFRIGERATE AFTER FIRST USE  1 03/19/2017  . medroxyPROGESTERone (PROVERA) 10 MG tablet Take 1 tablet (10 mg total) by mouth daily. 5 tablet 0   .  OVIDREL 250 MCG/0.5ML injection Inject 250 mcg into the skin once.    03/19/2017  . PREGNYL [id number] units injection Inject 10,000 Units as directed once.    03/20/2017  . progesterone 50 MG/ML injection Inject 50 mg into the muscle as directed.    not started    Review of Systems  Constitutional: Negative.   HENT: Negative.   Eyes: Negative.   Respiratory: Positive for shortness of breath.   Cardiovascular: Negative.   Gastrointestinal: Positive for abdominal pain (RUQ pain).  Endocrine: Negative.   Musculoskeletal: Negative.   Skin: Negative.   Allergic/Immunologic: Negative.   Neurological: Negative.   Hematological: Negative.   Psychiatric/Behavioral: Negative.    Physical Exam   Blood pressure (!) 142/78, pulse (!) 108, temperature 98.6 F (37  C), temperature source Oral, resp. rate 20, SpO2 97 %.  Physical Exam  Nursing note and vitals reviewed. Constitutional: She is oriented to person, place, and time. She appears well-developed and well-nourished.  HENT:  Head: Normocephalic and atraumatic.  Eyes: Pupils are equal, round, and reactive to light.  Neck: Normal range of motion.  Cardiovascular: Normal rate, regular rhythm and normal heart sounds.  Respiratory: Accessory muscle usage present. She is in respiratory distress (mild). She has rhonchi in the right middle field, the right lower field, the left middle field and the left lower field.  Mildly labored breathing  GI: There is tenderness (RUQ, just under ribs).  Genitourinary:  Genitourinary Comments: Pelvic not indicated  Musculoskeletal: Normal range of motion.  Neurological: She is alert and oriented to person, place, and time.  Skin: Skin is warm and dry.  Psychiatric: She has a normal mood and affect. Her behavior is normal. Judgment and thought content normal.   Pt in mild distress. Having to sit up to breathe.  Lungs: CTA. NO wheezes, rales or rhonchi  CV: RRR; no murmers Chest wall; tender to light palpation on right side in a discrete area about 8 cm.  There is no bruising.      MAU Course  Procedures  MDM Pt with pain in chest wall on the right side. She also c/o SOB and DOE. She is a pt of Dr. Margarite GougeYalcinkiya and is undergoing a infertility eval. She is s/pegg retrieval last week. She reports that since that time she has had pain in her plower abd. Her husband reports that since that time she has had some difficulty getting up off the couch and has been protecting the left side.  She has used some topical pain gel with relief on the left side. That has led to immediate relief of the pain.         The pt reports an allergy to IV contrast and had severe generalized itching when she was last exposed. She is worried about her rxn to steroids because of erratic  behavior when she last took steroids.   I have explained to her the risk of the a PE given the meds she has been on but, her clinical picture of more consistent with a costochondritis. She agrees with the need to r/o PE. [facility]. If neg may discharge to home on NSAIDS.  I spoke to Dr. Margarite GougeYalcinkiya twice about the care of theis pt.   Carolyn L. Harraway-Smith, M.D., FACOG  - Premedication medications ordered per radiology protocol: Hydrocortisone 200 mg IV 4 hrs prior to procedure, Benadryl  50 mg IV 1 hr prior to procedure  Report given to and care assumed by Vonzella Nipple, PA-C @ [address] MSN, CNM 03/26/2017, 4:25 PM   Dg Chest 2 View  Result Date: 03/26/2017 CLINICAL DATA:  Shortness of breath and hypertension EXAM: CHEST  2 VIEW COMPARISON:  None. FINDINGS: Lungs are clear. Heart size and pulmonary vascularity are normal. No adenopathy. No bone lesions. IMPRESSION: No edema or consolidation. Electronically Signed   By: Bretta Bang III M.D.   On: 03/26/2017 14:53   Ct Angio Chest Pe W Or Wo Contrast  Result Date: 03/26/2017 CLINICAL DATA:  Shortness of breath and right upper quadrant pain EXAM: CT ANGIOGRAPHY CHEST WITH CONTRAST TECHNIQUE: Multidetector CT imaging of the chest was performed using the standard protocol during bolus administration of intravenous contrast. Multiplanar CT image reconstructions and MIPs were obtained to evaluate the vascular anatomy. CONTRAST:  ISOVUE-370 IOPAMIDOL (ISOVUE-370) INJECTION 76% COMPARISON:  Chest x-ray 03/26/2017 FINDINGS: Cardiovascular: Poor opacification of the pulmonary arterial system which limits evaluation for emboli. No definite acute central embolus is seen. Nonaneurysmal aorta. Normal heart size. No pericardial effusion Mediastinum/Nodes: No enlarged mediastinal, hilar, or axillary lymph nodes. Thyroid gland, trachea, and  esophagus demonstrate no significant findings. Lungs/Pleura: No consolidation or pneumothorax. Small right pleural effusion Upper Abdomen: No acute abnormality. Musculoskeletal: No chest wall abnormality. No acute or significant osseous findings. Review of the MIP images confirms the above findings. IMPRESSION: 1. Limited evaluation for emboli due to poor contrast opacification of the pulmonary arterial system. No gross acute central embolus is seen. 2. Small right-sided pleural effusion. Electronically Signed   By: Jasmine Pang M.D.   On: 03/26/2017 22:09   Unable to reach Dr. April Manson. Spoke with Dr. Shawnie Pons. Patient will need to monitor symptoms and eventually they will resolve. Return with worsening symptoms.  LFTs elevated. Discussed with Dr. Shawnie Pons. Add Hepatitis panel. Dr. April Manson will need to follow in the office to ensure resolutions/improvement. Likely related to medications from IVF process.  Assessment and Plan  A: Small pleural effusion Elevated LFTs   P:  Discharge home Warning signs for worsening condition discussed Patient advised to follow-up with Dr. April Manson as scheduled or sooner PRN Patient may return to MAU as needed or if her condition were to change or worsen  Marny Lowenstein, PA-C 03/26/2017 11:43 PM

## 2017-03-27 DIAGNOSIS — Z3183 Encounter for assisted reproductive fertility procedure cycle: Secondary | ICD-10-CM | POA: Diagnosis not present

## 2017-03-27 DIAGNOSIS — R0602 Shortness of breath: Secondary | ICD-10-CM | POA: Diagnosis not present

## 2017-03-28 DIAGNOSIS — Z3183 Encounter for assisted reproductive fertility procedure cycle: Secondary | ICD-10-CM | POA: Diagnosis not present

## 2017-03-29 ENCOUNTER — Encounter: Payer: Self-pay | Admitting: Medical

## 2017-03-29 ENCOUNTER — Encounter: Payer: Self-pay | Admitting: Women's Health

## 2017-03-29 ENCOUNTER — Telehealth: Payer: Self-pay | Admitting: Certified Nurse Midwife

## 2017-03-29 LAB — HEPATITIS PANEL, ACUTE
HCV Ab: <0.1 s/co ratio (ref 0.0–0.9)
HEP B S AG: NEGATIVE
Hep A IgM: NEGATIVE
Hep B C IgM: NEGATIVE

## 2017-03-29 NOTE — Telephone Encounter (Signed)
Pt calling, requesting LFT results. Notified pt lab is still pending. Ordering provider will notify her when resulted.

## 2017-04-09 DIAGNOSIS — Z3183 Encounter for assisted reproductive fertility procedure cycle: Secondary | ICD-10-CM | POA: Diagnosis not present

## 2017-04-09 DIAGNOSIS — E039 Hypothyroidism, unspecified: Secondary | ICD-10-CM | POA: Diagnosis not present

## 2017-04-11 MED FILL — LEVOTHYROXINE 50 MCG TABLET: 50 | 28 days supply | Qty: 36 | Fill #3

## 2017-04-15 DIAGNOSIS — Z3183 Encounter for assisted reproductive fertility procedure cycle: Secondary | ICD-10-CM | POA: Diagnosis not present

## 2017-04-23 DIAGNOSIS — Z3201 Encounter for pregnancy test, result positive: Secondary | ICD-10-CM | POA: Diagnosis not present

## 2017-04-23 DIAGNOSIS — E039 Hypothyroidism, unspecified: Secondary | ICD-10-CM | POA: Diagnosis not present

## 2017-04-23 DIAGNOSIS — Z3183 Encounter for assisted reproductive fertility procedure cycle: Secondary | ICD-10-CM | POA: Diagnosis not present

## 2017-04-23 DIAGNOSIS — Z3200 Encounter for pregnancy test, result unknown: Secondary | ICD-10-CM | POA: Diagnosis not present

## 2017-04-26 DIAGNOSIS — Z3200 Encounter for pregnancy test, result unknown: Secondary | ICD-10-CM | POA: Diagnosis not present

## 2017-04-26 DIAGNOSIS — Z3201 Encounter for pregnancy test, result positive: Secondary | ICD-10-CM | POA: Diagnosis not present

## 2017-04-27 MED FILL — ESTRADIOL 0.1 MG PATCH: 0.1 | 28 days supply | Qty: 8 | Fill #1

## 2017-04-29 MED FILL — metFORMIN HCL 1000 MG TABS: 1000 | 30 days supply | Qty: 60 | Fill #6

## 2017-04-29 MED FILL — ESTRADIOL 2 MG TABS: 2 | 30 days supply | Qty: 60 | Fill #1

## 2017-05-03 MED FILL — PROGESTERONE OIL 50 MG/ML V: 50 | 30 days supply | Qty: 30 | Fill #1

## 2017-05-06 ENCOUNTER — Inpatient Hospital Stay (HOSPITAL_COMMUNITY): Payer: 59

## 2017-05-06 ENCOUNTER — Encounter (HOSPITAL_COMMUNITY): Payer: Self-pay

## 2017-05-06 ENCOUNTER — Inpatient Hospital Stay (HOSPITAL_COMMUNITY)
Admission: AD | Admit: 2017-05-06 | Discharge: 2017-05-06 | Disposition: A | Discharge status: Home / Self Care | Payer: 59 | Source: Ambulatory Visit | Attending: Obstetrics and Gynecology | Admitting: Obstetrics and Gynecology

## 2017-05-06 DIAGNOSIS — Z3A01 Less than 8 weeks gestation of pregnancy: Secondary | ICD-10-CM

## 2017-05-06 DIAGNOSIS — O209 Hemorrhage in early pregnancy, unspecified: Secondary | ICD-10-CM | POA: Diagnosis not present

## 2017-05-06 DIAGNOSIS — Z3491 Encounter for supervision of normal pregnancy, unspecified, first trimester: Secondary | ICD-10-CM

## 2017-05-06 LAB — COMPREHENSIVE METABOLIC PANEL
ALK PHOS: 60 U/L (ref 38–126)
ALT: 29 U/L (ref 14–54)
AST: 18 U/L (ref 15–41)
Albumin: 3.7 g/dL (ref 3.5–5.0)
Anion gap: 10 (ref 5–15)
BUN: 10 mg/dL (ref 6–20)
CALCIUM: 9.3 mg/dL (ref 8.9–10.3)
CHLORIDE: 104 mmol/L (ref 101–111)
CO2: 24 mmol/L (ref 22–32)
CREATININE: 0.63 mg/dL (ref 0.44–1.00)
Glucose, Bld: 120 mg/dL — ABNORMAL HIGH (ref 65–99)
Potassium: 4.1 mmol/L (ref 3.5–5.1)
Sodium: 138 mmol/L (ref 135–145)
TOTAL PROTEIN: 6.8 g/dL (ref 6.5–8.1)
Total Bilirubin: 0.6 mg/dL (ref 0.3–1.2)

## 2017-05-06 LAB — HCG, QUANTITATIVE, PREGNANCY: hCG, Beta Chain, Quant, S: 9518 m[IU]/mL — ABNORMAL HIGH (ref <5)

## 2017-05-06 LAB — URINALYSIS, ROUTINE W REFLEX MICROSCOPIC
Bilirubin Urine: NEGATIVE
Glucose, UA: NEGATIVE mg/dL
Ketones, ur: NEGATIVE mg/dL
LEUKOCYTES UA: NEGATIVE
Nitrite: NEGATIVE
Protein, ur: NEGATIVE mg/dL
SPECIFIC GRAVITY, URINE: 1.015 (ref 1.005–1.030)
pH: 6 (ref 5.0–8.0)

## 2017-05-06 LAB — POCT PREGNANCY, URINE: PREG TEST UR: POSITIVE — AB

## 2017-05-06 NOTE — MAU Note (Signed)
Pt reports vaginal bleeding that started about 1 hour ago and passed 2 clots the largest being quarter-sized. Pt states she is not wearing a pad in triage. Pt denies recent intercourse or vaginal exams. Pt denies pain. Pt states she is [redacted] weeks pregnant and sees Dr. April MansonYalcinkaya. This is an IVF pregnancy.

## 2017-05-06 NOTE — MAU Provider Note (Signed)
History    Chief Complaint  Patient presents with  . Possible Pregnancy  . Vaginal Bleeding   HPI Patient is s/p embryo transfer on 04/15/17. Transferred one egg. She is a patient of Dr. April MansonYalcinkaya and is on Progesterone injections, estrogen for support. She began with light spotting this evening and then had a bit of heavier bleeding with passing of clots. Bleeding has since stopped. Had appropriately rising BHCG's in office. Scheduled for 1st u/s on tomorrow. Denies cramping or pain. Has some mild back pain.  OB History  Gravida Para Term Preterm AB Living  1            SAB TAB Ectopic Multiple Live Births               # Outcome Date GA Lbr Len/2nd Weight Sex Delivery Anes PTL Lv  1 Current             Past Medical History:  Diagnosis Date  . Hypoglycemia   . Internal derangement of right knee     Past Surgical History:  Procedure Laterality Date  . egg retrieval    . KNEE ARTHROSCOPY Right 11/07/2012   Procedure: RIGHT KNEE ARTHROSCOPY PLICA RESECTION DEBRIDEMENT AND CHONDROPLASTY ;  Surgeon: Eugenia Mcalpineobert Collins, MD;  [facility] [place];  Service: Orthopedics;  Laterality: Right;  . TONSILLECTOMY AND ADENOIDECTOMY  AGE 32    Family History  Problem Relation Age of Onset  . Hypertension Mother   . Diabetes Mother   . Hypertension Maternal Grandmother   . Heart failure Maternal Grandmother   . Diabetes Maternal Grandfather     Social History   Tobacco Use  . Smoking status: Never Smoker  . Smokeless tobacco: Never Used  Substance Use Topics  . Alcohol use: Yes    Comment: occassionally  . Drug use: No    Allergies:  Allergies  Allergen Reactions  . Ivp Dye [Iodinated Diagnostic Agents] Hives and Itching  . Other     Steroids- hallucinations, anxiety  . Latex Itching and Rash    SEVERE ITCHING    Medications Prior to Admission  Medication Sig Dispense Refill Last Dose  . aspirin (ASPIRIN EC) 81 MG EC tablet Take 81 mg by mouth daily.  Swallow whole.   03/26/2017 at Unknown time  . betamethasone valerate (VALISONE) 0.1 % cream Apply 2 (two) times daily topically. 45 g 0 prn  . estradiol (ESTRACE) 2 MG tablet Take 1 tablet by mouth 2 (two) times daily.   not started at Unknown time  . estradiol (VIVELLE-DOT) 0.1 MG/24HR patch Place 1 patch onto the skin 2 (two) times a week.    not started at Unknown time  . GONAL-F 1050 units SOLR Inject 1,050 Units into the skin once.    03/19/2017  . Leuprolide Acetate (LUPRON ) INJECT 10 UNITS SUB CUTANEOUSLY DAILY AS DIRECTED  REFRIGERATE AFTER FIRST USE  1 03/19/2017  . levothyroxine (SYNTHROID, LEVOTHROID) 50 MCG tablet Take 1 tablet (50 mcg total) by mouth daily. 30 tablet 3 03/26/2017 at Unknown time  . metFORMIN (GLUCOPHAGE) 1000 MG tablet Take by mouth. 1 tablet in the morning and 1/2 tablet at night   03/26/2017 at Unknown time  . OVIDREL 250 MCG/0.5ML injection Inject 250 mcg into the skin once.    03/19/2017  . oxyCODONE-acetaminophen (PERCOCET/ROXICET) 5-325 MG tablet Take 1-2 tablets by mouth every 6 (six) hours as needed for severe pain.   Past Month at Unknown time  . PREGNYL [id number]  units injection Inject 10,000 Units as directed once.    03/20/2017  . Probiotic Product (PROBIOTIC PO) Take 1 tablet by mouth daily.   03/26/2017 at Unknown time  . progesterone 50 MG/ML injection Inject 50 mg into the muscle as directed.    not started    Review of Systems  Constitutional: Negative for chills and fever.  Respiratory: Negative for cough and shortness of breath.   Cardiovascular: Negative for chest pain.  Gastrointestinal: Negative for abdominal pain.  Genitourinary: Positive for frequency. Negative for dysuria and hematuria.  Skin: Negative for rash.   Physical Exam Blood pressure 136/88, pulse (!) 107, temperature 98.6 F (37 C), temperature source Oral, resp. rate 20, height 5\' 8"  (1.727 m), weight 227 lb (103 kg), SpO2 99 %. Physical Exam  Constitutional: She appears well-developed  and well-nourished. No distress.  HENT:  Head: Normocephalic and atraumatic.  Eyes: No scleral icterus.  Neck: Neck supple.  Cardiovascular: Normal rate and regular rhythm.  Respiratory: Effort normal.  GI: Soft. There is no tenderness.  Musculoskeletal: She exhibits no edema.  Neurological: She is alert.  Skin: Skin is warm and dry.  Psychiatric: She has a normal mood and affect.    MAU Course Procedures  MDM Must rule out ectopic, check BHCG Check Blood type for possible Rhogam administration H/o abnl LFT's and OHSS--repeat LFT's  Care turned over to Cleone Slim, CNM

## 2017-05-06 NOTE — Discharge Instructions (Signed)
Pelvic Rest °Pelvic rest may be recommended if: °· Your placenta is partially or completely covering the opening of your cervix (placenta previa). °· There is bleeding between the wall of the uterus and the amniotic sac in the first trimester of pregnancy (subchorionic hemorrhage). °· You went into labor too early (preterm labor). ° °Based on your overall health and the health of your baby, your health care provider will decide if pelvic rest is right for you. °How do I rest my pelvis? °For as long as told by your health care provider: °· Do not have sex, sexual stimulation, or an orgasm. °· Do not use tampons. Do not douche. Do not put anything in your vagina. °· Do not lift anything that is heavier than 10 lb (4.5 kg). °· Avoid activities that take a lot of effort (are strenuous). °· Avoid any activity in which your pelvic muscles could become strained. ° °When should I seek medical care? °Seek medical care if you have: °· Cramping pain in your lower abdomen. °· Vaginal discharge. °· A low, dull backache. °· Regular contractions. °· Uterine tightening. ° °When should I seek immediate medical care? °Seek immediate medical care if: °· You have vaginal bleeding and you are pregnant. ° °This information is not intended to replace advice given to you by your health care provider. Make sure you discuss any questions you have with your health care provider. °Document Released: 05/09/2010 Document Revised: 06/20/2015 Document Reviewed: 07/16/2014 °Elsevier Interactive Patient Education © 2018 [employer]. ° °

## 2017-05-06 NOTE — MAU Provider Note (Signed)
History     CSN: [id number]  Arrival date and time: 05/06/17 4098   First Provider Initiated Contact with Patient 05/06/17 2036      Chief Complaint  Patient presents with  . Possible Pregnancy  . Vaginal Bleeding   HPI Melissa Torres is a 32 y.o. G1P0 at [redacted]w[redacted]d who presents with vaginal bleeding. She had an embryo transfer with Dr. April Manson on 3/21. Today she started having light bleeding that got heavier with clots this afternoon. She reports appropriately rising quants in the office and is scheduled for her first u/s tomorrow. She reports mild, intermittent back pain.   OB History    Gravida  1   Para      Term      Preterm      AB      Living        SAB      TAB      Ectopic      Multiple      Live Births              Past Medical History:  Diagnosis Date  . Hypoglycemia   . Internal derangement of right knee     Past Surgical History:  Procedure Laterality Date  . egg retrieval    . KNEE ARTHROSCOPY Right 11/07/2012   Procedure: RIGHT KNEE ARTHROSCOPY PLICA RESECTION DEBRIDEMENT AND CHONDROPLASTY ;  Surgeon: Eugenia Mcalpine, MD;  Location: [facility];  Service: Orthopedics;  Laterality: Right;  . TONSILLECTOMY AND ADENOIDECTOMY  AGE 25    Family History  Problem Relation Age of Onset  . Hypertension Mother   . Diabetes Mother   . Hypertension Maternal Grandmother   . Heart failure Maternal Grandmother   . Diabetes Maternal Grandfather     Social History   Tobacco Use  . Smoking status: Never Smoker  . Smokeless tobacco: Never Used  Substance Use Topics  . Alcohol use: Yes    Comment: occassionally  . Drug use: No    Allergies:  Allergies  Allergen Reactions  . Ivp Dye [Iodinated Diagnostic Agents] Hives and Itching  . Other     Steroids- hallucinations, anxiety  . Latex Itching and Rash    SEVERE ITCHING    Medications Prior to Admission  Medication Sig Dispense Refill Last Dose  . aspirin (ASPIRIN EC)  81 MG EC tablet Take 81 mg by mouth daily. Swallow whole.   03/26/2017 at Unknown time  . betamethasone valerate (VALISONE) 0.1 % cream Apply 2 (two) times daily topically. 45 g 0 prn  . estradiol (ESTRACE) 2 MG tablet Take 1 tablet by mouth 2 (two) times daily.   not started at Unknown time  . estradiol (VIVELLE-DOT) 0.1 MG/24HR patch Place 1 patch onto the skin 2 (two) times a week.    not started at Unknown time  . GONAL-F 1050 units SOLR Inject 1,050 Units into the skin once.    03/19/2017  . Leuprolide Acetate (LUPRON Harper) INJECT 10 UNITS SUB CUTANEOUSLY DAILY AS DIRECTED  REFRIGERATE AFTER FIRST USE  1 03/19/2017  . levothyroxine (SYNTHROID, LEVOTHROID) 50 MCG tablet Take 1 tablet (50 mcg total) by mouth daily. 30 tablet 3 03/26/2017 at Unknown time  . metFORMIN (GLUCOPHAGE) 1000 MG tablet Take by mouth. 1 tablet in the morning and 1/2 tablet at night   03/26/2017 at Unknown time  . OVIDREL 250 MCG/0.5ML injection Inject 250 mcg into the skin once.    03/19/2017  . oxyCODONE-acetaminophen (  PERCOCET/ROXICET) 5-325 MG tablet Take 1-2 tablets by mouth every 6 (six) hours as needed for severe pain.   Past Month at Unknown time  . PREGNYL [id number] units injection Inject 10,000 Units as directed once.    03/20/2017  . Probiotic Product (PROBIOTIC PO) Take 1 tablet by mouth daily.   03/26/2017 at Unknown time  . progesterone 50 MG/ML injection Inject 50 mg into the muscle as directed.    not started    Review of Systems Physical Exam   Blood pressure 136/88, pulse (!) 107, temperature 98.6 F (37 C), temperature source Oral, resp. rate 20, height 5\' 8"  (1.727 m), weight 227 lb (103 kg), SpO2 99 %.  Physical Exam  MAU Course  Procedures Results for orders placed or performed during the hospital encounter of 05/06/17 (from the past 24 hour(s))  Urinalysis, Routine w reflex microscopic     Status: Abnormal   Collection Time: 05/06/17  8:06 PM  Result Value Ref Range   Color, Urine YELLOW YELLOW    APPearance CLEAR CLEAR   Specific Gravity, Urine 1.015 1.005 - 1.030   pH 6.0 5.0 - 8.0   Glucose, UA NEGATIVE NEGATIVE mg/dL   Hgb urine dipstick LARGE (A) NEGATIVE   Bilirubin Urine NEGATIVE NEGATIVE   Ketones, ur NEGATIVE NEGATIVE mg/dL   Protein, ur NEGATIVE NEGATIVE mg/dL   Nitrite NEGATIVE NEGATIVE   Leukocytes, UA NEGATIVE NEGATIVE   RBC / HPF TOO NUMEROUS TO COUNT 0 - 5 RBC/hpf   WBC, UA 0-5 0 - 5 WBC/hpf   Bacteria, UA RARE (A) NONE SEEN   Squamous Epithelial / LPF 0-5 (A) NONE SEEN   Mucus PRESENT   Pregnancy, urine POC     Status: Abnormal   Collection Time: 05/06/17  8:40 PM  Result Value Ref Range   Preg Test, Ur POSITIVE (A) NEGATIVE  Comprehensive metabolic panel     Status: Abnormal   Collection Time: 05/06/17  8:59 PM  Result Value Ref Range   Sodium 138 135 - 145 mmol/L   Potassium 4.1 3.5 - 5.1 mmol/L   Chloride 104 101 - 111 mmol/L   CO2 24 22 - 32 mmol/L   Glucose, Bld 120 (H) 65 - 99 mg/dL   BUN 10 6 - 20 mg/dL   Creatinine, Ser 6.040.63 0.44 - 1.00 mg/dL   Calcium 9.3 8.9 - 54.010.3 mg/dL   Total Protein 6.8 6.5 - 8.1 g/dL   Albumin 3.7 3.5 - 5.0 g/dL   AST 18 15 - 41 U/L   ALT 29 14 - 54 U/L   Alkaline Phosphatase 60 38 - 126 U/L   Total Bilirubin 0.6 0.3 - 1.2 mg/dL   GFR calc non Af Amer >60 >60 mL/min   GFR calc Af Amer >60 >60 mL/min   Anion gap 10 5 - 15  ABO/Rh     Status: None (Preliminary result)   Collection Time: 05/06/17  8:59 PM  Result Value Ref Range   ABO/RH(D)      O POS Performed at [facility], [address] [place], [place] [address]    [place] Ob Less Than 14 Weeks With Ob Transvaginal  Result Date: 05/06/2017 CLINICAL DATA:  Vaginal bleeding positive urine pregnancy test EXAM: OBSTETRIC <14 WK US AND TRANSVAGINAL OB US TECHNIQUE: Both transabdominal and transvaginal ultrasound examinations were performed for complete evaluation of the gestation as well as the maternal uterus, adnexal regions, and pelvic cul-de-sac. Transvaginal  technique was performed to assess early pregnancy. COMPARISON:  None. FINDINGS: Intrauterine gestational sac: Visible Yolk sac:  Visible Embryo:  Visible Cardiac Activity: Visible Heart Rate: 106 bpm CRL: 1.7 mm unable to calculate w unable to calculate d [place] EDC: Unable to calculate Subchorionic hemorrhage:  None visualized. Maternal uterus/adnexae: Ovaries are within normal limits. Left lower measures 2.9 x 1.6 by 1.8 cm. Adjacent left para ovarian cyst measuring 1.3 cm. Right ovary measures 2.6 x 1.7 x 1.9 cm. No significant free fluid. IMPRESSION: 1. Single viable intrauterine pregnancy as above. 2. There are no other significant abnormalities visualized. Electronically Signed   By: Jasmine Pang M.D.   On: 05/06/2017 21:50   MDM UA, UPT CMP, HCG, ABO/Rh US OB Transvaginal US OB Comp Less 14 weeks  O Pos blood type Normal LFTs today IUP with fetal pole and HR, no subchorionic hemorrhage  Assessment and Plan   1. Normal intrauterine pregnancy on prenatal ultrasound in first trimester   2. Vaginal bleeding in pregnancy, first trimester   3. [redacted] weeks gestation of pregnancy    -Discharge home in stable condition -Vaginal bleeding and pain precautions discussed -Patient advised to follow-up with Dr. April Manson as scheduled -Patient may return to MAU as needed or if her condition were to change or worsen  Rolm Bookbinder CNM 05/06/2017, 9:52 PM

## 2017-05-07 LAB — ABO/RH: ABO/RH(D): O POS

## 2017-05-10 DIAGNOSIS — Z3200 Encounter for pregnancy test, result unknown: Secondary | ICD-10-CM | POA: Diagnosis not present

## 2017-05-10 MED FILL — LEVOTHYROXINE 50 MCG TABLET: 50 | 28 days supply | Qty: 36 | Fill #4

## 2017-05-24 DIAGNOSIS — O0900 Supervision of pregnancy with history of infertility, unspecified trimester: Secondary | ICD-10-CM | POA: Diagnosis not present

## 2017-05-28 DIAGNOSIS — O09819 Supervision of pregnancy resulting from assisted reproductive technology, unspecified trimester: Secondary | ICD-10-CM | POA: Diagnosis not present

## 2017-05-28 DIAGNOSIS — E039 Hypothyroidism, unspecified: Secondary | ICD-10-CM | POA: Diagnosis not present

## 2017-05-28 DIAGNOSIS — Z3689 Encounter for other specified antenatal screening: Secondary | ICD-10-CM | POA: Diagnosis not present

## 2017-05-28 LAB — OB RESULTS CONSOLE RPR: RPR: NONREACTIVE

## 2017-05-28 LAB — OB RESULTS CONSOLE GC/CHLAMYDIA
CHLAMYDIA, DNA PROBE: NEGATIVE
Gonorrhea: NEGATIVE

## 2017-05-28 LAB — OB RESULTS CONSOLE HIV ANTIBODY (ROUTINE TESTING): HIV: NONREACTIVE

## 2017-05-28 LAB — OB RESULTS CONSOLE ANTIBODY SCREEN: ANTIBODY SCREEN: NEGATIVE

## 2017-05-28 LAB — OB RESULTS CONSOLE ABO/RH: RH TYPE: POSITIVE

## 2017-05-28 LAB — OB RESULTS CONSOLE RUBELLA ANTIBODY, IGM: Rubella: IMMUNE

## 2017-05-28 LAB — OB RESULTS CONSOLE HEPATITIS B SURFACE ANTIGEN: HEP B S AG: NEGATIVE

## 2017-05-31 MED FILL — PROGESTERONE MICRONIZED 200: 200 | 14 days supply | Qty: 14 | Fill #0

## 2017-05-31 MED FILL — AMOXICILLIN 500 MG CAPSULE: 500 | 7 days supply | Qty: 21 | Fill #0

## 2017-06-09 MED FILL — LEVOTHYROXINE 50 MCG TABLET: 50 | 28 days supply | Qty: 36 | Fill #5

## 2017-06-09 MED FILL — metFORMIN HCL 1000 MG TABS: 1000 | 30 days supply | Qty: 60 | Fill #7

## 2017-06-10 DIAGNOSIS — Z3689 Encounter for other specified antenatal screening: Secondary | ICD-10-CM | POA: Diagnosis not present

## 2017-06-10 DIAGNOSIS — O99820 Streptococcus B carrier state complicating pregnancy: Secondary | ICD-10-CM | POA: Diagnosis not present

## 2017-06-10 DIAGNOSIS — Z118 Encounter for screening for other infectious and parasitic diseases: Secondary | ICD-10-CM | POA: Diagnosis not present

## 2017-06-10 DIAGNOSIS — O09819 Supervision of pregnancy resulting from assisted reproductive technology, unspecified trimester: Secondary | ICD-10-CM | POA: Diagnosis not present

## 2017-06-22 MED FILL — NITROFURANTOIN MONO-MCR 100: 100 | 7 days supply | Qty: 14 | Fill #0

## 2017-07-05 MED FILL — LEVOTHYROXINE 50 MCG TABLET: 50 | 40 days supply | Qty: 40 | Fill #1

## 2017-07-06 DIAGNOSIS — O09812 Supervision of pregnancy resulting from assisted reproductive technology, second trimester: Secondary | ICD-10-CM | POA: Diagnosis not present

## 2017-07-06 DIAGNOSIS — O2340 Unspecified infection of urinary tract in pregnancy, unspecified trimester: Secondary | ICD-10-CM | POA: Diagnosis not present

## 2017-07-06 DIAGNOSIS — Z3A14 14 weeks gestation of pregnancy: Secondary | ICD-10-CM | POA: Diagnosis not present

## 2017-07-19 DIAGNOSIS — Z3A16 16 weeks gestation of pregnancy: Secondary | ICD-10-CM | POA: Diagnosis not present

## 2017-07-19 DIAGNOSIS — O09812 Supervision of pregnancy resulting from assisted reproductive technology, second trimester: Secondary | ICD-10-CM | POA: Diagnosis not present

## 2017-07-19 DIAGNOSIS — Z3689 Encounter for other specified antenatal screening: Secondary | ICD-10-CM | POA: Diagnosis not present

## 2017-07-19 DIAGNOSIS — Z361 Encounter for antenatal screening for raised alphafetoprotein level: Secondary | ICD-10-CM | POA: Diagnosis not present

## 2017-08-06 DIAGNOSIS — Z363 Encounter for antenatal screening for malformations: Secondary | ICD-10-CM | POA: Diagnosis not present

## 2017-08-06 MED FILL — LEVOTHYROXINE 50 MCG TABLET: 50 | 30 days supply | Qty: 40 | Fill #2

## 2017-09-02 DIAGNOSIS — O09812 Supervision of pregnancy resulting from assisted reproductive technology, second trimester: Secondary | ICD-10-CM | POA: Insufficient documentation

## 2017-09-06 DIAGNOSIS — Z3A23 23 weeks gestation of pregnancy: Secondary | ICD-10-CM | POA: Diagnosis not present

## 2017-09-06 DIAGNOSIS — O09812 Supervision of pregnancy resulting from assisted reproductive technology, second trimester: Secondary | ICD-10-CM | POA: Diagnosis not present

## 2017-09-06 DIAGNOSIS — O358XX0 Maternal care for other (suspected) fetal abnormality and damage, not applicable or unspecified: Secondary | ICD-10-CM | POA: Diagnosis not present

## 2017-09-06 MED FILL — LEVOTHYROXINE 50 MCG TABLET: 50 | 30 days supply | Qty: 40 | Fill #3

## 2017-10-08 MED FILL — LEVOTHYROXINE 50 MCG TABLET: 50 | 28 days supply | Qty: 36 | Fill #0

## 2017-10-11 DIAGNOSIS — Z3A28 28 weeks gestation of pregnancy: Secondary | ICD-10-CM | POA: Diagnosis not present

## 2017-10-11 DIAGNOSIS — Z3689 Encounter for other specified antenatal screening: Secondary | ICD-10-CM | POA: Diagnosis not present

## 2017-10-11 DIAGNOSIS — O99282 Endocrine, nutritional and metabolic diseases complicating pregnancy, second trimester: Secondary | ICD-10-CM | POA: Diagnosis not present

## 2017-10-25 DIAGNOSIS — Z23 Encounter for immunization: Secondary | ICD-10-CM | POA: Diagnosis not present

## 2017-11-04 MED FILL — LEVOTHYROXINE 50 MCG TABLET: 50 | 28 days supply | Qty: 36 | Fill #1

## 2017-11-08 DIAGNOSIS — Z3A32 32 weeks gestation of pregnancy: Secondary | ICD-10-CM | POA: Diagnosis not present

## 2017-11-08 DIAGNOSIS — O99283 Endocrine, nutritional and metabolic diseases complicating pregnancy, third trimester: Secondary | ICD-10-CM | POA: Diagnosis not present

## 2017-11-23 DIAGNOSIS — Z3A34 34 weeks gestation of pregnancy: Secondary | ICD-10-CM | POA: Diagnosis not present

## 2017-11-23 DIAGNOSIS — O99283 Endocrine, nutritional and metabolic diseases complicating pregnancy, third trimester: Secondary | ICD-10-CM | POA: Diagnosis not present

## 2017-11-28 LAB — OB RESULTS CONSOLE GBS: STREP GROUP B AG: POSITIVE

## 2017-11-30 ENCOUNTER — Other Ambulatory Visit: Payer: Self-pay | Admitting: Obstetrics and Gynecology

## 2017-12-03 MED FILL — LEVOTHYROXINE 50 MCG TABLET: 50 | 28 days supply | Qty: 36 | Fill #2

## 2017-12-08 ENCOUNTER — Encounter (HOSPITAL_COMMUNITY): Payer: Self-pay

## 2017-12-09 ENCOUNTER — Telehealth (HOSPITAL_COMMUNITY): Payer: Self-pay | Admitting: *Deleted

## 2017-12-09 DIAGNOSIS — O99283 Endocrine, nutritional and metabolic diseases complicating pregnancy, third trimester: Secondary | ICD-10-CM | POA: Diagnosis not present

## 2017-12-09 DIAGNOSIS — Z3A36 36 weeks gestation of pregnancy: Secondary | ICD-10-CM | POA: Diagnosis not present

## 2017-12-09 NOTE — Telephone Encounter (Signed)
Preadmission screen  

## 2017-12-10 ENCOUNTER — Encounter (HOSPITAL_COMMUNITY): Payer: Self-pay

## 2017-12-14 DIAGNOSIS — O99283 Endocrine, nutritional and metabolic diseases complicating pregnancy, third trimester: Secondary | ICD-10-CM | POA: Diagnosis not present

## 2017-12-14 DIAGNOSIS — Z3A37 37 weeks gestation of pregnancy: Secondary | ICD-10-CM | POA: Diagnosis not present

## 2017-12-22 ENCOUNTER — Encounter (HOSPITAL_COMMUNITY)
Admission: RE | Admit: 2017-12-22 | Discharge: 2017-12-22 | Disposition: A | Discharge status: Home / Self Care | Payer: 59 | Source: Ambulatory Visit | Attending: Obstetrics and Gynecology | Admitting: Obstetrics and Gynecology

## 2017-12-22 DIAGNOSIS — D62 Acute posthemorrhagic anemia: Secondary | ICD-10-CM | POA: Diagnosis not present

## 2017-12-22 DIAGNOSIS — Q514 Unicornate uterus: Secondary | ICD-10-CM | POA: Diagnosis not present

## 2017-12-22 DIAGNOSIS — O3403 Maternal care for unspecified congenital malformation of uterus, third trimester: Secondary | ICD-10-CM | POA: Diagnosis not present

## 2017-12-22 DIAGNOSIS — O3483 Maternal care for other abnormalities of pelvic organs, third trimester: Secondary | ICD-10-CM | POA: Diagnosis not present

## 2017-12-22 DIAGNOSIS — O321XX0 Maternal care for breech presentation, not applicable or unspecified: Secondary | ICD-10-CM | POA: Diagnosis not present

## 2017-12-22 DIAGNOSIS — O99284 Endocrine, nutritional and metabolic diseases complicating childbirth: Secondary | ICD-10-CM | POA: Diagnosis not present

## 2017-12-22 DIAGNOSIS — E039 Hypothyroidism, unspecified: Secondary | ICD-10-CM | POA: Diagnosis not present

## 2017-12-22 DIAGNOSIS — O99824 Streptococcus B carrier state complicating childbirth: Secondary | ICD-10-CM | POA: Diagnosis not present

## 2017-12-22 DIAGNOSIS — O9081 Anemia of the puerperium: Secondary | ICD-10-CM | POA: Diagnosis not present

## 2017-12-22 HISTORY — DX: Hypothyroidism, unspecified: E03.9

## 2017-12-22 HISTORY — DX: Maternal care for unspecified congenital malformation of uterus, unspecified trimester: Q51.4

## 2017-12-22 HISTORY — DX: Maternal care for unspecified congenital malformation of uterus, unspecified trimester: O34.00

## 2017-12-22 LAB — CBC
HEMATOCRIT: 37.3 % (ref 36.0–46.0)
Hemoglobin: 12.5 g/dL (ref 12.0–15.0)
MCH: 29.1 pg (ref 26.0–34.0)
MCHC: 33.5 g/dL (ref 30.0–36.0)
MCV: 86.7 fL (ref 80.0–100.0)
PLATELETS: 259 10*3/uL (ref 150–400)
RBC: 4.3 MIL/uL (ref 3.87–5.11)
RDW: 14.3 % (ref 11.5–15.5)
WBC: 10.6 10*3/uL — AB (ref 4.0–10.5)

## 2017-12-22 LAB — TYPE AND SCREEN
ABO/RH(D): O POS
ANTIBODY SCREEN: NEGATIVE

## 2017-12-22 NOTE — Patient Instructions (Signed)
Melissa Torres  12/22/2017   Your procedure is scheduled on:  12/25/2017  Enter through the Main Entrance of [facility] at 0530 AM.  Pick up the phone at the desk and dial [phone]  Call this number if you have problems the morning of [phone]  Remember:   Do not eat food:(After Midnight) Desps de medianoche.  Do not drink clear liquids: (After Midnight) Desps de medianoche.  Take these medicines the morning of surgery with A SIP OF WATER: synthroid   Do not wear jewelry, make-up or nail polish.  Do not wear lotions, powders, or perfumes. Do not wear deodorant.  Do not shave 48 hours prior to surgery.  Do not bring valuables to the hospital.  [facility] is not   responsible for any belongings or valuables brought to the hospital.  Contacts, dentures or bridgework may not be worn into surgery.  Leave suitcase in the car. After surgery it may be brought to your room.  For patients admitted to the hospital, checkout time is 11:00 AM the day of              discharge.    N/A   Please read over the following fact sheets that you were given:   Surgical Site Infection Prevention

## 2017-12-23 LAB — RPR: RPR Ser Ql: NONREACTIVE

## 2017-12-25 ENCOUNTER — Encounter (HOSPITAL_COMMUNITY): Payer: Self-pay

## 2017-12-25 ENCOUNTER — Inpatient Hospital Stay (HOSPITAL_COMMUNITY): Payer: 59 | Admitting: Anesthesiology

## 2017-12-25 ENCOUNTER — Encounter (HOSPITAL_COMMUNITY)
Admission: AD | Disposition: A | Discharge status: Home / Self Care | Payer: Self-pay | Source: Home / Self Care | Attending: Obstetrics and Gynecology

## 2017-12-25 ENCOUNTER — Inpatient Hospital Stay (HOSPITAL_COMMUNITY)
Admission: AD | Admit: 2017-12-25 | Discharge: 2017-12-28 | DRG: 787 | Disposition: A | Discharge status: Home / Self Care | Payer: 59 | Attending: Obstetrics and Gynecology | Admitting: Obstetrics and Gynecology

## 2017-12-25 DIAGNOSIS — O99824 Streptococcus B carrier state complicating childbirth: Secondary | ICD-10-CM | POA: Diagnosis present

## 2017-12-25 DIAGNOSIS — Z3A39 39 weeks gestation of pregnancy: Secondary | ICD-10-CM

## 2017-12-25 DIAGNOSIS — E039 Hypothyroidism, unspecified: Secondary | ICD-10-CM | POA: Diagnosis present

## 2017-12-25 DIAGNOSIS — Z3A00 Weeks of gestation of pregnancy not specified: Secondary | ICD-10-CM | POA: Diagnosis not present

## 2017-12-25 DIAGNOSIS — O9081 Anemia of the puerperium: Secondary | ICD-10-CM | POA: Diagnosis not present

## 2017-12-25 DIAGNOSIS — O3483 Maternal care for other abnormalities of pelvic organs, third trimester: Secondary | ICD-10-CM | POA: Diagnosis present

## 2017-12-25 DIAGNOSIS — D62 Acute posthemorrhagic anemia: Secondary | ICD-10-CM | POA: Diagnosis not present

## 2017-12-25 DIAGNOSIS — O99284 Endocrine, nutritional and metabolic diseases complicating childbirth: Secondary | ICD-10-CM | POA: Diagnosis present

## 2017-12-25 DIAGNOSIS — O321XX0 Maternal care for breech presentation, not applicable or unspecified: Principal | ICD-10-CM | POA: Diagnosis present

## 2017-12-25 DIAGNOSIS — Q514 Unicornate uterus: Secondary | ICD-10-CM | POA: Diagnosis not present

## 2017-12-25 DIAGNOSIS — O3403 Maternal care for unspecified congenital malformation of uterus, third trimester: Secondary | ICD-10-CM | POA: Diagnosis not present

## 2017-12-25 DIAGNOSIS — N838 Other noninflammatory disorders of ovary, fallopian tube and broad ligament: Secondary | ICD-10-CM | POA: Diagnosis present

## 2017-12-25 SURGERY — Surgical Case
Anesthesia: Spinal | Site: Abdomen | Wound class: Clean Contaminated

## 2017-12-25 MED ORDER — NALBUPHINE HCL 10 MG/ML IJ SOLN: 5.0000 mg | INTRAMUSCULAR | Status: DC | PRN

## 2017-12-25 MED ORDER — KETOROLAC TROMETHAMINE 30 MG/ML IJ SOLN: 30.0000 mg | Freq: Four times a day (QID) | INTRAMUSCULAR | Status: AC | PRN

## 2017-12-25 MED ORDER — BUPIVACAINE IN DEXTROSE 0.75-8.25 % IT SOLN
INTRATHECAL | Status: DC | PRN
  Administered 2017-12-25: 1.4 mL via INTRATHECAL

## 2017-12-25 MED ORDER — SCOPOLAMINE 1 MG/3DAYS TD PT72: 1.0000 | MEDICATED_PATCH | Freq: Once | TRANSDERMAL | Status: DC

## 2017-12-25 MED ORDER — DEXAMETHASONE SODIUM PHOSPHATE 10 MG/ML IJ SOLN
INTRAMUSCULAR | Status: AC
  Filled 2017-12-25: qty 1

## 2017-12-25 MED ORDER — SENNOSIDES-DOCUSATE SODIUM 8.6-50 MG PO TABS
2.0000 | ORAL_TABLET | ORAL | Status: DC
  Administered 2017-12-26: 2 via ORAL
  Filled 2017-12-25 (×3): qty 2

## 2017-12-25 MED ORDER — KETOROLAC TROMETHAMINE 30 MG/ML IJ SOLN
INTRAMUSCULAR | Status: AC
  Filled 2017-12-25: qty 1

## 2017-12-25 MED ORDER — BUPIVACAINE HCL (PF) 0.25 % IJ SOLN
INTRAMUSCULAR | Status: DC | PRN
  Administered 2017-12-25: 10 mL

## 2017-12-25 MED ORDER — OXYTOCIN 10 UNIT/ML IJ SOLN
INTRAMUSCULAR | Status: AC
  Filled 2017-12-25: qty 4

## 2017-12-25 MED ORDER — SIMETHICONE 80 MG PO CHEW
80.0000 mg | CHEWABLE_TABLET | Freq: Three times a day (TID) | ORAL | Status: DC
  Administered 2017-12-25 – 2017-12-28 (×8): 80 mg via ORAL
  Filled 2017-12-25 (×8): qty 1

## 2017-12-25 MED ORDER — NALOXONE HCL 0.4 MG/ML IJ SOLN: 0.4000 mg | INTRAMUSCULAR | Status: DC | PRN

## 2017-12-25 MED ORDER — LACTATED RINGERS IV SOLN
INTRAVENOUS | Status: DC
  Administered 2017-12-25 (×2): via INTRAVENOUS

## 2017-12-25 MED ORDER — ZOLPIDEM TARTRATE 5 MG PO TABS: 5.0000 mg | ORAL_TABLET | Freq: Every evening | ORAL | Status: DC | PRN

## 2017-12-25 MED ORDER — LEVOTHYROXINE SODIUM 50 MCG PO TABS
50.0000 ug | ORAL_TABLET | ORAL | Status: DC
  Filled 2017-12-25: qty 1

## 2017-12-25 MED ORDER — NALBUPHINE HCL 10 MG/ML IJ SOLN: 5.0000 mg | Freq: Once | INTRAMUSCULAR | Status: DC | PRN

## 2017-12-25 MED ORDER — IBUPROFEN 600 MG PO TABS
600.0000 mg | ORAL_TABLET | Freq: Four times a day (QID) | ORAL | Status: DC
  Administered 2017-12-25 – 2017-12-27 (×6): 600 mg via ORAL
  Filled 2017-12-25 (×7): qty 1

## 2017-12-25 MED ORDER — DIPHENHYDRAMINE HCL 50 MG/ML IJ SOLN: 12.5000 mg | INTRAMUSCULAR | Status: DC | PRN

## 2017-12-25 MED ORDER — DIBUCAINE 1 % RE OINT: 1.0000 "application " | TOPICAL_OINTMENT | RECTAL | Status: DC | PRN

## 2017-12-25 MED ORDER — SIMETHICONE 80 MG PO CHEW
80.0000 mg | CHEWABLE_TABLET | ORAL | Status: DC | PRN
  Administered 2017-12-26 – 2017-12-27 (×2): 80 mg via ORAL
  Filled 2017-12-25 (×2): qty 1

## 2017-12-25 MED ORDER — OXYCODONE HCL 5 MG PO TABS
10.0000 mg | ORAL_TABLET | ORAL | Status: DC | PRN
  Administered 2017-12-27: 10 mg via ORAL
  Filled 2017-12-25: qty 2

## 2017-12-25 MED ORDER — MEPERIDINE HCL 25 MG/ML IJ SOLN: 6.2500 mg | INTRAMUSCULAR | Status: DC | PRN

## 2017-12-25 MED ORDER — KETOROLAC TROMETHAMINE 30 MG/ML IJ SOLN
30.0000 mg | Freq: Four times a day (QID) | INTRAMUSCULAR | Status: AC | PRN
  Filled 2017-12-25: qty 1

## 2017-12-25 MED ORDER — DIPHENHYDRAMINE HCL 25 MG PO CAPS
25.0000 mg | ORAL_CAPSULE | ORAL | Status: DC | PRN
  Filled 2017-12-25: qty 1

## 2017-12-25 MED ORDER — ONDANSETRON HCL 4 MG/2ML IJ SOLN
4.0000 mg | Freq: Three times a day (TID) | INTRAMUSCULAR | Status: DC | PRN
  Administered 2017-12-25: 4 mg via INTRAVENOUS
  Filled 2017-12-25: qty 2

## 2017-12-25 MED ORDER — MORPHINE SULFATE (PF) 0.5 MG/ML IJ SOLN
INTRAMUSCULAR | Status: DC | PRN
  Administered 2017-12-25: .15 mg via EPIDURAL

## 2017-12-25 MED ORDER — LEVOTHYROXINE SODIUM 50 MCG PO TABS
50.0000 ug | ORAL_TABLET | Freq: Every day | ORAL | Status: DC
  Administered 2017-12-27 – 2017-12-28 (×2): 50 ug via ORAL
  Filled 2017-12-25 (×4): qty 1

## 2017-12-25 MED ORDER — PHENYLEPHRINE 8 MG IN D5W 100 ML (0.08MG/ML) PREMIX OPTIME
INJECTION | INTRAVENOUS | Status: AC
  Filled 2017-12-25: qty 100

## 2017-12-25 MED ORDER — OXYCODONE HCL 5 MG PO TABS
5.0000 mg | ORAL_TABLET | ORAL | Status: DC | PRN
  Administered 2017-12-26 – 2017-12-28 (×2): 5 mg via ORAL
  Filled 2017-12-25 (×2): qty 1

## 2017-12-25 MED ORDER — LACTATED RINGERS IV SOLN
INTRAVENOUS | Status: DC
  Administered 2017-12-25: 12:00:00 via INTRAVENOUS

## 2017-12-25 MED ORDER — SIMETHICONE 80 MG PO CHEW
80.0000 mg | CHEWABLE_TABLET | ORAL | Status: DC
  Administered 2017-12-26 – 2017-12-27 (×3): 80 mg via ORAL
  Filled 2017-12-25 (×3): qty 1

## 2017-12-25 MED ORDER — ONDANSETRON HCL 4 MG/2ML IJ SOLN
INTRAMUSCULAR | Status: DC | PRN
  Administered 2017-12-25: 4 mg via INTRAVENOUS

## 2017-12-25 MED ORDER — COCONUT OIL OIL: 1.0000 "application " | TOPICAL_OIL | Status: DC | PRN

## 2017-12-25 MED ORDER — SODIUM CHLORIDE 0.9% FLUSH: 3.0000 mL | INTRAVENOUS | Status: DC | PRN

## 2017-12-25 MED ORDER — BUPIVACAINE HCL (PF) 0.25 % IJ SOLN
INTRAMUSCULAR | Status: AC
  Filled 2017-12-25: qty 30

## 2017-12-25 MED ORDER — PRENATAL MULTIVITAMIN CH
1.0000 | ORAL_TABLET | Freq: Every day | ORAL | Status: DC
  Filled 2017-12-25 (×2): qty 1

## 2017-12-25 MED ORDER — ACETAMINOPHEN 500 MG PO TABS
1000.0000 mg | ORAL_TABLET | Freq: Four times a day (QID) | ORAL | Status: AC
  Administered 2017-12-25 – 2017-12-26 (×3): 1000 mg via ORAL
  Filled 2017-12-25 (×4): qty 2

## 2017-12-25 MED ORDER — PROMETHAZINE HCL 25 MG/ML IJ SOLN: 6.2500 mg | INTRAMUSCULAR | Status: DC | PRN

## 2017-12-25 MED ORDER — LACTATED RINGERS IV SOLN
INTRAVENOUS | Status: DC | PRN
  Administered 2017-12-25: 08:00:00 via INTRAVENOUS

## 2017-12-25 MED ORDER — WITCH HAZEL-GLYCERIN EX PADS: 1.0000 "application " | MEDICATED_PAD | CUTANEOUS | Status: DC | PRN

## 2017-12-25 MED ORDER — CEFAZOLIN SODIUM-DEXTROSE 2-4 GM/100ML-% IV SOLN
2.0000 g | INTRAVENOUS | Status: AC
  Administered 2017-12-25: 2 g via INTRAVENOUS

## 2017-12-25 MED ORDER — MENTHOL 3 MG MT LOZG: 1.0000 | LOZENGE | OROMUCOSAL | Status: DC | PRN

## 2017-12-25 MED ORDER — FENTANYL CITRATE (PF) 100 MCG/2ML IJ SOLN
INTRAMUSCULAR | Status: DC | PRN
  Administered 2017-12-25: 15 ug via INTRATHECAL

## 2017-12-25 MED ORDER — DIPHENHYDRAMINE HCL 25 MG PO CAPS
25.0000 mg | ORAL_CAPSULE | Freq: Four times a day (QID) | ORAL | Status: DC | PRN
  Filled 2017-12-25: qty 1

## 2017-12-25 MED ORDER — DEXAMETHASONE SODIUM PHOSPHATE 10 MG/ML IJ SOLN
INTRAMUSCULAR | Status: DC | PRN
  Administered 2017-12-25: 10 mg via INTRAVENOUS

## 2017-12-25 MED ORDER — FENTANYL CITRATE (PF) 100 MCG/2ML IJ SOLN
INTRAMUSCULAR | Status: AC
  Filled 2017-12-25: qty 2

## 2017-12-25 MED ORDER — MORPHINE SULFATE (PF) 0.5 MG/ML IJ SOLN
INTRAMUSCULAR | Status: AC
  Filled 2017-12-25: qty 10

## 2017-12-25 MED ORDER — ONDANSETRON HCL 4 MG/2ML IJ SOLN
INTRAMUSCULAR | Status: AC
  Filled 2017-12-25: qty 2

## 2017-12-25 MED ORDER — OXYTOCIN 40 UNITS IN LACTATED RINGERS INFUSION - SIMPLE MED
2.5000 [IU]/h | INTRAVENOUS | Status: AC
  Administered 2017-12-25: 2.5 [IU]/h via INTRAVENOUS

## 2017-12-25 MED ORDER — NALOXONE HCL 4 MG/10ML IJ SOLN
1.0000 ug/kg/h | INTRAVENOUS | Status: DC | PRN
  Filled 2017-12-25: qty 5

## 2017-12-25 MED ORDER — LEVOTHYROXINE SODIUM 50 MCG PO TABS
50.0000 ug | ORAL_TABLET | ORAL | Status: DC
  Administered 2017-12-26: 100 ug via ORAL
  Filled 2017-12-25: qty 1

## 2017-12-25 MED ORDER — KETOROLAC TROMETHAMINE 30 MG/ML IJ SOLN
30.0000 mg | Freq: Once | INTRAMUSCULAR | Status: AC | PRN
  Administered 2017-12-25: 30 mg via INTRAVENOUS

## 2017-12-25 MED ORDER — HYDROMORPHONE HCL 1 MG/ML IJ SOLN: 0.2500 mg | INTRAMUSCULAR | Status: DC | PRN

## 2017-12-25 MED ORDER — OXYTOCIN 10 UNIT/ML IJ SOLN
INTRAVENOUS | Status: DC | PRN
  Administered 2017-12-25: 40 [IU] via INTRAVENOUS

## 2017-12-25 MED ORDER — PHENYLEPHRINE 8 MG IN D5W 100 ML (0.08MG/ML) PREMIX OPTIME
INJECTION | INTRAVENOUS | Status: DC | PRN
  Administered 2017-12-25: 40 ug/min via INTRAVENOUS

## 2017-12-25 MED ORDER — LEVOTHYROXINE SODIUM 50 MCG PO TABS: 50.0000 ug | ORAL_TABLET | ORAL | Status: DC

## 2017-12-25 SURGICAL SUPPLY — 44 items
APL SKNCLS STERI-STRIP NONHPOA (GAUZE/BANDAGES/DRESSINGS) ×1
BARRIER ADHS 3X4 INTERCEED (GAUZE/BANDAGES/DRESSINGS) ×2 IMPLANT
BENZOIN TINCTURE PRP APPL 2/3 (GAUZE/BANDAGES/DRESSINGS) ×2 IMPLANT
CHLORAPREP W/TINT 26ML (MISCELLANEOUS) ×2 IMPLANT
CLAMP CORD UMBIL (MISCELLANEOUS) IMPLANT
CLOSURE STERI-STRIP 1/4X4 (GAUZE/BANDAGES/DRESSINGS) ×2 IMPLANT
CLOTH BEACON ORANGE TIMEOUT ST (SAFETY) ×2 IMPLANT
DRAPE C SECTION CLR SCREEN (DRAPES) ×2 IMPLANT
DRSG OPSITE POSTOP 4X10 (GAUZE/BANDAGES/DRESSINGS) ×2 IMPLANT
ELECT REM PT RETURN 9FT ADLT (ELECTROSURGICAL) ×2
ELECTRODE REM PT RTRN 9FT ADLT (ELECTROSURGICAL) ×1 IMPLANT
EXTRACTOR VACUUM M CUP 4 TUBE (SUCTIONS) IMPLANT
GLOVE BIOGEL PI IND STRL 7.0 (GLOVE) ×2 IMPLANT
GLOVE BIOGEL PI INDICATOR 7.0 (GLOVE) ×2
GLOVE ECLIPSE 6.5 STRL STRAW (GLOVE) ×2 IMPLANT
GOWN STRL REUS W/TWL LRG LVL3 (GOWN DISPOSABLE) ×4 IMPLANT
KIT ABG SYR 3ML LUER SLIP (SYRINGE) IMPLANT
NEEDLE HYPO 22GX1.5 SAFETY (NEEDLE) ×2 IMPLANT
NEEDLE HYPO 25X5/8 SAFETYGLIDE (NEEDLE) IMPLANT
NS IRRIG 1000ML POUR BTL (IV SOLUTION) ×2 IMPLANT
PACK C SECTION WH (CUSTOM PROCEDURE TRAY) ×2 IMPLANT
PAD OB MATERNITY 4.3X12.25 (PERSONAL CARE ITEMS) ×2 IMPLANT
RTRCTR C-SECT PINK 25CM LRG (MISCELLANEOUS) IMPLANT
SPONGE LAP 18X18 RF (DISPOSABLE) ×6 IMPLANT
STRIP CLOSURE SKIN 1/2X4 (GAUZE/BANDAGES/DRESSINGS) IMPLANT
SUT CHROMIC GUT AB #0 18 (SUTURE) IMPLANT
SUT MNCRL 0 VIOLET CTX 36 (SUTURE) ×3 IMPLANT
SUT MON AB 2-0 SH 27 (SUTURE)
SUT MON AB 2-0 SH27 (SUTURE) IMPLANT
SUT MON AB 3-0 SH 27 (SUTURE)
SUT MON AB 3-0 SH27 (SUTURE) IMPLANT
SUT MON AB 4-0 PS1 27 (SUTURE) IMPLANT
SUT MONOCRYL 0 CTX 36 (SUTURE) ×3
SUT PLAIN 2 0 (SUTURE)
SUT PLAIN 2 0 XLH (SUTURE) ×2 IMPLANT
SUT PLAIN ABS 2-0 CT1 27XMFL (SUTURE) IMPLANT
SUT VIC AB 0 CT1 36 (SUTURE) ×4 IMPLANT
SUT VIC AB 2-0 CT1 27 (SUTURE) ×1
SUT VIC AB 2-0 CT1 TAPERPNT 27 (SUTURE) ×1 IMPLANT
SUT VIC AB 4-0 KS 27 (SUTURE) ×2 IMPLANT
SUT VIC AB 4-0 PS2 27 (SUTURE) IMPLANT
SYR CONTROL 10ML LL (SYRINGE) ×2 IMPLANT
TOWEL OR 17X24 6PK STRL BLUE (TOWEL DISPOSABLE) ×2 IMPLANT
TRAY FOLEY W/BAG SLVR 14FR LF (SET/KITS/TRAYS/PACK) IMPLANT

## 2017-12-25 NOTE — H&P (Signed)
Melissa Torres is a 32 y.o. female IVF pregnancy presenting @ 39 wk( EDC 01/01/2018 by [facility]) for Primary C/S due to malpresentation( breech). PNC notable for (+) GBS. PMH (+) hypothyroidism, unicornuate. sono 11/19: breech 8lb 11 oz( 99%)  OB History    Gravida  1   Para      Term      Preterm      AB      Living        SAB      TAB      Ectopic      Multiple      Live Births             Past Medical History:  Diagnosis Date  . Hypoglycemia   . Hypothyroidism   . Internal derangement of right knee   . Newborn product of in vitro fertilization (IVF) pregnancy   . Unicornate uterus affecting pregnancy    Past Surgical History:  Procedure Laterality Date  . egg retrieval    . KNEE ARTHROSCOPY Right 11/07/2012   Procedure: RIGHT KNEE ARTHROSCOPY PLICA RESECTION DEBRIDEMENT AND CHONDROPLASTY ;  Surgeon: Eugenia Mcalpine, MD;  Location: [facility];  Service: Orthopedics;  Laterality: Right;  . TONSILLECTOMY AND ADENOIDECTOMY  AGE 64   Family History: family history includes Diabetes in her maternal grandfather and mother; Heart disease in her mother; Heart failure in her maternal grandmother; Hypertension in her maternal grandmother and mother; Prostate cancer in her maternal grandfather. Social History:  reports that she has never smoked. She has never used smokeless tobacco. She reports that she drinks alcohol. She reports that she does not use drugs.     Maternal Diabetes: No Genetic Screening: Declined Maternal Ultrasounds/Referrals: Normal Fetal Ultrasounds or other Referrals:  Fetal echo nl done due to IVF preg Maternal Substance Abuse:  No Significant Maternal Medications:  Meds include: Syntroid Significant Maternal Lab Results:  Lab values include: Group B Strep positive Other Comments:  hypothyroidism, unicornuate uterus, IVF pregnancy  Review of Systems  All other systems reviewed and are negative.  Maternal Medical History:  Fetal  activity: Perceived fetal activity is normal.    Prenatal complications: Hypothyroidism Unicornuate uterus Breech presentation (+) GBS  Prenatal Complications - Diabetes: none.      There were no vitals taken for this visit. Maternal Exam:  Uterine Assessment: Contraction frequency is rare.   Abdomen: Patient reports no abdominal tenderness. Fetal presentation: breech     Fetal Exam Fetal Monitor Review: Mode: ultrasound.       Physical Exam  Constitutional: She is oriented to person, place, and time. She appears well-developed and well-nourished.  HENT:  Head: Atraumatic.  Eyes: EOM are normal.  Neck: Neck supple.  Cardiovascular: Regular rhythm.  GI: Soft.  Neurological: She is alert and oriented to person, place, and time.  Skin: Skin is warm and dry.  Psychiatric: She has a normal mood and affect.   VE closed/long/PP oop  Prenatal labs: ABO, Rh: --/--/O POS (11/27 0911) Antibody: NEG (11/27 0911) Rubella: Immune (05/03 0000) RPR: Non Reactive (11/27 0911)  HBsAg: Negative (05/03 0000)  HIV: Non-reactive (05/03 0000)  GBS: Positive (11/03 0000)   Assessment/Plan: Breech presentation Term gestation @ 39 weeks IVF pregnancy Unicornuate uterus Hypothyroidism on med (+) GBS P) Primary C/S. Risk of surgery reviewed including infection, bleeding, poss need for blood transfusion and its risk( HIV, hepatitis, acute rxn), internal scar tissue, injury to bladder, bowel, ureter,, poss need for C/S  in the future. All ? Answered 2) continue synthroid pp  Cai Anfinson A Arion Morgan 12/25/2017, 6:01 AM

## 2017-12-25 NOTE — Anesthesia Postprocedure Evaluation (Signed)
Anesthesia Post Note  Patient: Melissa Torres  Procedure(s) Performed: Primary CESAREAN SECTION (N/A Abdomen)     Patient location during evaluation: PACU Anesthesia Type: Spinal Level of consciousness: awake Pain management: pain level controlled Vital Signs Assessment: post-procedure vital signs reviewed and stable Respiratory status: spontaneous breathing Cardiovascular status: stable Postop Assessment: no headache, no backache, spinal receding and no apparent nausea or vomiting Anesthetic complications: no    Last Vitals:  Vitals:   12/25/17 0859 12/25/17 0915  BP: 123/71 120/80  Pulse: 87 75  Resp: 18 18  Temp: (!) 36.3 C   SpO2: 100% 98%    Last Pain:  Vitals:   12/25/17 0915  TempSrc:   PainSc: 0-No pain   Pain Goal:                 Caren MacadamJohn F Jacquilyn Seldon Jr

## 2017-12-25 NOTE — Lactation Note (Signed)
This note was copied from a baby's chart. Lactation Consultation Note  Patient Name: Girl Donne HazelChassity Michael ZOXWR'UToday's Date: 12/25/2017 Reason for consult: Follow-up assessment;Term  P1 mother whose infant is now 706 hours old.  Mother requested help with breast feeding.  When I arrived mother had a room full of visitors.  Baby was not showing any feeding cues and was "at the breast" but sleeping. Reviewed breast feeding basics with parents including feeding cues, how to awaken a sleepy baby, how to obtain/maintain a deep latch and hand expression. Mother did a return demonstration of hand expression and was able to express a couple drops of colostrum which was finger fed back to baby. Colostrum container provided for any EBM mother may obtain with hand expression.  Offered to assist with latching and mother accepted.    Mother's breasts are soft and non tender and nipples are short shafted bilaterally. Assisted to latch on the right breast but baby had no interest in sucking at the breast.  She remained sleepy.  Reassured parents that this is typical behavior for a newborn at this age.  Encouraged feeding 8-12 times/24 hours or sooner if baby shows cues.    Breast shells and manual pump with instructions provided.  Encouraged mother to use these tools to help evert nipples.  Mother will call for latch assistance as needed.  Father present and supportive.  RN updated.    Maternal Data Formula Feeding for Exclusion: No Has patient been taught Hand Expression?: Yes Does the patient have breastfeeding experience prior to this delivery?: No  Feeding Feeding Type: Breast Fed  LATCH Score Latch: Too sleepy or reluctant, no latch achieved, no sucking elicited.  Audible Swallowing: None  Type of Nipple: Everted at rest and after stimulation(short shafted)  Comfort (Breast/Nipple): Soft / non-tender  Hold (Positioning): Assistance needed to correctly position infant at breast and maintain  latch.  LATCH Score: 5  Interventions Interventions: Breast feeding basics reviewed;Assisted with latch;Skin to skin;Breast massage;Hand express;Pre-pump if needed;Breast compression;Adjust position;Hand pump;Shells;Expressed milk;Position options;Support pillows  Lactation Tools Discussed/Used WIC Program: No   Consult Status Consult Status: Follow-up Date: 12/26/17 Follow-up type: In-patient    Dora SimsBeth R Darrek Leasure 12/25/2017, 3:32 PM

## 2017-12-25 NOTE — Op Note (Signed)
NAME: Melissa Torres, Melissa Torres. MEDICAL RECORD [id number] ACCOUNT [id number] DATE OF BIRTH:[birth year] FACILITY: [facility] LOCATION: [facility] PHYSICIAN:Christena Sunderlin A. Murdis Flitton, MD  OPERATIVE REPORT  DATE OF PROCEDURE:  12/25/2017  PREOPERATIVE DIAGNOSES:  Breech presentation, unicornuate uterus, term gestation.  POSTOPERATIVE DIAGNOSIS:  Complete breech presentation, unicornuate uterus term gestation.  PROCEDURE:  Primary cesarean section, Kerr hysterotomy.  ANESTHESIA:  Spinal.  SURGEON:  Maxie BetterSheronette Raimundo Corbit, MD  ASSISTANT:  Karmen Stabsracie Sumner, RNFA.  DESCRIPTION OF PROCEDURE:  Under adequate spinal anesthesia, the patient was placed in the supine position with left lateral tilt.  She was sterilely prepped and draped in the usual fashion.  An indwelling Foley catheter was sterilely placed.  Marcaine 0.25% was injected at the planned Pfannenstiel skin incision site.  Pfannenstiel skin incision was then made, carried down to the rectus fascia.  Rectus fascia opened transversely.  Rectus fascia was then bluntly and sharply dissected off the rectus muscle in superior and inferior fashion.  The rectus muscles were split in the midline.  The parietal peritoneum was entered bluntly and extended.  A self-retaining Alexis retractor was then placed.  A well-developed lower uterine segment was noted.  Vesicouterine peritoneum was opened transversely.  Bladder was bluntly displaced off the lower uterine segment inferiorly.  A curvilinear low transverse incision was then made and extended with bandage scissors.  Copious clear amniotic fluid with artificial rupture of membranes occurred.  Subsequent delivery of a live female from a complete breech position using the usual breech maneuver was accomplished.  Delayed cord clamp x1 minute was done. The baby was subsequently passed to the awaiting pediatricians who assigned Apgars of 9 and 10 at 1 and 5 minutes respectively.   Uterine cavity was cleaned of debris.  Uterine  incision had no extension.  It was closed in 2 layers, the first layer was 0 Monocryl running lock stitch.  Second layer was imbricating using 0 Monocryl suture. The placenta was spontaneous, intact not sent to pathology.  Inspection of the uterus confirmed uterine anomaly. The left tube and ovary arise from the left fundal area.  The left tube had a paratubal cyst, which was not removed.  On the right lower aspect of the uterus,  there is a rudimentary horn with normal tubes and ovaries.  The abdomen was irrigated and suctioned of debris.  Interceed was placed on the lower uterine segment in inverted T fashion.  The Alexis retractor was then removed.  The parietal peritoneum was closed with 2-0 Vicryl.  The rectus fascia was closed with 0 Vicryl x2.  The subcutaneous area was irrigated.  Small bleeders cauterized.  Interrupted 2-0 plain sutures placed and the skin approximated using 4-0 Vicryl subcuticular closure.  Benzoin and Steri-Strips placed.  SPECIMEN: placenta not sent.  ESTIMATED BLOOD LOSS:  287 mL.  INTRAOPERATIVE FLUIDS:  1400 ml.  URINE OUTPUT:  100 mL clear yellow urine.  COUNTS:  Sponge and instrument counts x2 was correct.  COMPLICATIONS:  None.  The patient tolerated the procedure well and was transferred to recovery in stable condition.  AN/NUANCE  D:12/25/2017 T:12/25/2017 JOB:[id number]

## 2017-12-25 NOTE — Brief Op Note (Signed)
12/25/2017  9:03 AM  PATIENT:  Melissa Torres  32 y.o. female  PRE-OPERATIVE DIAGNOSIS:  Breech Presentation, Unicornuate Uterus, term   POST-OPERATIVE DIAGNOSIS: Complete  Breech Presentation, Unicornuate Uterus, term  PROCEDURE:  Primary cesarean section, kerr hysterotomy  SURGEON:  Surgeon(s) and Role:    * Kou Gucciardo, Nena JordanSheronette, MD - Primary  PHYSICIAN ASSISTANT:   ASSISTANTS: Karmen Stabsracie Sumner RNFA  ANESTHESIA:   spinal FINDING: LIVE FEMALE complete breech  unicornuate uterus with  Right rudimentary horn lower right side of uterus, left tube with paratubal cyst  And left ov nl arising left fundal ,  Ant placenta Apgar 9/10 EBL:  287 mL   BLOOD ADMINISTERED:none  DRAINS: none   LOCAL MEDICATIONS USED:  MARCAINE     SPECIMEN:  No Specimen  DISPOSITION OF SPECIMEN:  N/A  COUNTS:  YES  TOURNIQUET:  * No tourniquets in log *  DICTATION: .Other Dictation: Dictation Number [id number]  PLAN OF CARE: Admit to inpatient   PATIENT DISPOSITION:  PACU - hemodynamically stable.   Delay start of Pharmacological VTE agent (>24hrs) due to surgical blood loss or risk of bleeding: no

## 2017-12-25 NOTE — Lactation Note (Signed)
This note was copied from a baby's chart. Lactation Consultation Note  Patient Name: Melissa Torres ZOXWR'UToday's Date: 12/25/2017 Reason for consult: Initial assessment;Term Breastfeeding consultation services and support information given and reviewed.  Infant is 4 hours old and has been to the breast once.  Instructed to watch for feeding cues and feed with any cue.  Encouraged to call for assist/concerns prn.  Maternal Data    Feeding Feeding Type: Breast Fed  LATCH Score Latch: Repeated attempts needed to sustain latch, nipple held in mouth throughout feeding, stimulation needed to elicit sucking reflex.  Audible Swallowing: A few with stimulation  Type of Nipple: Everted at rest and after stimulation  Comfort (Breast/Nipple): Soft / non-tender  Hold (Positioning): Assistance needed to correctly position infant at breast and maintain latch.  LATCH Score: 7  Interventions    Lactation Tools Discussed/Used     Consult Status Consult Status: Follow-up Date: 12/26/17 Follow-up type: In-patient    Huston FoleyMOULDEN, Seanne Chirico S 12/25/2017, 12:20 PM

## 2017-12-25 NOTE — Transfer of Care (Signed)
Immediate Anesthesia Transfer of Care Note  Patient: Melissa Torres  Procedure(s) Performed: Primary CESAREAN SECTION (N/A Abdomen)  Patient Location: PACU  Anesthesia Type:Spinal  Level of Consciousness: awake, alert , oriented and patient cooperative  Airway & Oxygen Therapy: Patient Spontanous Breathing  Post-op Assessment: Report given to RN and Post -op Vital signs reviewed and stable  Post vital signs: Reviewed and stable  Last Vitals:  Vitals Value Taken Time  BP 123/71 12/25/2017  8:59 AM  Temp 36.3 C 12/25/2017  8:59 AM  Pulse 80 12/25/2017  9:01 AM  Resp 17 12/25/2017  9:01 AM  SpO2 100 % 12/25/2017  9:01 AM  Vitals shown include unvalidated device data.  Last Pain:  Vitals:   12/25/17 0859  TempSrc: Oral  PainSc: 0-No pain         Complications: No apparent anesthesia complications

## 2017-12-25 NOTE — Anesthesia Preprocedure Evaluation (Signed)
Anesthesia Evaluation  Patient identified by MRN, date of birth, ID band Patient awake    Reviewed: Allergy & Precautions, NPO status , Patient's Chart, lab work & pertinent test results  Airway Mallampati: I       Dental no notable dental hx. (+) Teeth Intact   Pulmonary neg pulmonary ROS,    Pulmonary exam normal breath sounds clear to auscultation       Cardiovascular negative cardio ROS Normal cardiovascular exam Rhythm:Regular Rate:Normal     Neuro/Psych negative neurological ROS  negative psych ROS   GI/Hepatic negative GI ROS, Neg liver ROS,   Endo/Other  Hypothyroidism   Renal/GU negative Renal ROS  negative genitourinary   Musculoskeletal negative musculoskeletal ROS (+)   Abdominal (+) + obese,   Peds  Hematology   Anesthesia Other Findings   Reproductive/Obstetrics                             Anesthesia Physical Anesthesia Plan  ASA: II  Anesthesia Plan: Spinal   Post-op Pain Management:    Induction:   PONV Risk Score and Plan: 3 and Ondansetron, Dexamethasone, Scopolamine patch - Pre-op and Treatment may vary due to age or medical condition  Airway Management Planned: Natural Airway and Nasal Cannula  Additional Equipment:   Intra-op Plan:   Post-operative Plan:   Informed Consent: I have reviewed the patients History and Physical, chart, labs and discussed the procedure including the risks, benefits and alternatives for the proposed anesthesia with the patient or authorized representative who has indicated his/her understanding and acceptance.     Plan Discussed with: CRNA and Surgeon  Anesthesia Plan Comments:         Anesthesia Quick Evaluation

## 2017-12-25 NOTE — Anesthesia Procedure Notes (Signed)
Spinal  Patient location during procedure: OR Start time: 12/25/2017 7:40 AM End time: 12/25/2017 7:43 AM Staffing Anesthesiologist: Leilani AbleHatchett, Derric Dealmeida, MD Performed: anesthesiologist  Preanesthetic Checklist Completed: patient identified, site marked, surgical consent, pre-op evaluation, timeout performed, IV checked, risks and benefits discussed and monitors and equipment checked Spinal Block Patient position: sitting Prep: site prepped and draped and DuraPrep Patient monitoring: continuous pulse ox and blood pressure Approach: midline Location: L3-4 Injection technique: single-shot Needle Needle type: Pencan  Needle gauge: 24 G Needle length: 10 cm Needle insertion depth: 7 cm Assessment Sensory level: T4

## 2017-12-26 ENCOUNTER — Encounter (HOSPITAL_COMMUNITY): Payer: Self-pay | Admitting: Obstetrics and Gynecology

## 2017-12-26 LAB — BIRTH TISSUE RECOVERY COLLECTION (PLACENTA DONATION)

## 2017-12-26 LAB — CBC
HCT: 30.7 % — ABNORMAL LOW (ref 36.0–46.0)
Hemoglobin: 10.4 g/dL — ABNORMAL LOW (ref 12.0–15.0)
MCH: 30 pg (ref 26.0–34.0)
MCHC: 33.9 g/dL (ref 30.0–36.0)
MCV: 88.5 fL (ref 80.0–100.0)
Platelets: 214 10*3/uL (ref 150–400)
RBC: 3.47 MIL/uL — ABNORMAL LOW (ref 3.87–5.11)
RDW: 14 % (ref 11.5–15.5)
WBC: 13.4 10*3/uL — ABNORMAL HIGH (ref 4.0–10.5)
nRBC: 0 % (ref 0.0–0.2)

## 2017-12-26 NOTE — Anesthesia Postprocedure Evaluation (Signed)
Anesthesia Post Note  Patient: Melissa Torres  Procedure(s) Performed: Primary CESAREAN SECTION (N/A Abdomen)     Patient location during evaluation: Mother Baby Anesthesia Type: Spinal Level of consciousness: oriented and awake and alert Pain management: pain level controlled Vital Signs Assessment: post-procedure vital signs reviewed and stable Respiratory status: spontaneous breathing, respiratory function stable and patient connected to nasal cannula oxygen Cardiovascular status: blood pressure returned to baseline and stable Postop Assessment: no headache, no backache, no apparent nausea or vomiting and patient able to bend at knees Anesthetic complications: no    Last Vitals:  Vitals:   12/26/17 0235 12/26/17 0645  BP: (!) 151/80 (!) 144/77  Pulse: 71 77  Resp: 18 18  Temp: 36.7 C 36.7 C  SpO2:      Last Pain:  Vitals:   12/26/17 0645  TempSrc: Oral  PainSc: 0-No pain   Pain Goal:                 Rica RecordsICKELTON,Kirsten Mckone

## 2017-12-26 NOTE — Lactation Note (Signed)
This note was copied from a baby's chart. Lactation Consultation Note  Patient Name: Girl Donne HazelChassity Niesen WJXBJ'YToday's Date: 12/26/2017 Reason for consult: Follow-up assessment;Term;Primapara Mom called out for feeding assist because nipple is pinched when baby comes off.  Baby fussy and placed skin to skin in football hold.  Mom shown how to compress breast for deeper latch.  Baby latched easily and deep.  Mom comfortable with feeding.  Observed feeding for 10 minutes.  Nipple round when baby came off.  Discussed cluster feeding.  Instructed to feed with cues.  Encouraged to call for assist/concerns prn.  Maternal Data    Feeding Feeding Type: Breast Fed  LATCH Score Latch: Grasps breast easily, tongue down, lips flanged, rhythmical sucking.  Audible Swallowing: A few with stimulation  Type of Nipple: Everted at rest and after stimulation  Comfort (Breast/Nipple): Soft / non-tender  Hold (Positioning): Assistance needed to correctly position infant at breast and maintain latch.  LATCH Score: 8  Interventions Interventions: Assisted with latch;Breast compression;Skin to skin;Adjust position;Breast massage;Support pillows;Hand express  Lactation Tools Discussed/Used     Consult Status Consult Status: Follow-up Date: 12/27/17 Follow-up type: In-patient    Huston FoleyMOULDEN, Kinlie Janice S 12/26/2017, 2:43 PM

## 2017-12-26 NOTE — Progress Notes (Signed)
Patient ID: Melissa DonningChassity L Torres, female   DOB: 03/09/1985, 32 y.o.   MRN: [id number] Subjective: POD# 1 Live born female  Birth Weight: 8 lb 9.7 oz (3905 g) APGAR: 9, 10  Newborn Delivery   Birth date/time:  12/25/2017 08:07:00 Delivery type:  C-Section, Low Transverse Trial of labor:  No C-section categorization:  Primary    Baby name: [name] Delivering provider: COUSINS, Melissa Torres   Feeding: breast  Pain control at delivery: Spinal   Reports feeling well.  Patient reports tolerating PO.   Breast symptoms: working on latch. Pain controlled with PO meds Denies HA/SOB/C/P/N/V/dizziness. Flatus present. She reports vaginal bleeding as normal, without clots.  She is ambulating, urinating without difficulty.     Objective:   VS:    Vitals:   12/25/17 1755 12/25/17 2100 12/26/17 0235 12/26/17 0645  BP: 129/79  (!) 151/80 (!) 144/77  Pulse: 70 65 71 77  Resp: 18 18 18 18   Temp: 98.2 F (36.8 C) 98.2 F (36.8 C) 98 F (36.7 C) 98 F (36.7 C)  TempSrc: Oral Oral Oral Oral  SpO2: 98% 97%    Weight:      Height:          Intake/Output Summary (Last 24 hours) at 12/26/2017 0902 Last data filed at 12/26/2017 0440 Gross per 24 hour  Intake 1856.25 ml  Output 2275 ml  Net -418.75 ml        Recent Labs    12/26/17 0555  WBC 13.4*  HGB 10.4*  HCT 30.7*  PLT 214     Blood type: --/--/O POS (11/27 0911)  Rubella: Immune (05/03 0000)  Vaccines: TDaP UTD         Flu    UTD   Physical Exam:  General: alert, cooperative and no distress CV: Regular rate and rhythm Resp: clear Abdomen: soft, nontender, normal bowel sounds Incision: clean, dry and intact Uterine Fundus: firm, below umbilicus, nontender Lochia: minimal Ext: extremities normal, atraumatic, no cyanosis or edema      Assessment/Plan: 32 y.o.   POD# 1. G1P1001                  Principal Problem:   Breech presentation Active Problems:   Postpartum care following cesarean delivery   Doing  well, stable.               Advance diet as tolerated Encourage rest when baby rests Breastfeeding support Encourage to ambulate Routine post-op care  Neta Mendsaniela C Dior Torres, CNM, MSN 12/26/2017, 9:02 AM

## 2017-12-26 NOTE — Addendum Note (Signed)
Addendum  created 12/26/17 0819 by Rica Recordsickelton, Nadirah Socorro, CRNA   Sign clinical note

## 2017-12-26 NOTE — Lactation Note (Signed)
This note was copied from a baby's chart. Lactation Consultation Note  Patient Name: Melissa Torres ZOXWR'UToday's Date: 12/26/2017 Reason for consult: Follow-up assessment;Term P1, 16 hour female infant. Mom is a [employer]ner, [employer] Employee with [employer] insurance  mom was given Medela PLS Advance Backpack.  Mom was breastfeeding prior LC entered room. Infant was burped and LC notice mom's nipples was pinched. Dad changed a stool (meconium). Per parents, infant had 5 stools and 4 voids since delivery.  Mom latched infant on right breast using football hold infant had wide mouth gape, nose touching breast and LC observed audible swallowing. Infant BF for 15 minutes. Per mom, latch feels better and she can tell a difference.  Mom demonstrated hand expression and infant was given 6 ml of EBM by spoon.  Mom is feeling more confident with breastfeeding infant. Mom knows to call Nurse or LC if she has any further questions, concerns or needs assistance with latch.  Maternal Data    Feeding Feeding Type: Breast Fed  LATCH Score Latch: Grasps breast easily, tongue down, lips flanged, rhythmical sucking.  Audible Swallowing: Spontaneous and intermittent  Type of Nipple: Everted at rest and after stimulation  Comfort (Breast/Nipple): Soft / non-tender  Hold (Positioning): Assistance needed to correctly position infant at breast and maintain latch.  LATCH Score: 9  Interventions Interventions: Assisted with latch;Adjust position;Support pillows;Hand express;Breast compression;Position options;Expressed milk  Lactation Tools Discussed/Used     Consult Status Consult Status: Follow-up Date: 12/27/17 Follow-up type: In-patient    Danelle EarthlyRobin Jezreel Justiniano 12/26/2017, 12:09 AM

## 2017-12-27 ENCOUNTER — Other Ambulatory Visit: Payer: Self-pay

## 2017-12-27 MED ORDER — IBUPROFEN 800 MG PO TABS
800.0000 mg | ORAL_TABLET | Freq: Four times a day (QID) | ORAL | Status: DC
  Administered 2017-12-27 – 2017-12-28 (×4): 800 mg via ORAL
  Filled 2017-12-27 (×4): qty 1

## 2017-12-27 MED ORDER — ACETAMINOPHEN 325 MG PO TABS
650.0000 mg | ORAL_TABLET | ORAL | Status: DC | PRN
  Administered 2017-12-27: 650 mg via ORAL
  Filled 2017-12-27 (×2): qty 2

## 2017-12-27 NOTE — Progress Notes (Signed)
POSTOPERATIVE DAY # 2 S/P breech  S:         Reports feeling better today             Tolerating po intake / no nausea / no vomiting / + flatus / no BM             Bleeding is light             Pain controlled with Motrin - wants to avoid narcostic meds as much as possible                                                             requests Tylenol and Motrin alternating every 3-4 hours             Up ad lib / ambulatory/ voiding QS  Newborn Breast   O:  VS: BP 112/62 (BP Location: Right Arm)   Pulse 71   Temp 98.8 F (37.1 C) (Oral)   Resp 18   Ht 5\' 9"  (1.753 m)   Wt 111.6 kg   SpO2 95%   Breastfeeding? Unknown   BMI 36.34 kg/m   LABS:              Recent Labs    12/26/17 0555  WBC 13.4*  HGB 10.4*  PLT 214               Bloodtype: --/--/O POS (11/27 0911)  Rubella: Immune (05/03 0000)                tdap current 2019 / declines flu vaccine - previous allergic reaction                                        I&O: Intake/Output      12/01 0701 - 12/02 0700 12/02 0701 - 12/03 0700   I.V. (mL/kg)     Other     Total Intake(mL/kg)     Urine (mL/kg/hr) 600 (0.2)    Blood     Total Output 600    Net -600                    Physical Exam:             Alert and Oriented X3  Lungs: Clear and unlabored  Heart: regular rate and rhythm / no mumurs  Abdomen: soft, non-tender, non-distended              Fundus: firm, non-tender, Ueven             Dressing intact              Incision:  approximated with suture / no erythema / no ecchymosis / no drainage  Perineum: intact  Lochia: light  Extremities: noedema, no calf pain or tenderness, negative Homans  A:        POD # 2 S/P CS-breech  P:        Routine postoperative care              Adjust Motrin dose to 800mg  and add PRN Tylenol for pain   Melissa Torres CNM, MSN, [facility] 12/27/2017, 7:26 AM

## 2017-12-27 NOTE — Progress Notes (Signed)
Showed pt how to set up her DEBP that was provided by the hospital.  Also, encouraged mom to put baby to the breast at least every three hours, and then supplement with either expressed breast milk or formula with the curve tip syringe.  Encouraged her to pump after feedings and not to skip meals with baby especially because of the 8 percent weight loss.

## 2017-12-27 NOTE — Lactation Note (Signed)
This note was copied from a baby's chart. Lactation Consultation Note: Mom reports baby has not fed since 7 am and is sleepy now at this feeding, Diaper changed and baby did latch after several attempts, Few swallows noted. Nipple round when baby came off the breast. Parents to supplement with curved tip syringe after nursing. No questions at present. Has her own Medela pump with her and will pump after feedings to supplement and promote milk supply. Encouraged to feed whenever she sees feeding cues and at least q 3 hours. To call for assist prn  Patient Name: Melissa Torres ZOXWR'UToday's Date: 12/27/2017 Reason for consult: Follow-up assessment;Primapara   Maternal Data Formula Feeding for Exclusion: No Has patient been taught Hand Expression?: Yes Does the patient have breastfeeding experience prior to this delivery?: No  Feeding Feeding Type: Breast Fed  LATCH Score Latch: Repeated attempts needed to sustain latch, nipple held in mouth throughout feeding, stimulation needed to elicit sucking reflex.  Audible Swallowing: A few with stimulation  Type of Nipple: Everted at rest and after stimulation  Comfort (Breast/Nipple): Soft / non-tender  Hold (Positioning): Assistance needed to correctly position infant at breast and maintain latch.  LATCH Score: 7  Interventions Interventions: Breast feeding basics reviewed;Support pillows;Position options;Breast massage;Breast compression;Hand express  Lactation Tools Discussed/Used WIC Program: No   Consult Status Consult Status: Follow-up Date: 12/28/17 Follow-up type: In-patient    Pamelia HoitWeeks, Ramaj Frangos D 12/27/2017, 11:04 AM

## 2017-12-28 MED ORDER — IBUPROFEN 800 MG PO TABS: 800.0000 mg | ORAL_TABLET | Freq: Four times a day (QID) | ORAL | 0 refills | Status: DC

## 2017-12-28 MED ORDER — OXYCODONE HCL 5 MG PO TABS: 5.0000 mg | ORAL_TABLET | ORAL | 0 refills | Status: DC | PRN

## 2017-12-28 MED FILL — IBUPROFEN 800 MG TAB: 800 | 7 days supply | Qty: 30 | Fill #0

## 2017-12-28 MED FILL — oxyCODONE HCL 5 MG TABS: 5 | 5 days supply | Qty: 30 | Fill #0

## 2017-12-28 NOTE — Lactation Note (Signed)
This note was copied from a baby's chart. Lactation Consultation Note  Patient Name: Melissa Torres Draheim ONGEX'BToday's Date: 12/28/2017 Reason for consult: Follow-up assessment;Term  P1 mother whose infant is now 774 hours old.    Mother had no questions/concerns when I arrived.  Baby was resting in bassinet and parents are awaiting discharge.  Mother stated that, at times, baby does not want to latch at the breast.  At other times she latches well.  Mother has been giving a little supplement using an artificial nipple.  We discussed using other methods for supplementing but mother feels like she would prefer to use the current nipple she has.  She will always breast feed first and will continue to pump using her DEBP.  She has a Medela pump for home use.    Her breasts are soft and non tender with slight tenderness to nipples.  However, nipples are rounded after feeding.  Encouraged mother to continue using EBM and coconut oil as needed after feedings.  Engorgement prevention/treatment discussed.    Reminded mother of our OP lactation services and the breast feeding support groups.  Father present.  RN notified that parents are ready for discharge.   Maternal Data Formula Feeding for Exclusion: No Has patient been taught Hand Expression?: Yes Does the patient have breastfeeding experience prior to this delivery?: No  Feeding Feeding Type: Breast Milk with Formula added  LATCH Score                   Interventions    Lactation Tools Discussed/Used WIC Program: No   Consult Status Consult Status: Complete Date: 12/28/17 Follow-up type: Call as needed    Laasia Arcos R Iness Pangilinan 12/28/2017, 10:20 AM

## 2017-12-28 NOTE — Progress Notes (Signed)
POSTOPERATIVE DAY # 3 S/P Primary LTCS for breech, baby girl "[name]"   S:         Reports feeling better today; ready to be discharged home              Tolerating po intake / no nausea / no vomiting / + flatus / + BM  Denies dizziness, SOB, or CP             Bleeding is light             Pain controlled with Motrin and Oxycodone             Up ad lib / ambulatory/ voiding QS  Newborn breast feeding with formula supplementing - feels like milk has come in    O:  VS: BP 121/75 (BP Location: Left Arm)   Pulse 77   Temp 98.5 F (36.9 C) (Oral)   Resp 18   Ht 5\' 9"  (1.753 m)   Wt 111.6 kg   SpO2 99%   Breastfeeding? Unknown   BMI 36.34 kg/m    LABS:               Recent Labs    12/26/17 0555  WBC 13.4*  HGB 10.4*  PLT 214               Bloodtype: --/--/O POS (11/27 0911)  Rubella: Immune (05/03 0000)                                             I&O: Intake/Output      12/02 0701 - 12/03 0700 12/03 0701 - 12/04 0700   Urine (mL/kg/hr)     Total Output     Net          Tdap UTD Declined Flu             Physical Exam:             Alert and Oriented X3  Lungs: Clear and unlabored  Heart: regular rate and rhythm / no murmurs  Abdomen: soft, non-tender, non-distended, active bowel sounds in all quadrants             Fundus: firm, non-tender, U-3             Dressing: honeycomb with steri-strips intact; old shadowing marked on dressing              Incision:  approximated with sutures / no erythema / no ecchymosis / no drainage  Perineum: intact  Lochia: small, no clots   Extremities: trace BLE edema, no calf pain or tenderness,   A:        POD # 3 S/P Primary LTCS            ABL Anemia   Hypothyroidism on Synthroid  P:        Routine postoperative care   Advised to take OTC iron supplement every other day and continue stool softener PRN              Discharge home today  [facility] discharge book given, warning s/s and instructions reviewed   F/u in 6 weeks for PP  visit   Melissa JewsMeredith Bernd Crom, MSN, CNM [facility]

## 2017-12-28 NOTE — Discharge Summary (Addendum)
Obstetric Discharge Summary   Patient Name: Melissa Torres DOB: 11/29/1985 MRN: [id number]  Date of Admission: 12/25/2017 Date of Discharge: 12/28/2017 Date of Delivery: 12/25/17 Gestational Age at Delivery: 2752w0d  Primary OB: [facility] - Dr. 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 Breech presentation 12/25/2017  . Postpartum care following cesarean delivery (11/30) 12/25/2017  . Hypothyroid 05/27/2016  . SHOULDER PAIN 02/25/2009    Date of Delivery: 12/25/17 Delivered By: Dr. Cherly Hensenousins Delivery Type: primary cesarean section, low transverse incision   Newborn Data: Live born female  Birth Weight: 8 lb 9.7 oz (3905 g) APGAR: 9, 10  Newborn Delivery   Birth date/time:  12/25/2017 08:07:00 Delivery type:  C-Section, Low Transverse Trial of labor:  No C-section categorization:  Primary        Hospital/Postpartum Course  (Cesarean Section):  Pt. Admitted for primary LTCS for breech presentation and unicornuate uterus. See notes for details. Patient had an uncomplicated postpartum course.  By time of discharge on POD#3, her pain was controlled on oral pain medications; she had appropriate lochia and was ambulating, voiding without difficulty, tolerating regular diet and passing flatus.   She was deemed stable for discharge to home.     Labs: CBC Latest Ref Rng & Units 12/26/2017 12/22/2017 03/26/2017  WBC 4.0 - 10.5 K/uL 13.4(H) 10.6(H) 11.4(H)  Hemoglobin 12.0 - 15.0 g/dL 10.4(L) 12.5 13.6  Hematocrit 36.0 - 46.0 %  30.7(L) 37.3 40.2  Platelets 150 - 400 K/uL 214 259 296   O POS  Physical exam:  BP 121/75 (BP Location: Left Arm)   Pulse 77   Temp 98.5 F (36.9 C) (Oral)   Resp 18   Ht 5\' 9"  (1.753 m)   Wt 111.6 kg   SpO2 99%   Breastfeeding? Unknown   BMI 36.34 kg/m  General: alert and no distress Pulm: normal respiratory effort Lochia: appropriate Abdomen: soft, NT Uterine Fundus: firm, below umbilicus Perineum: healing well, no significant erythema, no significant edema Incision: c/d/i, healing well, no significant drainage, no dehiscence, no significant erythema Extremities: No evidence of DVT seen on physical exam. Trace lower extremity edema.   Disposition: stable, discharge to home Baby Feeding: breast milk and formula Baby Disposition: home with mom  Contraception: not discussed; IVF pregnancy  Rh Immune globulin given: N/A Rubella vaccine given: N/A Tdap vaccine given in AP or PP setting: UTD Flu vaccine given in AP or PP setting: declined; prior allergic reaction   Plan:  Raneen L Roseanne RenoStewart was discharged to home in good condition. [facility]  Discharge Instructions: Per After Visit Summary. Refer to After Visit Summary and [facility]  Activity: Advance as tolerated. Pelvic rest for 6 weeks.   Diet: Regular, Heart Healthy Discharge Medications: Allergies as of 12/28/2017      Reactions   Ivp Dye [iodinated Diagnostic Agents] Hives, Itching   Influenza Vaccines Other (See Comments)   Painful pustules in mouth and throat   Other    Steroids- hallucinations, anxiety CHG wipes/solutions--flares up eczema/itching   Latex Itching,  Rash   SEVERE ITCHING      Medication List    TAKE these medications   betamethasone valerate 0.1 % cream Commonly known as:  VALISONE Apply 2 (two) times daily topically. What changed:    how much to take  when to take this  reasons to take this   ibuprofen 800 MG  tablet Commonly known as:  ADVIL,MOTRIN Take 1 tablet (800 mg total) by mouth every 6 (six) hours.   levothyroxine 50 MCG tablet Commonly known as:  SYNTHROID, LEVOTHROID Take 1 tablet (50 mcg total) by mouth daily. What changed:    how much to take  when to take this  additional instructions   oxyCODONE 5 MG immediate release tablet Commonly known as:  Oxy IR/ROXICODONE Take 1 tablet (5 mg total) by mouth every 4 (four) hours as needed (pain scale 4-7).   prenatal multivitamin Tabs tablet Take 1 tablet by mouth daily.      Outpatient follow up:  Follow-up Information    Maxie Better, MD. Schedule an appointment as soon as possible for a visit in 6 week(s).   Specialty:  Obstetrics and Gynecology Why:  Postpartum visit  Contact information: [address] [place] [address] [phone]           Signed:  Carlean Jews, MSN, CNM [facility]

## 2018-01-03 MED FILL — LEVOTHYROXINE 50 MCG TABLET: 50 | 30 days supply | Qty: 36 | Fill #0

## 2018-02-02 MED FILL — LEVOTHYROXINE 50 MCG TABLET: 50 | 30 days supply | Qty: 36 | Fill #1

## 2018-02-09 DIAGNOSIS — Z130 Encounter for screening for diseases of the blood and blood-forming organs and certain disorders involving the immune mechanism: Secondary | ICD-10-CM | POA: Diagnosis not present

## 2018-02-09 DIAGNOSIS — O99285 Endocrine, nutritional and metabolic diseases complicating the puerperium: Secondary | ICD-10-CM | POA: Diagnosis not present

## 2018-02-09 MED FILL — NORETHINDRONE 0.35 MG TAB: 0.35 | 84 days supply | Qty: 84 | Fill #0

## 2018-02-25 ENCOUNTER — Encounter: Payer: Self-pay | Admitting: Family Medicine

## 2018-02-25 ENCOUNTER — Ambulatory Visit (INDEPENDENT_AMBULATORY_CARE_PROVIDER_SITE_OTHER): Payer: 59 | Admitting: Family Medicine

## 2018-02-25 ENCOUNTER — Encounter: Payer: Self-pay | Admitting: Internal Medicine

## 2018-02-25 VITALS — BP 112/70 | HR 93 | Ht 67.0 in | Wt 228.4 lb

## 2018-02-25 DIAGNOSIS — Z0000 Encounter for general adult medical examination without abnormal findings: Secondary | ICD-10-CM

## 2018-02-25 DIAGNOSIS — Z8639 Personal history of other endocrine, nutritional and metabolic disease: Secondary | ICD-10-CM

## 2018-02-25 DIAGNOSIS — E039 Hypothyroidism, unspecified: Secondary | ICD-10-CM | POA: Diagnosis not present

## 2018-02-25 DIAGNOSIS — E282 Polycystic ovarian syndrome: Secondary | ICD-10-CM

## 2018-02-25 DIAGNOSIS — Z79899 Other long term (current) drug therapy: Secondary | ICD-10-CM

## 2018-02-25 DIAGNOSIS — Z13220 Encounter for screening for lipoid disorders: Secondary | ICD-10-CM | POA: Diagnosis not present

## 2018-02-25 DIAGNOSIS — Z833 Family history of diabetes mellitus: Secondary | ICD-10-CM | POA: Diagnosis not present

## 2018-02-25 LAB — POCT URINALYSIS DIP (PROADVANTAGE DEVICE)
Bilirubin, UA: NEGATIVE
Blood, UA: NEGATIVE
GLUCOSE UA: NEGATIVE mg/dL
Ketones, POC UA: NEGATIVE mg/dL
Leukocytes, UA: NEGATIVE
Nitrite, UA: NEGATIVE
Protein Ur, POC: NEGATIVE mg/dL
Specific Gravity, Urine: 1.02
Urobilinogen, Ur: NEGATIVE
pH, UA: 6 (ref 5.0–8.0)

## 2018-02-25 NOTE — Progress Notes (Signed)
Subjective:    Patient ID: Melissa Torres, female    DOB: 22-Apr-1985, 33 y.o.   MRN: [id number]  HPI Chief Complaint  Patient presents with  . new pt physical    new physical  and thyroid    She is new to the practice and here for a complete physical exam. Previous medical care: Dr. Pearson Grippe  Last CPE: 2015   Other providers: Dr. Audie Box and Maryelizabeth Rowan gynecologists. Cousins - OB/GYN Dr. April Manson- fertility specialist   History of PCOS and infertility issues managed by her OB/GYN  She was put on levothyroxine while trying to pregnant.  Recent TSH was low so her dose was decreased to 50 mcg daily on 02/12/2018.  Before she was taking 50 mcg 5 days per week and then 100 mcg on the other 2 days.   Reports hands are sweaty since reducing her dose   Delivered her daughter on 12/25/2017. Baby was breech and she had a C-section.   She is on progesterone birth control for now.    She was lactating but this week her mild dried up.   History of hypoglycemia for the past 10 years.  Eating small frequent meals with protein.   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  Denies smoking, drinking alcohol, drug use  Diet: fairly healthy  Excerise: 3 times per week.   Immunizations: flu shot- allergy   Health maintenance:  Mammogram: N/A Colonoscopy: N/A Last Dental Exam: annually  Last Eye Exam: years   Wears seatbelt always, uses sunscreen, smoke detectors in home and functioning, does not text while driving and feels safe in home environment.   Reviewed allergies, medications, past medical, surgical, family, and social history.     Review of Systems Review of Systems Constitutional: -fever, -chills, -sweats, -unexpected weight change,-fatigue ENT: -runny nose, -ear pain, -sore throat Cardiology:  -chest pain, -palpitations, -edema Respiratory: -cough, -shortness of breath, -wheezing Gastroenterology: -abdominal pain, -nausea,  -vomiting, -diarrhea, -constipation  Hematology: -bleeding or bruising problems Musculoskeletal: -arthralgias, -myalgias, -joint swelling, -back pain Ophthalmology: -vision changes Urology: -dysuria, -difficulty urinating, -hematuria, -urinary frequency, -urgency Neurology: -headache, -weakness, -tingling, -numbness       Objective:   Physical Exam BP 112/70   Pulse 93   Ht 5\' 7"  (1.702 m)   Wt 228 lb 6.4 oz (103.6 kg)   Breastfeeding No   BMI 35.77 kg/m   General Appearance:    Alert, cooperative, no distress, appears stated age  Head:    Normocephalic, without obvious abnormality, atraumatic  Eyes:    PERRL, conjunctiva/corneas clear, EOM's intact, fundi    benign  Ears:    Normal TM's and external ear canals  Nose:   Nares normal, mucosa normal, no drainage or sinus   tenderness  Throat:   Lips, mucosa, and tongue normal; teeth and gums normal  Neck:   Supple, no lymphadenopathy;  thyroid:  no   enlargement/tenderness/nodules; no carotid   bruit or JVD  Back:    Spine nontender, no curvature, ROM normal, no CVA     tenderness  Lungs:     Clear to auscultation bilaterally without wheezes, rales or     ronchi; respirations unlabored  Chest Wall:    No tenderness or deformity   Heart:    Regular rate and rhythm, S1 and S2 normal, no murmur, rub   or gallop  Breast Exam:   OB/GYN  Abdomen:     Soft, non-tender,  nondistended, normoactive bowel sounds,    no masses, no hepatosplenomegaly  Genitalia:   OB/GYN  Rectal:    Not performed due to age<40 and no related complaints  Extremities:   No clubbing, cyanosis or edema  Pulses:   2+ and symmetric all extremities  Skin:   Skin color, texture, turgor normal, no rashes or lesions  Lymph nodes:   Cervical, supraclavicular, and axillary nodes normal  Neurologic:   CNII-XII intact, normal strength, sensation and gait; reflexes 2+ and symmetric throughout          Psych:   Normal mood, affect, hygiene and grooming.     Urinalysis  dipstick: negative       Assessment & Plan:  Routine general medical examination at a health care facility - Plan: POCT Urinalysis DIP (Proadvantage Device), CBC with Differential/Platelet, Comprehensive metabolic panel, TSH, T4, free, Lipid panel  PCOS (polycystic ovarian syndrome)  History of hypoglycemia - Plan: TSH, T4, free  Hypothyroidism, unspecified type  Family history of diabetes mellitus in mother - Plan: Hemoglobin A1c  Screening for lipid disorders - Plan: Lipid panel  Medication management - Plan: TSH, T4, free  She is a pleasant 33 year old female who is new to the practice and here to establish care as well as had a CPE.  She is not fasting and requests to return for labs next week. Delivered her daughter by cesarean section on 12/25/2017 and is doing well.  She is followed by her OB/GYN. History of PCOS and infertility. Recent TSH was low and her levothyroxine dose was reduced.  This will need to be rechecked to determine appropriate dose. Discussed that history of PCOS and hypoglycemia along with family history of diabetes I will place her at increased risk of developing diabetes down the road.  Discussed eating a low-carb diet and limiting sugar intake.  Recommend getting at least 150 minutes of physical activity per week Up-to-date on health maintenance and immunizations. 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 Follow-up pending labs.

## 2018-02-25 NOTE — Patient Instructions (Signed)
Try to limit carbohydrates to 45-60 for your meals and 15 for snacks.  Limit sweets and sugary drinks.  Get at least 150 minutes of vigorous physical activity per week.   We will forward your lab results. Schedule a lab visit to get these next week. Come in fasting (nothing to eat or drink except water for at least 6 hours).     Preventive Care 18-39 Years, Female Preventive care refers to lifestyle choices and visits with your health care provider that can promote health and wellness. What does preventive care include?   A yearly physical exam. This is also called an annual well check.  Dental exams once or twice a year.  Routine eye exams. Ask your health care provider how often you should have your eyes checked.  Personal lifestyle choices, including: ? Daily care of your teeth and gums. ? Regular physical activity. ? Eating a healthy diet. ? Avoiding tobacco and drug use. ? Limiting alcohol use. ? Practicing safe sex. ? Taking vitamin and mineral supplements as recommended by your health care provider. What happens during an annual well check? The services and screenings done by your health care provider during your annual well check will depend on your age, overall health, lifestyle risk factors, and family history of disease. Counseling Your health care provider may ask you questions about your:  Alcohol use.  Tobacco use.  Drug use.  Emotional well-being.  Home and relationship well-being.  Sexual activity.  Eating habits.  Work and work Statistician.  Method of birth control.  Menstrual cycle.  Pregnancy history. Screening You may have the following tests or measurements:  Height, weight, and BMI.  Diabetes screening. This is done by checking your blood sugar (glucose) after you have not eaten for a while (fasting).  Blood pressure.  Lipid and cholesterol levels. These may be checked every 5 years starting at age 61.  Skin check.  Hepatitis C  blood test.  Hepatitis B blood test.  Sexually transmitted disease (STD) testing.  BRCA-related cancer screening. This may be done if you have a family history of breast, ovarian, tubal, or peritoneal cancers.  Pelvic exam and Pap test. This may be done every 3 years starting at age 60. Starting at age 24, this may be done every 5 years if you have a Pap test in combination with an HPV test. Discuss your test results, treatment options, and if necessary, the need for more tests with your health care provider. Vaccines Your health care provider may recommend certain vaccines, such as:  Influenza vaccine. This is recommended every year.  Tetanus, diphtheria, and acellular pertussis (Tdap, Td) vaccine. You may need a Td booster every 10 years.  Varicella vaccine. You may need this if you have not been vaccinated.  HPV vaccine. If you are 39 or younger, you may need three doses over 6 months.  Measles, mumps, and rubella (MMR) vaccine. You may need at least one dose of MMR. You may also need a second dose.  Pneumococcal 13-valent conjugate (PCV13) vaccine. You may need this if you have certain conditions and were not previously vaccinated.  Pneumococcal polysaccharide (PPSV23) vaccine. You may need one or two doses if you smoke cigarettes or if you have certain conditions.  Meningococcal vaccine. One dose is recommended if you are age 50-21 years and a first-year college student living in a residence hall, or if you have one of several medical conditions. You may also need additional booster doses.  Hepatitis A vaccine.  You may need this if you have certain conditions or if you travel or work in places where you may be exposed to hepatitis A.  Hepatitis B vaccine. You may need this if you have certain conditions or if you travel or work in places where you may be exposed to hepatitis B.  Haemophilus influenzae type b (Hib) vaccine. You may need this if you have certain risk  factors. Talk to your health care provider about which screenings and vaccines you need and how often you need them. This information is not intended to replace advice given to you by your health care provider. Make sure you discuss any questions you have with your health care provider. Document Released: 03/10/2001 Document Revised: 08/25/2016 Document Reviewed: 11/13/2014 Elsevier Interactive Patient Education  2019 [employer].

## 2018-03-03 MED FILL — LEVOTHYROXINE 50 MCG TABLET: 50 | 30 days supply | Qty: 36 | Fill #2

## 2018-03-04 ENCOUNTER — Other Ambulatory Visit: Payer: Self-pay

## 2018-03-07 ENCOUNTER — Encounter: Payer: Self-pay | Admitting: Family Medicine

## 2018-03-07 MED FILL — NORETHIN-ESTRAD-FERR 1-0.02: 1-20 | 84 days supply | Qty: 84 | Fill #0

## 2018-03-11 ENCOUNTER — Other Ambulatory Visit: Payer: Self-pay

## 2018-03-11 DIAGNOSIS — Z8639 Personal history of other endocrine, nutritional and metabolic disease: Secondary | ICD-10-CM

## 2018-03-11 DIAGNOSIS — Z0000 Encounter for general adult medical examination without abnormal findings: Secondary | ICD-10-CM

## 2018-03-11 DIAGNOSIS — Z833 Family history of diabetes mellitus: Secondary | ICD-10-CM

## 2018-03-11 DIAGNOSIS — Z13220 Encounter for screening for lipoid disorders: Secondary | ICD-10-CM

## 2018-03-11 DIAGNOSIS — Z79899 Other long term (current) drug therapy: Secondary | ICD-10-CM

## 2018-03-12 LAB — COMPREHENSIVE METABOLIC PANEL
A/G RATIO: 2 (ref 1.2–2.2)
ALT: 12 IU/L (ref 0–32)
AST: 6 IU/L (ref 0–40)
Albumin: 4.6 g/dL (ref 3.8–4.8)
Alkaline Phosphatase: 75 IU/L (ref 39–117)
BUN/Creatinine Ratio: 17 (ref 9–23)
BUN: 11 mg/dL (ref 6–20)
Bilirubin Total: 0.4 mg/dL (ref 0.0–1.2)
CO2: 23 mmol/L (ref 20–29)
Calcium: 9.9 mg/dL (ref 8.7–10.2)
Chloride: 102 mmol/L (ref 96–106)
Creatinine, Ser: 0.64 mg/dL (ref 0.57–1.00)
GFR calc non Af Amer: 118 mL/min/{1.73_m2} (ref >59)
GFR, EST AFRICAN AMERICAN: 137 mL/min/{1.73_m2} (ref >59)
Globulin, Total: 2.3 g/dL (ref 1.5–4.5)
Glucose: 82 mg/dL (ref 65–99)
POTASSIUM: 4.8 mmol/L (ref 3.5–5.2)
Sodium: 141 mmol/L (ref 134–144)
Total Protein: 6.9 g/dL (ref 6.0–8.5)

## 2018-03-12 LAB — CBC WITH DIFFERENTIAL/PLATELET
Basophils Absolute: 0 10*3/uL (ref 0.0–0.2)
Basos: 0 %
EOS (ABSOLUTE): 0.6 10*3/uL — ABNORMAL HIGH (ref 0.0–0.4)
Eos: 9 %
Hematocrit: 37.5 % (ref 34.0–46.6)
Hemoglobin: 12.3 g/dL (ref 11.1–15.9)
Immature Grans (Abs): 0 10*3/uL (ref 0.0–0.1)
Immature Granulocytes: 0 %
LYMPHS: 34 %
Lymphocytes Absolute: 2.4 10*3/uL (ref 0.7–3.1)
MCH: 27.1 pg (ref 26.6–33.0)
MCHC: 32.8 g/dL (ref 31.5–35.7)
MCV: 83 fL (ref 79–97)
Monocytes Absolute: 0.5 10*3/uL (ref 0.1–0.9)
Monocytes: 7 %
NEUTROS ABS: 3.6 10*3/uL (ref 1.4–7.0)
Neutrophils: 50 %
Platelets: 279 10*3/uL (ref 150–450)
RBC: 4.54 x10E6/uL (ref 3.77–5.28)
RDW: 14 % (ref 11.7–15.4)
WBC: 7.2 10*3/uL (ref 3.4–10.8)

## 2018-03-12 LAB — HEMOGLOBIN A1C
Est. average glucose Bld gHb Est-mCnc: 100 mg/dL
Hgb A1c MFr Bld: 5.1 % (ref 4.8–5.6)

## 2018-03-12 LAB — LIPID PANEL
Chol/HDL Ratio: 4.9 ratio — ABNORMAL HIGH (ref 0.0–4.4)
Cholesterol, Total: 214 mg/dL — ABNORMAL HIGH (ref 100–199)
HDL: 44 mg/dL (ref >39)
LDL CALC: 144 mg/dL — AB (ref 0–99)
Triglycerides: 131 mg/dL (ref 0–149)
VLDL Cholesterol Cal: 26 mg/dL (ref 5–40)

## 2018-03-12 LAB — TSH: TSH: 0.72 u[IU]/mL (ref 0.450–4.500)

## 2018-03-12 LAB — T4, FREE: Free T4: 1.35 ng/dL (ref 0.82–1.77)

## 2018-04-07 MED FILL — LEVOTHYROXINE 50 MCG TABLET: 50 | 30 days supply | Qty: 36 | Fill #3

## 2018-04-18 ENCOUNTER — Telehealth: Payer: Self-pay | Admitting: Internal Medicine

## 2018-04-18 DIAGNOSIS — E039 Hypothyroidism, unspecified: Secondary | ICD-10-CM

## 2018-04-18 MED ORDER — LEVOTHYROXINE SODIUM 50 MCG PO TABS: 50.0000 ug | ORAL_TABLET | Freq: Every day | ORAL | 0 refills | Status: DC

## 2018-04-18 MED FILL — NORETHINDRONE 0.35 MG TAB: 0.35 | 84 days supply | Qty: 84 | Fill #1

## 2018-04-18 NOTE — Telephone Encounter (Signed)
Pt requested refill for levothyroxine to [facility] outpatient. She will need to be re-evaluated in may once coronavirus is over as pt does not want to come in to have lab work done

## 2018-05-03 ENCOUNTER — Other Ambulatory Visit: Payer: Self-pay | Admitting: Women's Health

## 2018-05-03 ENCOUNTER — Telehealth: Payer: Self-pay | Admitting: Internal Medicine

## 2018-05-03 MED ORDER — BETAMETHASONE VALERATE 0.1 % EX CREA: 1.0000 "application " | TOPICAL_CREAM | Freq: Two times a day (BID) | CUTANEOUS | 1 refills | Status: DC | PRN

## 2018-05-03 MED FILL — LEVOTHYROXINE 50 MCG TABLET: 50 | 90 days supply | Qty: 90 | Fill #0

## 2018-05-03 MED FILL — BETAMETHASONE VALERATE 0.1: 0.1 | 30 days supply | Qty: 45 | Fill #0

## 2018-05-03 NOTE — Telephone Encounter (Signed)
Pt needs a refill on betamethason valerate cream 0.1% for her eczema. Send to [facility] outpatient. She has had ezcema for years and has to use cream to help calm the flare down

## 2018-05-03 NOTE — Telephone Encounter (Signed)
Ok to refill. Thanks 

## 2018-05-03 NOTE — Telephone Encounter (Signed)
Sent med to pharmacy  

## 2018-05-09 MED FILL — BLISOVI FE 1/20 1-20 MG-MCG: 1-20 | 84 days supply | Qty: 84 | Fill #1

## 2018-06-09 ENCOUNTER — Other Ambulatory Visit: Payer: Self-pay | Admitting: Family Medicine

## 2018-06-09 ENCOUNTER — Telehealth: Payer: Self-pay | Admitting: Internal Medicine

## 2018-06-09 DIAGNOSIS — E039 Hypothyroidism, unspecified: Secondary | ICD-10-CM

## 2018-06-09 NOTE — Telephone Encounter (Signed)
Waiting on pt to get back in touch with me of an appt time

## 2018-06-09 NOTE — Telephone Encounter (Signed)
done

## 2018-06-09 NOTE — Telephone Encounter (Signed)
Pt has advised that you wanted her to come back in for her thyroid to be rechecked. Please put in future orders as last visit you ordered multiple thyroid panels

## 2018-06-10 ENCOUNTER — Other Ambulatory Visit: Payer: Self-pay

## 2018-06-10 ENCOUNTER — Other Ambulatory Visit: Payer: 59

## 2018-06-10 DIAGNOSIS — E039 Hypothyroidism, unspecified: Secondary | ICD-10-CM

## 2018-06-10 NOTE — Telephone Encounter (Signed)
Pt coming in today for labs at 10:30

## 2018-06-11 LAB — TSH: TSH: 2.98 u[IU]/mL (ref 0.450–4.500)

## 2018-07-25 ENCOUNTER — Encounter: Payer: Self-pay | Admitting: Family Medicine

## 2018-08-10 MED FILL — BLISOVI FE 1/20 1-20 MG-MCG: 1-20 | 84 days supply | Qty: 84 | Fill #0

## 2018-08-15 ENCOUNTER — Other Ambulatory Visit: Payer: Self-pay | Admitting: Family Medicine

## 2018-08-15 DIAGNOSIS — E039 Hypothyroidism, unspecified: Secondary | ICD-10-CM

## 2018-08-15 MED FILL — LEVOTHYROXINE 50 MCG TABLET: 50 | 90 days supply | Qty: 90 | Fill #0

## 2018-09-18 IMAGING — CT CT ANGIO CHEST
2 of 6 series · 19 of 36 positions shown · IV contrast (ISOVUE)
Comparison: Chest x-ray 03/26/2017

CLINICAL DATA: Shortness of breath and right upper quadrant pain

EXAM:
CT ANGIOGRAPHY CHEST WITH CONTRAST
TECHNIQUE: Multidetector CT imaging of the chest was performed using the
standard protocol during bolus administration of intravenous
contrast. Multiplanar CT image reconstructions and MIPs were
obtained to evaluate the vascular anatomy.
CONTRAST:  100mL BUNEZA-DJH IOPAMIDOL (BUNEZA-DJH) INJECTION 76%

[Series 5: thins · axial · 0.68mm/px · z∈[-423,-211]mm · 18 of 236 slices shown]
[im 12/236  lung]
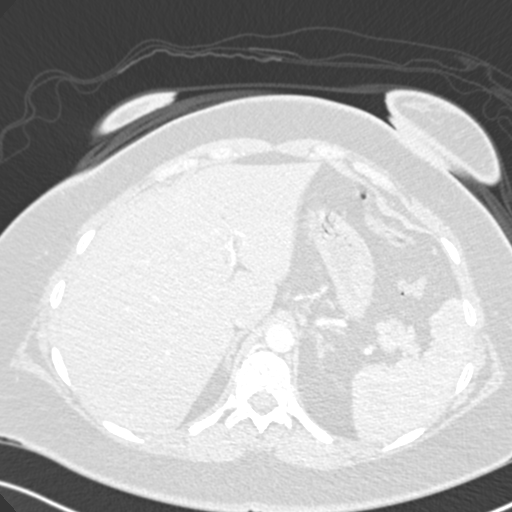
[im 24/236  mediastinal]
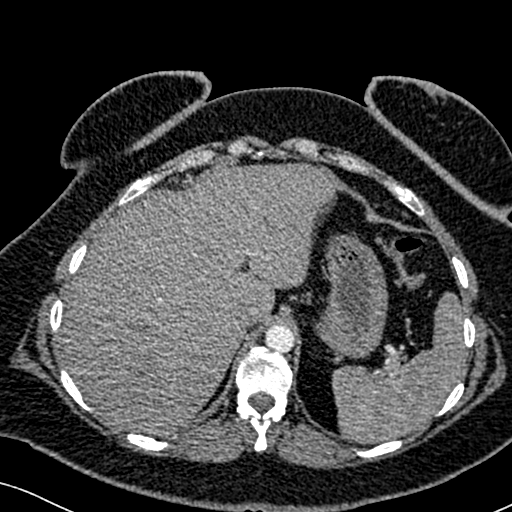
[im 36/236  lung]
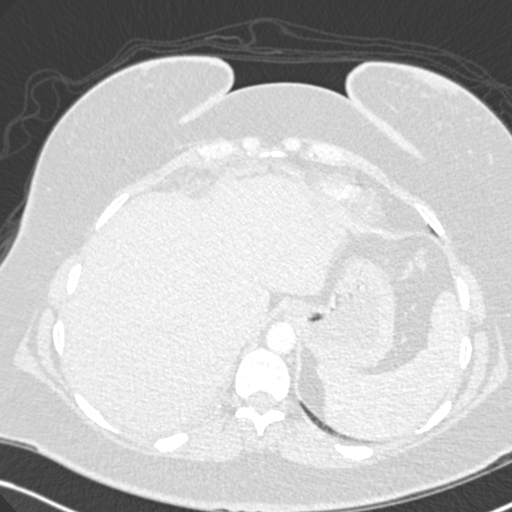
[im 48/236  mediastinal]
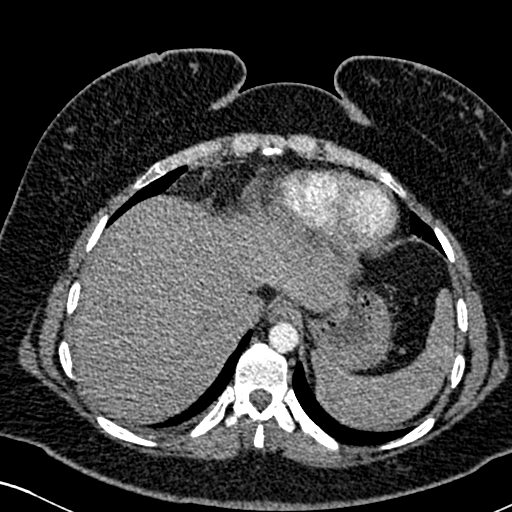
[im 59/236  lung]
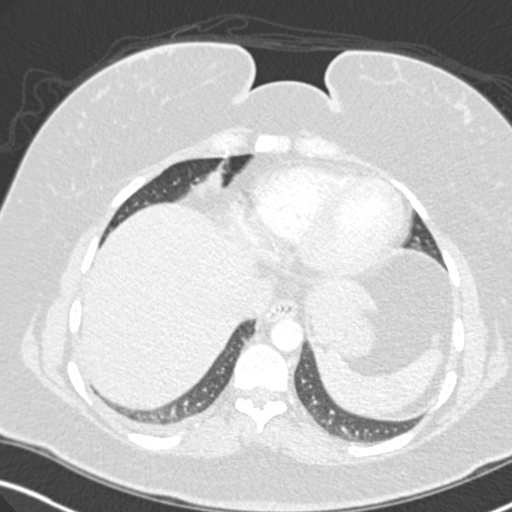
[im 71/236  mediastinal]
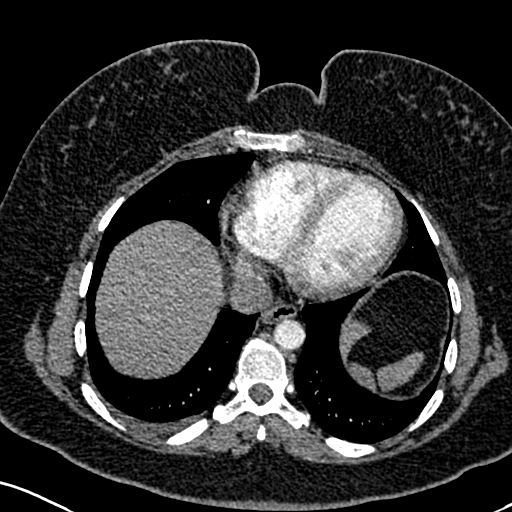
[im 83/236  lung]
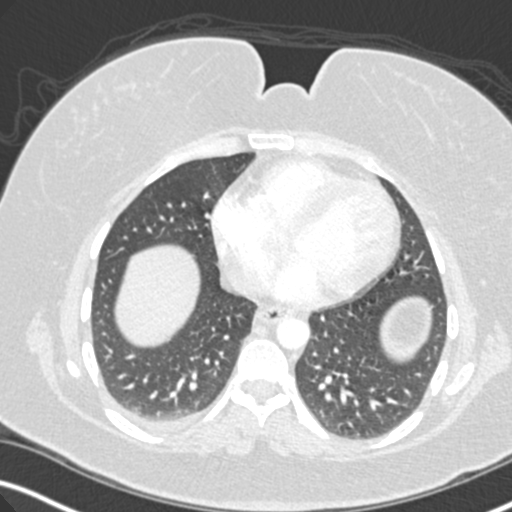
[im 95/236  mediastinal]
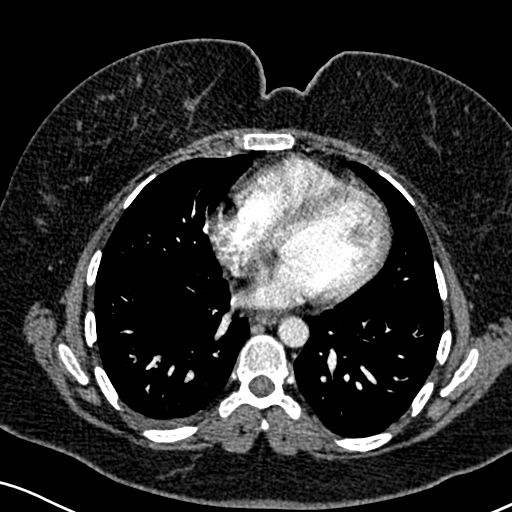
[im 106/236  lung]
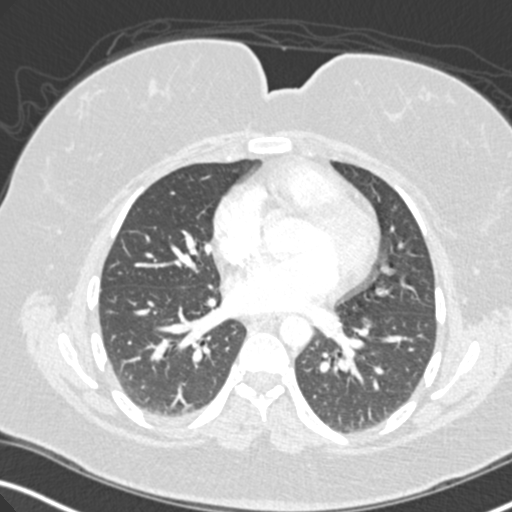
[im 130/236  mediastinal]
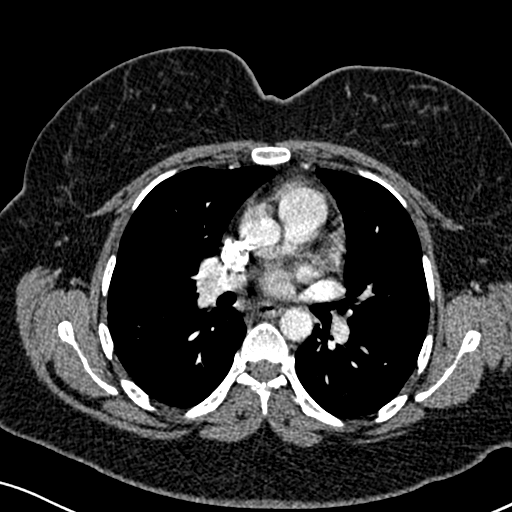
[im 142/236  lung]
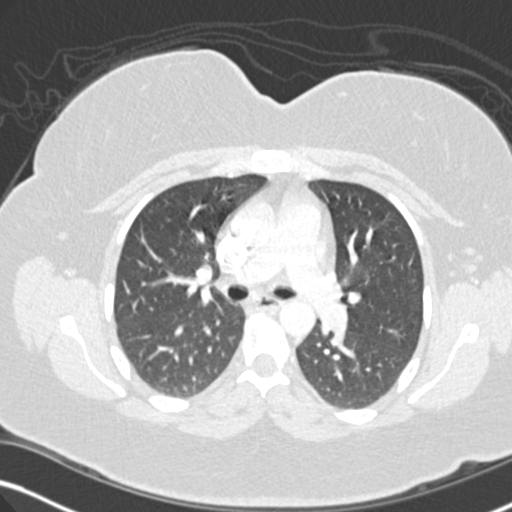
[im 153/236  mediastinal]
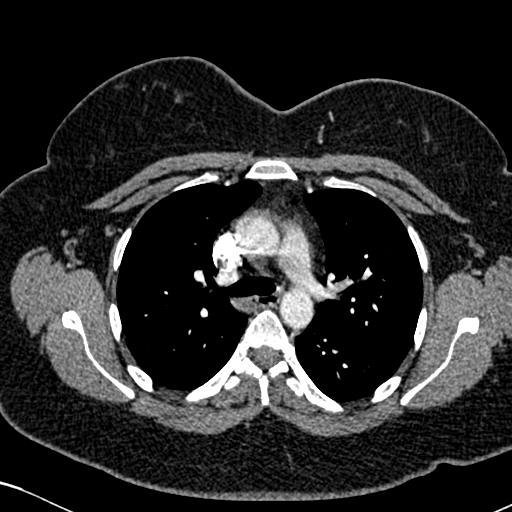
[im 165/236  lung]
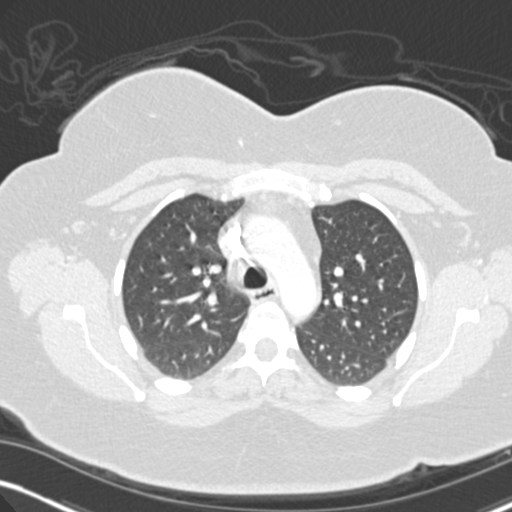
[im 177/236  mediastinal]
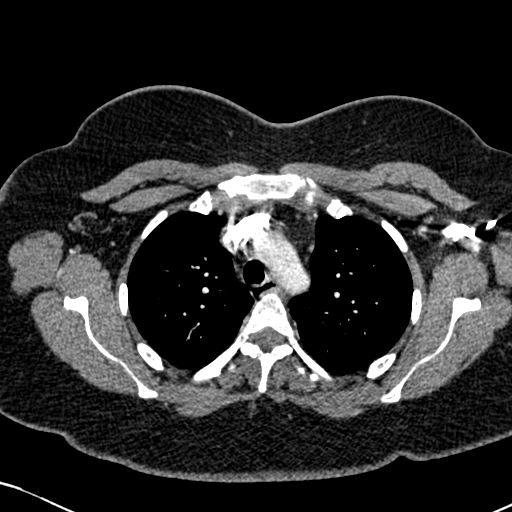
[im 189/236  lung]
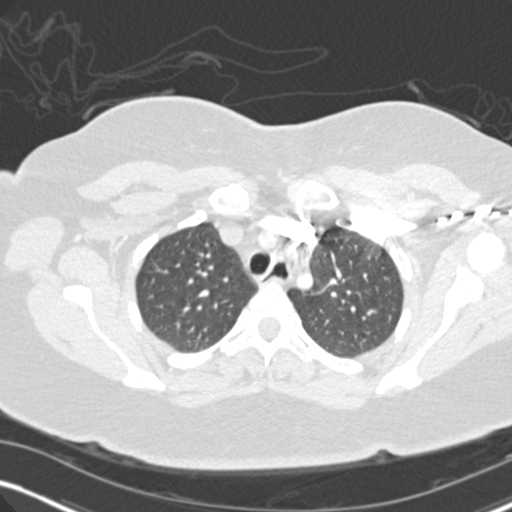
[im 200/236  mediastinal]
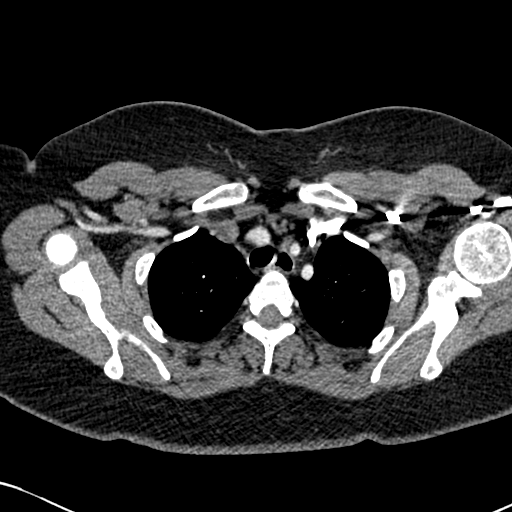
[im 212/236  lung]
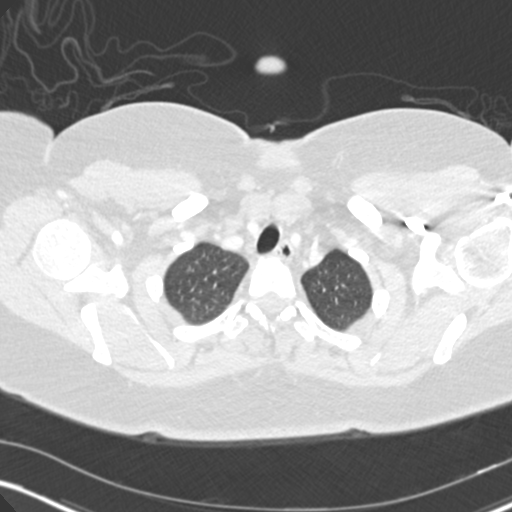
[im 224/236  mediastinal]
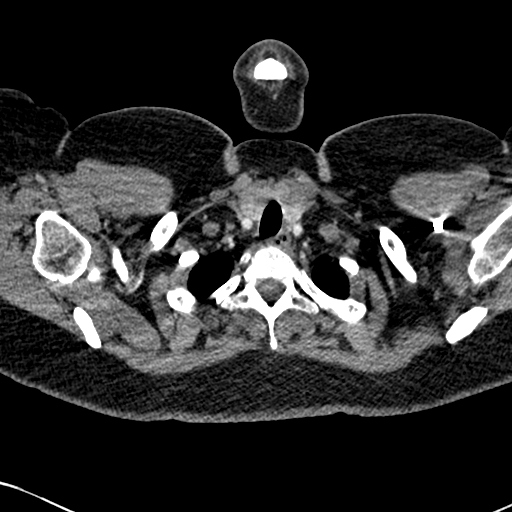

[Series 6: coronal mpr · coronal · 0.49mm/px · 1 of 151 slices shown]
[im 76/151  mediastinal]
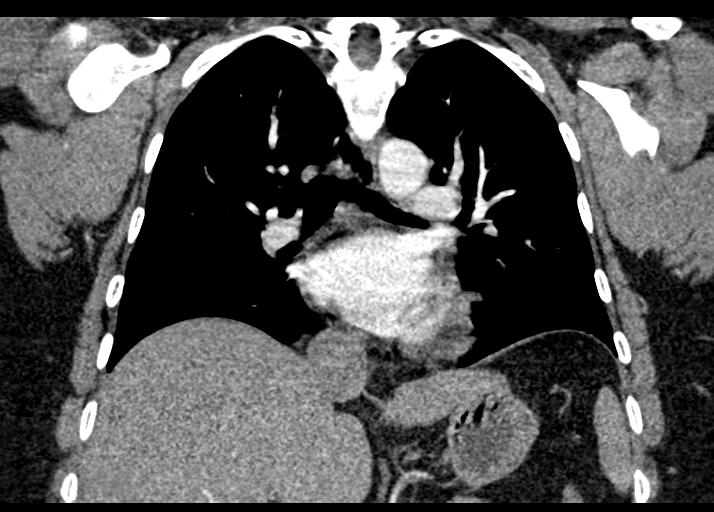

[19 of 36 positions shown; findings below may reference images not displayed]

FINDINGS: Cardiovascular: Poor opacification of the pulmonary arterial system
which limits evaluation for emboli. No definite acute central
embolus is seen. Nonaneurysmal aorta. Normal heart size. No
pericardial effusion

Mediastinum/Nodes: No enlarged mediastinal, hilar, or axillary lymph
nodes. Thyroid gland, trachea, and esophagus demonstrate no
significant findings.

Lungs/Pleura: No consolidation or pneumothorax. Small right pleural
effusion

Upper Abdomen: No acute abnormality.

Musculoskeletal: No chest wall abnormality. No acute or significant
osseous findings.

Review of the MIP images confirms the above findings.
IMPRESSION: 1. Limited evaluation for emboli due to poor contrast opacification
of the pulmonary arterial system. No gross acute central embolus is
seen.
2. Small right-sided pleural effusion.

## 2018-10-17 ENCOUNTER — Encounter: Payer: Self-pay | Admitting: Gynecology

## 2018-10-17 MED FILL — METHOCARBAMOL 500 MG TABS: 500 | 10 days supply | Qty: 30 | Fill #0

## 2018-11-14 MED FILL — LEVOTHYROXINE 50 MCG TABLET: 50 | 90 days supply | Qty: 90 | Fill #1

## 2018-12-12 ENCOUNTER — Encounter: Payer: Self-pay | Admitting: Medical

## 2018-12-12 ENCOUNTER — Other Ambulatory Visit: Payer: Self-pay

## 2018-12-12 ENCOUNTER — Ambulatory Visit: Payer: 59 | Admitting: Medical

## 2018-12-12 VITALS — BP 125/82 | HR 82 | Temp 98.8°F | Ht 68.0 in | Wt 215.0 lb

## 2018-12-12 DIAGNOSIS — J029 Acute pharyngitis, unspecified: Secondary | ICD-10-CM | POA: Diagnosis not present

## 2018-12-12 DIAGNOSIS — J3489 Other specified disorders of nose and nasal sinuses: Secondary | ICD-10-CM

## 2018-12-12 DIAGNOSIS — R05 Cough: Secondary | ICD-10-CM

## 2018-12-12 DIAGNOSIS — R059 Cough, unspecified: Secondary | ICD-10-CM

## 2018-12-12 MED ORDER — AMOXICILLIN 875 MG PO TABS: 875.0000 mg | ORAL_TABLET | Freq: Two times a day (BID) | ORAL | 0 refills | Status: DC

## 2018-12-12 MED ORDER — HYDROCODONE-HOMATROPINE 5-1.5 MG/5ML PO SYRP: 5.0000 mL | ORAL_SOLUTION | Freq: Three times a day (TID) | ORAL | 0 refills | Status: AC | PRN

## 2018-12-12 MED FILL — AMOXICILLIN 875 MG TABS: 875 | 10 days supply | Qty: 20 | Fill #0

## 2018-12-12 MED FILL — HYDROCODONE-HOMATROPINE SOL: 5-1.5 | 5 days supply | Qty: 75 | Fill #0

## 2018-12-12 NOTE — Progress Notes (Signed)
Subjective:     Patient ID: Melissa Torres, female   DOB: October 05, 1985, 33 y.o.   MRN: [id number]  This visit type was conducted due to national recommendations for restrictions regarding the COVID-19 Pandemic (e.g. social distancing) in an effort to limit this patient's exposure and mitigate transmission in our community.  Due to their co-morbid illnesses, this patient is at least at moderate risk for complications without adequate follow up.  This format is felt to be most appropriate for this patient at this time.    Documentation for virtual audio and video telecommunications through Zoom encounter:  The patient was located at home. The provider was located in the office. The patient did consent to this visit and is aware of possible charges through their insurance for this visit.  The other persons participating in this telemedicine service were none. Time spent on call was 15  minutes and in review of previous records >15 minutes total.  This virtual service is not related to other E/M service within previous 7 days.   HPI Chief Complaint  Patient presents with  . sinus infection    productive cough, runny nose and drainage, sinus pressure x 7 days. taking otc robtussion, mucinex.  works for [employer] and going for covid test this morning to be on the safe side,   Virtual consult today.  Has had nasal congestion, productive cough, sinus pressure, scratchy throat, some sinus pressure.  Symptoms have been persistent for the past week but gradually getting worse.  Thought it could be allergies at first as she had to get a new pillow.  While awaiting her new pillow she used a pillow off the sofa that was a down pillow.   Had some initial stuffiness, but her symptoms change to sinus pressure, cough, congestion, ongoing sore throat, fatigue.  Cough is productive, has sinus pressure and postnasal drip.  Has used some Robitussin has used some Mucinex.  In general she gets scratchy throat every time  she has her menstrual period regularly.  No known covid contact, no sick contacts.  Goes to work or grocery and comes home.  Went this morning for covid test at [facility] site.   No SOB.  No fever.  No nausea, no vomiting.   No body aches, no chills.    Nonsmoker.    Works in [employer], Therapist, sports in operative services.    Clinical supervisor,   No other aggravating or relieving factors. No other complaint.   Past Medical History:  Diagnosis Date  . Hypoglycemia   . Hypothyroidism   . Internal derangement of right knee   . Newborn product of in vitro fertilization (IVF) pregnancy   . Unicornate uterus affecting pregnancy    Current Outpatient Medications on File Prior to Visit  Medication Sig Dispense Refill  . betamethasone valerate (VALISONE) 0.1 % cream Apply 1 application topically 2 (two) times daily as needed (for eczema). 45 g 1  . levothyroxine (SYNTHROID) 50 MCG tablet TAKE 1 TABLET (50 MCG TOTAL) BY MOUTH DAILY. 90 tablet 1  . Multiple Vitamin (MULTIVITAMIN) tablet Take 1 tablet by mouth daily.     No current facility-administered medications on file prior to visit.      Review of Systems As in subjective    Objective:   Physical Exam Due to coronavirus pandemic stay at home measures, patient visit was virtual and they were not examined in person.   BP 125/82   Pulse 82   Temp 98.8 F (37.1  C)   Ht 5\' 8"  (1.727 m)   Wt 215 lb (97.5 kg)   LMP 12/07/2018   Breastfeeding No   BMI 32.69 kg/m       Assessment:     Encounter Diagnoses  Name Primary?  . Sinus pressure Yes  . Cough   . Sore throat        Plan:     We discussed her symptoms and concerns.  Advise she could continue the Robitussin-DM, rest, hydrate well.  Discussed nasal saline flush and salt water gargles.  We discussed other supportive care for sore throat.  She went for Covid test this morning as her employer also asked her to stay at home until she has a result.  She can use the Hycodan  cough syrup as needed for worse cough, but overlap by at least 3 to 4 hours with the over-the-counter medication.  Discussed risk and benefits of medication and possible sedation.   Can begin Amoxicillin given the worsening sinus and throat symptoms, given the timeframe and gradual worsening.  However I advised that I cannot rule out Covid.  Thus will await the test as well.   Self Quarantine: The CDC, Centers for Disease Control has recommended a self quarantine of 10 days from the start of your illness until you are symptom-free including at least 24 hours of no symptoms including no fever, no shortness of breath, and no body aches and chills, by day 10 before returning to work or general contact with the public.  What does self quarantine mean: avoiding contact with people as much as possible.   Particularly in your house, isolate your self from others in a separate room, wear a mask when possible in the room, particularly if coughing a lot.   Have others bring food, water, medications, etc., to your door, but avoid direct contact with your household contacts during this time to avoid spreading the infection to them.   If you have a separate bathroom and living quarters during the next 2 weeks away from others, that would be preferable.    If you can't completely isolate, then wear a mask, wash hands frequently with soap and water for at least 15 seconds, minimize close contact with others, and have a friend or family member check regularly from a distance to make sure you are not getting seriously worse.     You should not be going out in public, should not be going to stores, to work or other public places until all your symptoms have resolved and at least 10 days + 24 hours of no symptoms at all have transpired.   Ideally you should avoid contact with others for a full 10 days if possible.  One of the goals is to limit spread to high risk people; people that are older and elderly, people with multiple  health issues like diabetes, heart disease, lung disease, and anybody that has weakened immune systems such as people with cancer or on immunosuppressive therapy.      Melissa Torres was seen today for sinus infection.  Diagnoses and all orders for this visit:  Sinus pressure  Cough  Sore throat  Other orders -     HYDROcodone-homatropine (HYCODAN) 5-1.5 MG/5ML syrup; Take 5 mLs by mouth every 8 (eight) hours as needed for up to 5 days. -     amoxicillin (AMOXIL) 875 MG tablet; Take 1 tablet (875 mg total) by mouth 2 (two) times daily.

## 2018-12-13 ENCOUNTER — Telehealth: Payer: Self-pay | Admitting: Medical

## 2018-12-13 NOTE — Telephone Encounter (Signed)
email has been sent to health at work as I could not reach them by phone.

## 2018-12-13 NOTE — Telephone Encounter (Signed)
Please call covid test site.  She went to covid swab yesterday morning even before my appt with her. I do not see test ordered or resulted in computer.   Please check on this with [facility] test site.

## 2019-02-14 ENCOUNTER — Other Ambulatory Visit: Payer: Self-pay | Admitting: Family Medicine

## 2019-02-14 DIAGNOSIS — E039 Hypothyroidism, unspecified: Secondary | ICD-10-CM

## 2019-02-14 MED FILL — LEVOTHYROXINE 50 MCG TABLET: 50 | 90 days supply | Qty: 90 | Fill #0

## 2019-02-14 MED FILL — BETAMETHASONE VALERATE 0.1: 0.1 | 15 days supply | Qty: 30 | Fill #1

## 2019-02-14 NOTE — Telephone Encounter (Signed)
Pt has an appt for cpe in february

## 2019-03-15 ENCOUNTER — Ambulatory Visit (INDEPENDENT_AMBULATORY_CARE_PROVIDER_SITE_OTHER): Payer: 59 | Admitting: Family Medicine

## 2019-03-15 ENCOUNTER — Other Ambulatory Visit: Payer: Self-pay

## 2019-03-15 ENCOUNTER — Encounter: Payer: Self-pay | Admitting: Family Medicine

## 2019-03-15 VITALS — BP 116/80 | HR 71 | Temp 97.3°F | Ht 68.25 in | Wt 227.2 lb

## 2019-03-15 DIAGNOSIS — Z0000 Encounter for general adult medical examination without abnormal findings: Secondary | ICD-10-CM | POA: Diagnosis not present

## 2019-03-15 DIAGNOSIS — Z833 Family history of diabetes mellitus: Secondary | ICD-10-CM | POA: Diagnosis not present

## 2019-03-15 DIAGNOSIS — E162 Hypoglycemia, unspecified: Secondary | ICD-10-CM | POA: Diagnosis not present

## 2019-03-15 DIAGNOSIS — E039 Hypothyroidism, unspecified: Secondary | ICD-10-CM | POA: Diagnosis not present

## 2019-03-15 DIAGNOSIS — E66811 Obesity, class 1: Secondary | ICD-10-CM | POA: Insufficient documentation

## 2019-03-15 DIAGNOSIS — E669 Obesity, unspecified: Secondary | ICD-10-CM | POA: Diagnosis not present

## 2019-03-15 DIAGNOSIS — E7800 Pure hypercholesterolemia, unspecified: Secondary | ICD-10-CM

## 2019-03-15 DIAGNOSIS — E282 Polycystic ovarian syndrome: Secondary | ICD-10-CM | POA: Diagnosis not present

## 2019-03-15 NOTE — Patient Instructions (Signed)
Preventive Care 21-34 Years Old, Female Preventive care refers to visits with your health care provider and lifestyle choices that can promote health and wellness. This includes:  A yearly physical exam. This may also be called an annual well check.  Regular dental visits and eye exams.  Immunizations.  Screening for certain conditions.  Healthy lifestyle choices, such as eating a healthy diet, getting regular exercise, not using drugs or products that contain nicotine and tobacco, and limiting alcohol use. What can I expect for my preventive care visit? Physical exam Your health care provider will check your:  Height and weight. This may be used to calculate body mass index (BMI), which tells if you are at a healthy weight.  Heart rate and blood pressure.  Skin for abnormal spots. Counseling Your health care provider may ask you questions about your:  Alcohol, tobacco, and drug use.  Emotional well-being.  Home and relationship well-being.  Sexual activity.  Eating habits.  Work and work environment.  Method of birth control.  Menstrual cycle.  Pregnancy history. What immunizations do I need?  Influenza (flu) vaccine  This is recommended every year. Tetanus, diphtheria, and pertussis (Tdap) vaccine  You may need a Td booster every 10 years. Varicella (chickenpox) vaccine  You may need this if you have not been vaccinated. Human papillomavirus (HPV) vaccine  If recommended by your health care provider, you may need three doses over 6 months. Measles, mumps, and rubella (MMR) vaccine  You may need at least one dose of MMR. You may also need a second dose. Meningococcal conjugate (MenACWY) vaccine  One dose is recommended if you are age 19-21 years and a first-year college student living in a residence hall, or if you have one of several medical conditions. You may also need additional booster doses. Pneumococcal conjugate (PCV13) vaccine  You may need  this if you have certain conditions and were not previously vaccinated. Pneumococcal polysaccharide (PPSV23) vaccine  You may need one or two doses if you smoke cigarettes or if you have certain conditions. Hepatitis A vaccine  You may need this if you have certain conditions or if you travel or work in places where you may be exposed to hepatitis A. Hepatitis B vaccine  You may need this if you have certain conditions or if you travel or work in places where you may be exposed to hepatitis B. Haemophilus influenzae type b (Hib) vaccine  You may need this if you have certain conditions. You may receive vaccines as individual doses or as more than one vaccine together in one shot (combination vaccines). Talk with your health care provider about the risks and benefits of combination vaccines. What tests do I need?  Blood tests  Lipid and cholesterol levels. These may be checked every 5 years starting at age 20.  Hepatitis C test.  Hepatitis B test. Screening  Diabetes screening. This is done by checking your blood sugar (glucose) after you have not eaten for a while (fasting).  Sexually transmitted disease (STD) testing.  BRCA-related cancer screening. This may be done if you have a family history of breast, ovarian, tubal, or peritoneal cancers.  Pelvic exam and Pap test. This may be done every 3 years starting at age 21. Starting at age 30, this may be done every 5 years if you have a Pap test in combination with an HPV test. Talk with your health care provider about your test results, treatment options, and if necessary, the need for more tests.   Follow these instructions at home: Eating and drinking   Eat a diet that includes fresh fruits and vegetables, whole grains, lean protein, and low-fat dairy.  Take vitamin and mineral supplements as recommended by your health care provider.  Do not drink alcohol if: ? Your health care provider tells you not to drink. ? You are  pregnant, may be pregnant, or are planning to become pregnant.  If you drink alcohol: ? Limit how much you have to 0-1 drink a day. ? Be aware of how much alcohol is in your drink. In the [place] one drink equals one 12 oz bottle of beer (355 mL), one 5 oz glass of wine (148 mL), or one 1 oz glass of hard liquor (44 mL). Lifestyle  Take daily care of your teeth and gums.  Stay active. Exercise for at least 30 minutes on 5 or more days each week.  Do not use any products that contain nicotine or tobacco, such as cigarettes, e-cigarettes, and chewing tobacco. If you need help quitting, ask your health care provider.  If you are sexually active, practice safe sex. Use a condom or other form of birth control (contraception) in order to prevent pregnancy and STIs (sexually transmitted infections). If you plan to become pregnant, see your health care provider for a preconception visit. What's next?  Visit your health care provider once a year for a well check visit.  Ask your health care provider how often you should have your eyes and teeth checked.  Stay up to date on all vaccines. This information is not intended to replace advice given to you by your health care provider. Make sure you discuss any questions you have with your health care provider. Document Revised: 09/23/2017 Document Reviewed: 09/23/2017 Elsevier Patient Education  2020 [employer].

## 2019-03-15 NOTE — Progress Notes (Signed)
Subjective:    Patient ID: Melissa Torres, female    DOB: 1985-06-01, 34 y.o.   MRN: [id number]  HPI Chief Complaint  Patient presents with  . fasting cpe    fasitng cpe,    She is here for a complete physical exam. Previous medical care: Last CPE: 02/25/2018  Other providers: Dr. Garwin Brothers- OB/GYN   Hypothyroidism- for years and it got worse with her pregnancy. She is on 25 mcg of levothyroxine.   History of hypoglycemia. Family history of diabetes. PCOS.    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   Health maintenance:  Mammogram: N/A Colonoscopy: N/A Last Gynecological Exam: 2018 and has appt scheduled with OB/GYN  Last Dental Exam: 2019  Last Eye Exam: 2020  Wears seatbelt always, uses sunscreen, smoke detectors in home and functioning, does not text while driving and feels safe in home environment.   Reviewed allergies, medications, past medical, surgical, family, and social history.     Review of Systems Review of Systems Constitutional: -fever, -chills, -sweats, -unexpected weight change,-fatigue ENT: -runny nose, -ear pain, -sore throat Cardiology:  -chest pain, -palpitations, -edema Respiratory: -cough, -shortness of breath, -wheezing Gastroenterology: -abdominal pain, -nausea, -vomiting, -diarrhea, -constipation  Hematology: -bleeding or bruising problems Musculoskeletal: -arthralgias, -myalgias, -joint swelling, -back pain Ophthalmology: -vision changes Urology: -dysuria, -difficulty urinating, -hematuria, -urinary frequency, -urgency Neurology: -headache, -weakness, -tingling, -numbness       Objective:   Physical Exam BP 116/80   Pulse 71   Temp (!) 97.3 F (36.3 C)   Ht 5' 8.25" (1.734 m)   Wt 227 lb 3.2 oz (103.1  kg)   LMP 03/08/2019   Breastfeeding No   BMI 34.29 kg/m   General Appearance:    Alert, cooperative, no distress, appears stated age  Head:    Normocephalic, without obvious abnormality, atraumatic  Eyes:    PERRL, conjunctiva/corneas clear, EOM's intact  Ears:    Normal TM's and external ear canals  Nose:   Mask   Throat:   Mask in place   Neck:   Supple, no lymphadenopathy;  thyroid:  no   enlargement/tenderness/nodules; no JVD  Back:    Spine nontender, no curvature, ROM normal, no CVA     tenderness  Lungs:     Clear to auscultation bilaterally without wheezes, rales or     ronchi; respirations unlabored  Chest Wall:    No tenderness or deformity   Heart:    Regular rate and rhythm, S1 and S2 normal, no murmur, rub   or gallop  Breast Exam:    OB/GYN  Abdomen:     Soft, non-tender, nondistended, normoactive bowel sounds,    no masses, no hepatosplenomegaly  Genitalia:     OB/GYN   Rectal:    Not performed due to age<40 and no related complaints  Extremities:   No clubbing, cyanosis or edema  Pulses:   2+ and symmetric all extremities  Skin:   Skin color, texture, turgor normal, no rashes or lesions  Lymph nodes:   Cervical, supraclavicular, and axillary nodes normal  Neurologic:   CNII-XII intact, normal strength, sensation and gait; reflexes 2+ and symmetric throughout          Psych:   Normal mood, affect,  hygiene and grooming.         Assessment & Plan:  Routine general medical examination at a health care facility - Plan: CBC with Differential/Platelet, Comprehensive metabolic panel, TSH, T4, free, Lipid panel -She is in her usual state of health and here for a fasting CPE.  Reviewed preventive health care.  She does have an OB/GYN and has an appointment.  Counseling on healthy lifestyle including diet and exercise.  Discussed safety and health promotion.  Reviewed immunizations  Hypoglycemia - Plan: Hemoglobin A1c -She has a history of hypoglycemia and we discussed  that at some point this could switch and become hyperglycemia.  She is eating small frequent meals and protein to regulate blood sugars  Hypothyroidism, unspecified type - Plan: TSH, T4, free -Currently on a low-dose of levothyroxine.  Check labs and follow-up  Obesity (BMI 30.0-34.9) - Plan: Lipid panel -Counseling on healthy diet and exercise  PCOS (polycystic ovarian syndrome) - Plan: Hemoglobin A1c -Managed by OB/GYN.  Discussed that this is an additional risk factor for diabetes  Family history of diabetes mellitus in mother - Plan: Hemoglobin A1c  Elevated LDL cholesterol level - Plan: Lipid panel -Counseled on low-fat

## 2019-03-16 LAB — HEMOGLOBIN A1C
Est. average glucose Bld gHb Est-mCnc: 108 mg/dL
Hgb A1c MFr Bld: 5.4 % (ref 4.8–5.6)

## 2019-03-16 LAB — CBC WITH DIFFERENTIAL/PLATELET
Basophils Absolute: 0.1 10*3/uL (ref 0.0–0.2)
Basos: 1 %
EOS (ABSOLUTE): 0.7 10*3/uL — ABNORMAL HIGH (ref 0.0–0.4)
Eos: 9 %
Hematocrit: 38.6 % (ref 34.0–46.6)
Hemoglobin: 13.2 g/dL (ref 11.1–15.9)
Immature Grans (Abs): 0 10*3/uL (ref 0.0–0.1)
Immature Granulocytes: 0 %
Lymphocytes Absolute: 2.4 10*3/uL (ref 0.7–3.1)
Lymphs: 33 %
MCH: 28.9 pg (ref 26.6–33.0)
MCHC: 34.2 g/dL (ref 31.5–35.7)
MCV: 85 fL (ref 79–97)
Monocytes Absolute: 0.5 10*3/uL (ref 0.1–0.9)
Monocytes: 7 %
Neutrophils Absolute: 3.7 10*3/uL (ref 1.4–7.0)
Neutrophils: 50 %
Platelets: 292 10*3/uL (ref 150–450)
RBC: 4.57 x10E6/uL (ref 3.77–5.28)
RDW: 13 % (ref 11.7–15.4)
WBC: 7.3 10*3/uL (ref 3.4–10.8)

## 2019-03-16 LAB — COMPREHENSIVE METABOLIC PANEL
ALT: 14 IU/L (ref 0–32)
AST: 11 IU/L (ref 0–40)
Albumin/Globulin Ratio: 1.7 (ref 1.2–2.2)
Albumin: 4.4 g/dL (ref 3.8–4.8)
Alkaline Phosphatase: 87 IU/L (ref 39–117)
BUN/Creatinine Ratio: 15 (ref 9–23)
BUN: 10 mg/dL (ref 6–20)
Bilirubin Total: 0.2 mg/dL (ref 0.0–1.2)
CO2: 25 mmol/L (ref 20–29)
Calcium: 9.6 mg/dL (ref 8.7–10.2)
Chloride: 103 mmol/L (ref 96–106)
Creatinine, Ser: 0.65 mg/dL (ref 0.57–1.00)
GFR calc Af Amer: 135 mL/min/{1.73_m2} (ref >59)
GFR calc non Af Amer: 117 mL/min/{1.73_m2} (ref >59)
Globulin, Total: 2.6 g/dL (ref 1.5–4.5)
Glucose: 82 mg/dL (ref 65–99)
Potassium: 4.9 mmol/L (ref 3.5–5.2)
Sodium: 141 mmol/L (ref 134–144)
Total Protein: 7 g/dL (ref 6.0–8.5)

## 2019-03-16 LAB — LIPID PANEL
Chol/HDL Ratio: 4.2 ratio (ref 0.0–4.4)
Cholesterol, Total: 201 mg/dL — ABNORMAL HIGH (ref 100–199)
HDL: 48 mg/dL (ref >39)
LDL Chol Calc (NIH): 135 mg/dL — ABNORMAL HIGH (ref 0–99)
Triglycerides: 99 mg/dL (ref 0–149)
VLDL Cholesterol Cal: 18 mg/dL (ref 5–40)

## 2019-03-16 LAB — TSH: TSH: 2.44 u[IU]/mL (ref 0.450–4.500)

## 2019-03-16 LAB — T4, FREE: Free T4: 1.22 ng/dL (ref 0.82–1.77)

## 2019-04-26 DIAGNOSIS — Z6834 Body mass index (BMI) 34.0-34.9, adult: Secondary | ICD-10-CM | POA: Diagnosis not present

## 2019-04-26 DIAGNOSIS — Z1151 Encounter for screening for human papillomavirus (HPV): Secondary | ICD-10-CM | POA: Diagnosis not present

## 2019-04-26 DIAGNOSIS — Z01419 Encounter for gynecological examination (general) (routine) without abnormal findings: Secondary | ICD-10-CM | POA: Diagnosis not present

## 2019-05-16 ENCOUNTER — Other Ambulatory Visit: Payer: Self-pay | Admitting: Internal Medicine

## 2019-05-16 DIAGNOSIS — E039 Hypothyroidism, unspecified: Secondary | ICD-10-CM

## 2019-05-16 MED ORDER — LEVOTHYROXINE SODIUM 50 MCG PO TABS: 50.0000 ug | ORAL_TABLET | Freq: Every day | ORAL | 1 refills | Status: DC

## 2019-05-16 MED FILL — LEVOTHYROXINE 50 MCG TABLET: 50 | 90 days supply | Qty: 90 | Fill #0

## 2019-06-22 ENCOUNTER — Telehealth: Payer: Self-pay | Admitting: Internal Medicine

## 2019-06-22 NOTE — Telephone Encounter (Signed)
Pt was notified already.

## 2019-06-22 NOTE — Telephone Encounter (Signed)
She should stay on the current dose she is taking since her TSH was normal.

## 2019-06-22 NOTE — Telephone Encounter (Signed)
Pt called today questioning her thyroid dose. As a new pt back in 1/ 2020 she was on daily and has been on that ever since. cpe from this year says she is on but never filled this for her before. Only always filled . She went to obgyn to get placed on birth control for periods and the dosing was asking and she remember being told but her bottle is . Just wanted to send to you incase you remember something  I advised her that her levels have always been good so we have not changed dose.

## 2019-06-23 DIAGNOSIS — N926 Irregular menstruation, unspecified: Secondary | ICD-10-CM | POA: Diagnosis not present

## 2019-06-23 DIAGNOSIS — Z3689 Encounter for other specified antenatal screening: Secondary | ICD-10-CM | POA: Diagnosis not present

## 2019-06-30 DIAGNOSIS — Z3201 Encounter for pregnancy test, result positive: Secondary | ICD-10-CM | POA: Diagnosis not present

## 2019-07-12 DIAGNOSIS — M6208 Separation of muscle (nontraumatic), other site: Secondary | ICD-10-CM | POA: Diagnosis not present

## 2019-07-12 DIAGNOSIS — K420 Umbilical hernia with obstruction, without gangrene: Secondary | ICD-10-CM | POA: Diagnosis not present

## 2019-07-13 DIAGNOSIS — Z3481 Encounter for supervision of other normal pregnancy, first trimester: Secondary | ICD-10-CM | POA: Diagnosis not present

## 2019-07-13 DIAGNOSIS — Z3689 Encounter for other specified antenatal screening: Secondary | ICD-10-CM | POA: Diagnosis not present

## 2019-07-13 DIAGNOSIS — E039 Hypothyroidism, unspecified: Secondary | ICD-10-CM | POA: Diagnosis not present

## 2019-07-20 DIAGNOSIS — Z3480 Encounter for supervision of other normal pregnancy, unspecified trimester: Secondary | ICD-10-CM | POA: Diagnosis not present

## 2019-07-20 DIAGNOSIS — Z113 Encounter for screening for infections with a predominantly sexual mode of transmission: Secondary | ICD-10-CM | POA: Diagnosis not present

## 2019-07-20 DIAGNOSIS — Z3689 Encounter for other specified antenatal screening: Secondary | ICD-10-CM | POA: Diagnosis not present

## 2019-07-20 DIAGNOSIS — Z3481 Encounter for supervision of other normal pregnancy, first trimester: Secondary | ICD-10-CM | POA: Diagnosis not present

## 2019-08-07 ENCOUNTER — Telehealth: Payer: Self-pay | Admitting: Internal Medicine

## 2019-08-07 MED FILL — LEVOTHYROXINE 50 MCG TABLET: 50 | 90 days supply | Qty: 90 | Fill #1

## 2019-08-07 NOTE — Telephone Encounter (Signed)
Pt asked for a refill on tsh but looking back through chart, we refilled on 05/16/19 for 6 months. Pt dropped some pills down sink and needs ok to refill it early.   Called pharmacy and they will fill this for pt

## 2019-08-17 ENCOUNTER — Encounter: Payer: Self-pay | Admitting: Medical

## 2019-08-17 ENCOUNTER — Telehealth: Payer: Self-pay | Admitting: Internal Medicine

## 2019-08-17 NOTE — Telephone Encounter (Signed)
She is currently pregnant and high risk pregnant and does not want to cause more issues with her pregnancy by taking the vaccine

## 2019-08-17 NOTE — Telephone Encounter (Signed)
I typed a letter on her behalf.   I printed at the nurse station

## 2019-08-17 NOTE — Telephone Encounter (Signed)
Pt called and works for [employer] and would like note stating she is exempted from the covid shot. She is allergic to flu shots and is very sensitive to vaccines and wants to be exempted from covid. Please advise

## 2019-08-17 NOTE — Telephone Encounter (Signed)
Pt.notified

## 2019-08-21 DIAGNOSIS — Z361 Encounter for antenatal screening for raised alphafetoprotein level: Secondary | ICD-10-CM | POA: Diagnosis not present

## 2019-08-21 DIAGNOSIS — O34219 Maternal care for unspecified type scar from previous cesarean delivery: Secondary | ICD-10-CM | POA: Diagnosis not present

## 2019-09-21 DIAGNOSIS — Z363 Encounter for antenatal screening for malformations: Secondary | ICD-10-CM | POA: Diagnosis not present

## 2019-10-04 DIAGNOSIS — Z362 Encounter for other antenatal screening follow-up: Secondary | ICD-10-CM | POA: Diagnosis not present

## 2019-10-20 DIAGNOSIS — Z3689 Encounter for other specified antenatal screening: Secondary | ICD-10-CM | POA: Diagnosis not present

## 2019-11-03 ENCOUNTER — Other Ambulatory Visit: Payer: Self-pay | Admitting: Family Medicine

## 2019-11-03 DIAGNOSIS — Z3A26 26 weeks gestation of pregnancy: Secondary | ICD-10-CM | POA: Diagnosis not present

## 2019-11-03 DIAGNOSIS — Z3689 Encounter for other specified antenatal screening: Secondary | ICD-10-CM | POA: Diagnosis not present

## 2019-11-03 DIAGNOSIS — E039 Hypothyroidism, unspecified: Secondary | ICD-10-CM

## 2019-11-03 DIAGNOSIS — O99281 Endocrine, nutritional and metabolic diseases complicating pregnancy, first trimester: Secondary | ICD-10-CM | POA: Diagnosis not present

## 2019-11-03 MED FILL — LEVOTHYROXINE 50 MCG TABLET: 50 | 90 days supply | Qty: 90 | Fill #0

## 2019-11-13 DIAGNOSIS — Z3A27 27 weeks gestation of pregnancy: Secondary | ICD-10-CM | POA: Diagnosis not present

## 2019-11-13 DIAGNOSIS — O99281 Endocrine, nutritional and metabolic diseases complicating pregnancy, first trimester: Secondary | ICD-10-CM | POA: Diagnosis not present

## 2019-11-27 DIAGNOSIS — Z3689 Encounter for other specified antenatal screening: Secondary | ICD-10-CM | POA: Diagnosis not present

## 2019-12-11 ENCOUNTER — Other Ambulatory Visit (HOSPITAL_COMMUNITY): Payer: Self-pay | Admitting: Obstetrics and Gynecology

## 2019-12-11 DIAGNOSIS — Z3A31 31 weeks gestation of pregnancy: Secondary | ICD-10-CM | POA: Diagnosis not present

## 2019-12-11 DIAGNOSIS — O99283 Endocrine, nutritional and metabolic diseases complicating pregnancy, third trimester: Secondary | ICD-10-CM | POA: Diagnosis not present

## 2019-12-11 DIAGNOSIS — O99013 Anemia complicating pregnancy, third trimester: Secondary | ICD-10-CM | POA: Diagnosis not present

## 2019-12-11 MED FILL — FERREX 150 CAPSULE: 150 | 90 days supply | Qty: 90 | Fill #0

## 2019-12-15 DIAGNOSIS — Z0371 Encounter for suspected problem with amniotic cavity and membrane ruled out: Secondary | ICD-10-CM | POA: Diagnosis not present

## 2019-12-26 DIAGNOSIS — O99283 Endocrine, nutritional and metabolic diseases complicating pregnancy, third trimester: Secondary | ICD-10-CM | POA: Diagnosis not present

## 2019-12-26 DIAGNOSIS — Z3A33 33 weeks gestation of pregnancy: Secondary | ICD-10-CM | POA: Diagnosis not present

## 2019-12-26 DIAGNOSIS — O99013 Anemia complicating pregnancy, third trimester: Secondary | ICD-10-CM | POA: Diagnosis not present

## 2019-12-31 DIAGNOSIS — O99013 Anemia complicating pregnancy, third trimester: Secondary | ICD-10-CM | POA: Diagnosis not present

## 2019-12-31 DIAGNOSIS — O99283 Endocrine, nutritional and metabolic diseases complicating pregnancy, third trimester: Secondary | ICD-10-CM | POA: Diagnosis not present

## 2019-12-31 DIAGNOSIS — Z3685 Encounter for antenatal screening for Streptococcus B: Secondary | ICD-10-CM | POA: Diagnosis not present

## 2019-12-31 DIAGNOSIS — Z3A35 35 weeks gestation of pregnancy: Secondary | ICD-10-CM | POA: Diagnosis not present

## 2019-12-31 DIAGNOSIS — E039 Hypothyroidism, unspecified: Secondary | ICD-10-CM | POA: Diagnosis not present

## 2019-12-31 DIAGNOSIS — Z3480 Encounter for supervision of other normal pregnancy, unspecified trimester: Secondary | ICD-10-CM | POA: Diagnosis not present

## 2020-01-03 ENCOUNTER — Other Ambulatory Visit (INDEPENDENT_AMBULATORY_CARE_PROVIDER_SITE_OTHER): Payer: 59

## 2020-01-03 ENCOUNTER — Other Ambulatory Visit: Payer: Self-pay

## 2020-01-03 ENCOUNTER — Telehealth: Payer: Self-pay | Admitting: Internal Medicine

## 2020-01-03 DIAGNOSIS — Z3A35 35 weeks gestation of pregnancy: Secondary | ICD-10-CM

## 2020-01-03 DIAGNOSIS — Z23 Encounter for immunization: Secondary | ICD-10-CM

## 2020-01-03 NOTE — Telephone Encounter (Signed)
I prefer she get this at her OB/GYN office just in case there is a reason they are not giving it to her. It is recommended from 27 weeks on but I am more comfortable with them administering this for her.

## 2020-01-03 NOTE — Addendum Note (Signed)
Addended by: Herminio Commons A on: 01/03/2020 08:58 AM   Modules accepted: Orders

## 2020-01-03 NOTE — Telephone Encounter (Signed)
Obgyn ran out of vaccines and then next appt is not until Dec 14th and she will be over 36 weeks. Per vickie ok to come in to get it. As she is in her window

## 2020-01-03 NOTE — Telephone Encounter (Signed)
Coming in today

## 2020-01-03 NOTE — Telephone Encounter (Signed)
Pt needs a tdap today as she is in her 3rd trimester of pregnancy. Please advise if ok. obgyn is being difficult to get her in for a tdap. Pt wants to come in today to get this done

## 2020-01-03 NOTE — Telephone Encounter (Signed)
I am fine with this since she is having trouble getting it at her OB/GYN office and as long as she has never had a bad reaction to this vaccine in the past.

## 2020-01-10 DIAGNOSIS — E039 Hypothyroidism, unspecified: Secondary | ICD-10-CM | POA: Diagnosis not present

## 2020-01-10 DIAGNOSIS — Z3A35 35 weeks gestation of pregnancy: Secondary | ICD-10-CM | POA: Diagnosis not present

## 2020-01-10 DIAGNOSIS — O99283 Endocrine, nutritional and metabolic diseases complicating pregnancy, third trimester: Secondary | ICD-10-CM | POA: Diagnosis not present

## 2020-01-10 DIAGNOSIS — O99013 Anemia complicating pregnancy, third trimester: Secondary | ICD-10-CM | POA: Diagnosis not present

## 2020-01-10 DIAGNOSIS — Z3685 Encounter for antenatal screening for Streptococcus B: Secondary | ICD-10-CM | POA: Diagnosis not present

## 2020-01-10 DIAGNOSIS — Z7689 Persons encountering health services in other specified circumstances: Secondary | ICD-10-CM | POA: Diagnosis not present

## 2020-01-12 DIAGNOSIS — Z3482 Encounter for supervision of other normal pregnancy, second trimester: Secondary | ICD-10-CM | POA: Diagnosis not present

## 2020-01-12 DIAGNOSIS — Z3483 Encounter for supervision of other normal pregnancy, third trimester: Secondary | ICD-10-CM | POA: Diagnosis not present

## 2020-01-18 ENCOUNTER — Other Ambulatory Visit: Payer: Self-pay | Admitting: Obstetrics and Gynecology

## 2020-01-18 ENCOUNTER — Encounter (HOSPITAL_COMMUNITY): Payer: Self-pay

## 2020-01-18 NOTE — Patient Instructions (Signed)
Melissa Torres  01/18/2020   Your procedure is scheduled on:  02/02/2020  Arrive at 0730 at [facility] on [address] at [facility]  and [address]. You are invited to use the FREE valet parking or use the Visitor's parking deck.  Pick up the phone at the desk and dial [phone].  Call this number if you have problems the morning of surgery: [phone]  Remember:   Do not eat food:(After Midnight) Desps de medianoche.  Do not drink clear liquids: (After Midnight) Desps de medianoche.  Take these medicines the morning of surgery with A SIP OF WATER:  Take levothyroxine as prescribed   Do not wear jewelry, make-up or nail polish.  Do not wear lotions, powders, or perfumes. Do not wear deodorant.  Do not shave 48 hours prior to surgery.  Do not bring valuables to the hospital.  [facility] is not   responsible for any belongings or valuables brought to the hospital.  Contacts, dentures or bridgework may not be worn into surgery.  Leave suitcase in the car. After surgery it may be brought to your room.  For patients admitted to the hospital, checkout time is 11:00 AM the day of              discharge.      Please read over the following fact sheets that you were given:     Preparing for Surgery

## 2020-01-31 ENCOUNTER — Encounter (HOSPITAL_COMMUNITY)
Admission: RE | Admit: 2020-01-31 | Discharge: 2020-01-31 | Disposition: A | Discharge status: Home / Self Care | Payer: 59 | Source: Ambulatory Visit | Attending: Obstetrics and Gynecology | Admitting: Obstetrics and Gynecology

## 2020-01-31 ENCOUNTER — Inpatient Hospital Stay (HOSPITAL_COMMUNITY)
Admission: EM | Admit: 2020-01-31 | Discharge: 2020-01-31 | Disposition: A | Discharge status: Home / Self Care | Payer: 59 | Attending: Obstetrics and Gynecology | Admitting: Obstetrics and Gynecology

## 2020-01-31 ENCOUNTER — Other Ambulatory Visit (HOSPITAL_COMMUNITY)
Admission: RE | Admit: 2020-01-31 | Discharge: 2020-01-31 | Disposition: A | Discharge status: Home / Self Care | Payer: 59 | Source: Ambulatory Visit | Attending: Obstetrics and Gynecology | Admitting: Obstetrics and Gynecology

## 2020-01-31 DIAGNOSIS — Z20822 Contact with and (suspected) exposure to covid-19: Secondary | ICD-10-CM | POA: Insufficient documentation

## 2020-01-31 DIAGNOSIS — Z3A40 40 weeks gestation of pregnancy: Secondary | ICD-10-CM | POA: Diagnosis not present

## 2020-01-31 DIAGNOSIS — O9902 Anemia complicating childbirth: Secondary | ICD-10-CM | POA: Diagnosis not present

## 2020-01-31 DIAGNOSIS — O480 Post-term pregnancy: Secondary | ICD-10-CM | POA: Diagnosis not present

## 2020-01-31 DIAGNOSIS — R1032 Left lower quadrant pain: Secondary | ICD-10-CM | POA: Diagnosis not present

## 2020-01-31 DIAGNOSIS — E039 Hypothyroidism, unspecified: Secondary | ICD-10-CM | POA: Diagnosis not present

## 2020-01-31 DIAGNOSIS — Z302 Encounter for sterilization: Secondary | ICD-10-CM | POA: Diagnosis not present

## 2020-01-31 DIAGNOSIS — O34211 Maternal care for low transverse scar from previous cesarean delivery: Secondary | ICD-10-CM | POA: Diagnosis not present

## 2020-01-31 DIAGNOSIS — O99893 Other specified diseases and conditions complicating puerperium: Secondary | ICD-10-CM | POA: Diagnosis not present

## 2020-01-31 DIAGNOSIS — Z01812 Encounter for preprocedural laboratory examination: Secondary | ICD-10-CM | POA: Insufficient documentation

## 2020-01-31 DIAGNOSIS — O321XX0 Maternal care for breech presentation, not applicable or unspecified: Secondary | ICD-10-CM | POA: Diagnosis not present

## 2020-01-31 DIAGNOSIS — Z3A39 39 weeks gestation of pregnancy: Secondary | ICD-10-CM | POA: Diagnosis not present

## 2020-01-31 DIAGNOSIS — O99284 Endocrine, nutritional and metabolic diseases complicating childbirth: Secondary | ICD-10-CM | POA: Diagnosis not present

## 2020-01-31 LAB — CBC
HCT: 38 % (ref 36.0–46.0)
Hemoglobin: 12.2 g/dL (ref 12.0–15.0)
MCH: 28.3 pg (ref 26.0–34.0)
MCHC: 32.1 g/dL (ref 30.0–36.0)
MCV: 88.2 fL (ref 80.0–100.0)
Platelets: 246 10*3/uL (ref 150–400)
RBC: 4.31 MIL/uL (ref 3.87–5.11)
RDW: 15.9 % — ABNORMAL HIGH (ref 11.5–15.5)
WBC: 9.6 10*3/uL (ref 4.0–10.5)
nRBC: 0 % (ref 0.0–0.2)

## 2020-01-31 LAB — TYPE AND SCREEN
ABO/RH(D): O POS
Antibody Screen: NEGATIVE

## 2020-01-31 LAB — SARS CORONAVIRUS 2 (TAT 6-24 HRS): SARS Coronavirus 2: NEGATIVE

## 2020-01-31 MED FILL — LEVOTHYROXINE 50 MCG TABLET: 50 | 90 days supply | Qty: 90 | Fill #1

## 2020-02-01 LAB — RPR: RPR Ser Ql: NONREACTIVE

## 2020-02-02 ENCOUNTER — Other Ambulatory Visit: Payer: Self-pay

## 2020-02-02 ENCOUNTER — Encounter (HOSPITAL_COMMUNITY)
Admission: RE | Disposition: A | Discharge status: Home / Self Care | Payer: Self-pay | Source: Home / Self Care | Attending: Obstetrics and Gynecology

## 2020-02-02 ENCOUNTER — Inpatient Hospital Stay (HOSPITAL_COMMUNITY)
Admission: RE | Admit: 2020-02-02 | Discharge: 2020-02-04 | DRG: 785 | Disposition: A | Discharge status: Home / Self Care | Payer: 59 | Attending: Obstetrics and Gynecology | Admitting: Obstetrics and Gynecology

## 2020-02-02 ENCOUNTER — Inpatient Hospital Stay (HOSPITAL_COMMUNITY): Payer: 59 | Admitting: Anesthesiology

## 2020-02-02 ENCOUNTER — Encounter (HOSPITAL_COMMUNITY): Payer: Self-pay | Admitting: Obstetrics and Gynecology

## 2020-02-02 DIAGNOSIS — O9902 Anemia complicating childbirth: Secondary | ICD-10-CM | POA: Diagnosis present

## 2020-02-02 DIAGNOSIS — R1032 Left lower quadrant pain: Secondary | ICD-10-CM | POA: Diagnosis not present

## 2020-02-02 DIAGNOSIS — E039 Hypothyroidism, unspecified: Secondary | ICD-10-CM | POA: Diagnosis present

## 2020-02-02 DIAGNOSIS — O99284 Endocrine, nutritional and metabolic diseases complicating childbirth: Secondary | ICD-10-CM | POA: Diagnosis present

## 2020-02-02 DIAGNOSIS — Z3A39 39 weeks gestation of pregnancy: Secondary | ICD-10-CM | POA: Diagnosis not present

## 2020-02-02 DIAGNOSIS — Z98891 History of uterine scar from previous surgery: Secondary | ICD-10-CM

## 2020-02-02 DIAGNOSIS — Z302 Encounter for sterilization: Secondary | ICD-10-CM | POA: Diagnosis not present

## 2020-02-02 DIAGNOSIS — O34219 Maternal care for unspecified type scar from previous cesarean delivery: Secondary | ICD-10-CM | POA: Diagnosis present

## 2020-02-02 DIAGNOSIS — Z9851 Tubal ligation status: Secondary | ICD-10-CM

## 2020-02-02 DIAGNOSIS — O34211 Maternal care for low transverse scar from previous cesarean delivery: Principal | ICD-10-CM | POA: Diagnosis present

## 2020-02-02 DIAGNOSIS — O99893 Other specified diseases and conditions complicating puerperium: Secondary | ICD-10-CM | POA: Diagnosis not present

## 2020-02-02 DIAGNOSIS — Z20822 Contact with and (suspected) exposure to covid-19: Secondary | ICD-10-CM | POA: Diagnosis present

## 2020-02-02 SURGERY — Surgical Case
Anesthesia: Spinal | Wound class: Clean Contaminated

## 2020-02-02 MED ORDER — DIPHENHYDRAMINE HCL 25 MG PO CAPS: 25.0000 mg | ORAL_CAPSULE | Freq: Four times a day (QID) | ORAL | Status: DC | PRN

## 2020-02-02 MED ORDER — METOCLOPRAMIDE HCL 5 MG/ML IJ SOLN
INTRAMUSCULAR | Status: DC | PRN
  Administered 2020-02-02: 10 mg via INTRAVENOUS

## 2020-02-02 MED ORDER — OXYTOCIN-SODIUM CHLORIDE 30-0.9 UT/500ML-% IV SOLN
INTRAVENOUS | Status: DC | PRN
  Administered 2020-02-02: 30 [IU] via INTRAVENOUS

## 2020-02-02 MED ORDER — DIPHENHYDRAMINE HCL 50 MG/ML IJ SOLN: 12.5000 mg | INTRAMUSCULAR | Status: DC | PRN

## 2020-02-02 MED ORDER — DIBUCAINE (PERIANAL) 1 % EX OINT: 1.0000 "application " | TOPICAL_OINTMENT | CUTANEOUS | Status: DC | PRN

## 2020-02-02 MED ORDER — NALBUPHINE HCL 10 MG/ML IJ SOLN: 5.0000 mg | INTRAMUSCULAR | Status: DC | PRN

## 2020-02-02 MED ORDER — DIPHENHYDRAMINE HCL 25 MG PO CAPS: 25.0000 mg | ORAL_CAPSULE | ORAL | Status: DC | PRN

## 2020-02-02 MED ORDER — SODIUM CHLORIDE 0.9% FLUSH: 3.0000 mL | INTRAVENOUS | Status: DC | PRN

## 2020-02-02 MED ORDER — NALBUPHINE HCL 10 MG/ML IJ SOLN: 5.0000 mg | Freq: Once | INTRAMUSCULAR | Status: DC | PRN

## 2020-02-02 MED ORDER — LACTATED RINGERS IV BOLUS
500.0000 mL | Freq: Once | INTRAVENOUS | Status: AC
  Administered 2020-02-02: 500 mL via INTRAVENOUS

## 2020-02-02 MED ORDER — ACETAMINOPHEN 500 MG PO TABS
1000.0000 mg | ORAL_TABLET | Freq: Four times a day (QID) | ORAL | Status: AC
  Administered 2020-02-02 – 2020-02-03 (×3): 1000 mg via ORAL
  Filled 2020-02-02 (×3): qty 2

## 2020-02-02 MED ORDER — IBUPROFEN 800 MG PO TABS
800.0000 mg | ORAL_TABLET | Freq: Four times a day (QID) | ORAL | Status: DC
  Administered 2020-02-03 – 2020-02-04 (×4): 800 mg via ORAL
  Filled 2020-02-02 (×5): qty 1

## 2020-02-02 MED ORDER — KETOROLAC TROMETHAMINE 30 MG/ML IJ SOLN: 30.0000 mg | Freq: Four times a day (QID) | INTRAMUSCULAR | Status: DC | PRN

## 2020-02-02 MED ORDER — MORPHINE SULFATE (PF) 0.5 MG/ML IJ SOLN
INTRAMUSCULAR | Status: DC | PRN
  Administered 2020-02-02: .15 mg via INTRATHECAL

## 2020-02-02 MED ORDER — ZOLPIDEM TARTRATE 5 MG PO TABS: 5.0000 mg | ORAL_TABLET | Freq: Every evening | ORAL | Status: DC | PRN

## 2020-02-02 MED ORDER — NALOXONE HCL 0.4 MG/ML IJ SOLN: 0.4000 mg | INTRAMUSCULAR | Status: DC | PRN

## 2020-02-02 MED ORDER — PHENYLEPHRINE HCL-NACL 20-0.9 MG/250ML-% IV SOLN
INTRAVENOUS | Status: AC
  Filled 2020-02-02: qty 250

## 2020-02-02 MED ORDER — LACTATED RINGERS IV SOLN: INTRAVENOUS | Status: DC

## 2020-02-02 MED ORDER — BUPIVACAINE HCL (PF) 0.25 % IJ SOLN
INTRAMUSCULAR | Status: AC
  Filled 2020-02-02: qty 20

## 2020-02-02 MED ORDER — BUPIVACAINE HCL (PF) 0.25 % IJ SOLN
INTRAMUSCULAR | Status: DC | PRN
  Administered 2020-02-02: 20 mL

## 2020-02-02 MED ORDER — ACETAMINOPHEN 500 MG PO TABS: 1000.0000 mg | ORAL_TABLET | Freq: Four times a day (QID) | ORAL | Status: DC

## 2020-02-02 MED ORDER — KETOROLAC TROMETHAMINE 30 MG/ML IJ SOLN
30.0000 mg | Freq: Once | INTRAMUSCULAR | Status: AC | PRN
  Administered 2020-02-02: 30 mg via INTRAVENOUS

## 2020-02-02 MED ORDER — ONDANSETRON HCL 4 MG/2ML IJ SOLN
INTRAMUSCULAR | Status: AC
  Filled 2020-02-02: qty 2

## 2020-02-02 MED ORDER — CEFAZOLIN SODIUM-DEXTROSE 2-4 GM/100ML-% IV SOLN
INTRAVENOUS | Status: AC
  Filled 2020-02-02: qty 100

## 2020-02-02 MED ORDER — KETOROLAC TROMETHAMINE 30 MG/ML IJ SOLN: 30.0000 mg | Freq: Four times a day (QID) | INTRAMUSCULAR | Status: AC | PRN

## 2020-02-02 MED ORDER — ONDANSETRON HCL 4 MG/2ML IJ SOLN: 4.0000 mg | Freq: Three times a day (TID) | INTRAMUSCULAR | Status: DC | PRN

## 2020-02-02 MED ORDER — LEVOTHYROXINE SODIUM 50 MCG PO TABS
50.0000 ug | ORAL_TABLET | Freq: Every day | ORAL | Status: DC
  Administered 2020-02-03 – 2020-02-04 (×2): 50 ug via ORAL
  Filled 2020-02-02 (×2): qty 1

## 2020-02-02 MED ORDER — FENTANYL CITRATE (PF) 100 MCG/2ML IJ SOLN
25.0000 ug | INTRAMUSCULAR | Status: DC | PRN
  Administered 2020-02-02: 100 ug via INTRAVENOUS

## 2020-02-02 MED ORDER — METHYLERGONOVINE MALEATE 0.2 MG/ML IJ SOLN: 0.2000 mg | INTRAMUSCULAR | Status: DC | PRN

## 2020-02-02 MED ORDER — KETOROLAC TROMETHAMINE 30 MG/ML IJ SOLN: 30.0000 mg | Freq: Once | INTRAMUSCULAR | Status: DC | PRN

## 2020-02-02 MED ORDER — CEFAZOLIN SODIUM-DEXTROSE 2-4 GM/100ML-% IV SOLN
2.0000 g | INTRAVENOUS | Status: AC
  Administered 2020-02-02: 2 g via INTRAVENOUS

## 2020-02-02 MED ORDER — OXYCODONE-ACETAMINOPHEN 5-325 MG PO TABS
1.0000 | ORAL_TABLET | ORAL | Status: DC | PRN
  Administered 2020-02-03 – 2020-02-04 (×2): 1 via ORAL
  Filled 2020-02-02 (×2): qty 1

## 2020-02-02 MED ORDER — METOCLOPRAMIDE HCL 5 MG/ML IJ SOLN
INTRAMUSCULAR | Status: AC
  Filled 2020-02-02: qty 2

## 2020-02-02 MED ORDER — FENTANYL CITRATE (PF) 100 MCG/2ML IJ SOLN
INTRAMUSCULAR | Status: DC | PRN
  Administered 2020-02-02: 15 ug via INTRATHECAL

## 2020-02-02 MED ORDER — BUPIVACAINE IN DEXTROSE 0.75-8.25 % IT SOLN
INTRATHECAL | Status: DC | PRN
  Administered 2020-02-02: 1.6 mL via INTRATHECAL

## 2020-02-02 MED ORDER — ONDANSETRON HCL 4 MG/2ML IJ SOLN
4.0000 mg | Freq: Three times a day (TID) | INTRAMUSCULAR | Status: DC | PRN
Start: 2020-02-02 — End: 2020-02-04
  Administered 2020-02-02: 4 mg via INTRAVENOUS

## 2020-02-02 MED ORDER — SIMETHICONE 80 MG PO CHEW: 80.0000 mg | CHEWABLE_TABLET | ORAL | Status: DC | PRN

## 2020-02-02 MED ORDER — SIMETHICONE 80 MG PO CHEW
80.0000 mg | CHEWABLE_TABLET | Freq: Three times a day (TID) | ORAL | Status: DC
  Administered 2020-02-02 – 2020-02-04 (×5): 80 mg via ORAL
  Filled 2020-02-02 (×5): qty 1

## 2020-02-02 MED ORDER — DEXAMETHASONE SODIUM PHOSPHATE 10 MG/ML IJ SOLN
INTRAMUSCULAR | Status: AC
  Filled 2020-02-02: qty 1

## 2020-02-02 MED ORDER — SCOPOLAMINE 1 MG/3DAYS TD PT72: 1.0000 | MEDICATED_PATCH | Freq: Once | TRANSDERMAL | Status: DC

## 2020-02-02 MED ORDER — MENTHOL 3 MG MT LOZG: 1.0000 | LOZENGE | OROMUCOSAL | Status: DC | PRN

## 2020-02-02 MED ORDER — NALOXONE HCL 4 MG/10ML IJ SOLN
1.0000 ug/kg/h | INTRAMUSCULAR | Status: DC | PRN
  Filled 2020-02-02: qty 5

## 2020-02-02 MED ORDER — NALOXONE HCL 4 MG/10ML IJ SOLN: 1.0000 ug/kg/h | INTRAVENOUS | Status: DC | PRN

## 2020-02-02 MED ORDER — PROMETHAZINE HCL 25 MG/ML IJ SOLN
6.2500 mg | INTRAMUSCULAR | Status: DC | PRN
Start: 2020-02-02 — End: 2020-02-02

## 2020-02-02 MED ORDER — MEPERIDINE HCL 25 MG/ML IJ SOLN: 6.2500 mg | INTRAMUSCULAR | Status: DC | PRN

## 2020-02-02 MED ORDER — KETOROLAC TROMETHAMINE 30 MG/ML IJ SOLN
30.0000 mg | Freq: Four times a day (QID) | INTRAMUSCULAR | Status: AC
  Administered 2020-02-02 – 2020-02-03 (×3): 30 mg via INTRAVENOUS
  Filled 2020-02-02 (×3): qty 1

## 2020-02-02 MED ORDER — WITCH HAZEL-GLYCERIN EX PADS: 1.0000 "application " | MEDICATED_PAD | CUTANEOUS | Status: DC | PRN

## 2020-02-02 MED ORDER — SCOPOLAMINE 1 MG/3DAYS TD PT72
1.0000 | MEDICATED_PATCH | Freq: Once | TRANSDERMAL | Status: DC
  Administered 2020-02-02: 1.5 mg via TRANSDERMAL

## 2020-02-02 MED ORDER — PRENATAL MULTIVITAMIN CH
1.0000 | ORAL_TABLET | Freq: Every day | ORAL | Status: DC
  Administered 2020-02-03 – 2020-02-04 (×2): 1 via ORAL
  Filled 2020-02-02 (×2): qty 1

## 2020-02-02 MED ORDER — FENTANYL CITRATE (PF) 100 MCG/2ML IJ SOLN
INTRAMUSCULAR | Status: AC
  Filled 2020-02-02: qty 2

## 2020-02-02 MED ORDER — ACETAMINOPHEN 10 MG/ML IV SOLN
INTRAVENOUS | Status: DC | PRN
  Administered 2020-02-02: 1000 mg via INTRAVENOUS

## 2020-02-02 MED ORDER — COCONUT OIL OIL: 1.0000 "application " | TOPICAL_OIL | Status: DC | PRN

## 2020-02-02 MED ORDER — OXYTOCIN-SODIUM CHLORIDE 30-0.9 UT/500ML-% IV SOLN
INTRAVENOUS | Status: AC
  Filled 2020-02-02: qty 500

## 2020-02-02 MED ORDER — ACETAMINOPHEN 10 MG/ML IV SOLN
INTRAVENOUS | Status: AC
  Filled 2020-02-02: qty 100

## 2020-02-02 MED ORDER — MORPHINE SULFATE (PF) 0.5 MG/ML IJ SOLN
INTRAMUSCULAR | Status: AC
  Filled 2020-02-02: qty 10

## 2020-02-02 MED ORDER — TETANUS-DIPHTH-ACELL PERTUSSIS 5-2.5-18.5 LF-MCG/0.5 IM SUSY: 0.5000 mL | PREFILLED_SYRINGE | Freq: Once | INTRAMUSCULAR | Status: DC

## 2020-02-02 MED ORDER — OXYTOCIN-SODIUM CHLORIDE 30-0.9 UT/500ML-% IV SOLN: 2.5000 [IU]/h | INTRAVENOUS | Status: AC

## 2020-02-02 MED ORDER — POVIDONE-IODINE 10 % EX SWAB
2.0000 "application " | Freq: Once | CUTANEOUS | Status: AC
  Administered 2020-02-02: 2 via TOPICAL

## 2020-02-02 MED ORDER — HYDROMORPHONE HCL 1 MG/ML IJ SOLN: 0.2500 mg | INTRAMUSCULAR | Status: DC | PRN

## 2020-02-02 MED ORDER — DEXAMETHASONE SODIUM PHOSPHATE 10 MG/ML IJ SOLN
INTRAMUSCULAR | Status: DC | PRN
  Administered 2020-02-02: 10 mg via INTRAVENOUS

## 2020-02-02 MED ORDER — SCOPOLAMINE 1 MG/3DAYS TD PT72
MEDICATED_PATCH | TRANSDERMAL | Status: AC
  Filled 2020-02-02: qty 1

## 2020-02-02 MED ORDER — KETOROLAC TROMETHAMINE 30 MG/ML IJ SOLN
INTRAMUSCULAR | Status: AC
  Filled 2020-02-02: qty 1

## 2020-02-02 MED ORDER — METHYLERGONOVINE MALEATE 0.2 MG PO TABS: 0.2000 mg | ORAL_TABLET | ORAL | Status: DC | PRN

## 2020-02-02 MED ORDER — SENNOSIDES-DOCUSATE SODIUM 8.6-50 MG PO TABS
2.0000 | ORAL_TABLET | Freq: Every day | ORAL | Status: DC
  Administered 2020-02-03 – 2020-02-04 (×2): 2 via ORAL
  Filled 2020-02-02 (×2): qty 2

## 2020-02-02 MED ORDER — PHENYLEPHRINE HCL-NACL 20-0.9 MG/250ML-% IV SOLN
INTRAVENOUS | Status: DC | PRN
  Administered 2020-02-02: 45 ug/min via INTRAVENOUS

## 2020-02-02 SURGICAL SUPPLY — 34 items
BENZOIN TINCTURE PRP APPL 2/3 (GAUZE/BANDAGES/DRESSINGS) ×3 IMPLANT
CHLORAPREP W/TINT 26ML (MISCELLANEOUS) ×3 IMPLANT
CLAMP CORD UMBIL (MISCELLANEOUS) IMPLANT
CLOTH BEACON ORANGE TIMEOUT ST (SAFETY) ×3 IMPLANT
DRSG OPSITE POSTOP 4X10 (GAUZE/BANDAGES/DRESSINGS) ×3 IMPLANT
ELECT REM PT RETURN 9FT ADLT (ELECTROSURGICAL) ×3
ELECTRODE REM PT RTRN 9FT ADLT (ELECTROSURGICAL) ×2 IMPLANT
EXTRACTOR VACUUM M CUP 4 TUBE (SUCTIONS) IMPLANT
GLOVE BIO SURGEON STRL SZ7.5 (GLOVE) ×3 IMPLANT
GLOVE BIOGEL PI IND STRL 7.0 (GLOVE) ×2 IMPLANT
GLOVE BIOGEL PI INDICATOR 7.0 (GLOVE) ×1
GOWN STRL REUS W/TWL LRG LVL3 (GOWN DISPOSABLE) ×6 IMPLANT
KIT ABG SYR 3ML LUER SLIP (SYRINGE) IMPLANT
NEEDLE HYPO 22GX1.5 SAFETY (NEEDLE) ×3 IMPLANT
NEEDLE HYPO 25X5/8 SAFETYGLIDE (NEEDLE) IMPLANT
NEEDLE SPNL 20GX3.5 QUINCKE YW (NEEDLE) IMPLANT
NS IRRIG 1000ML POUR BTL (IV SOLUTION) ×3 IMPLANT
PACK C SECTION WH (CUSTOM PROCEDURE TRAY) ×3 IMPLANT
PENCIL SMOKE EVAC W/HOLSTER (ELECTROSURGICAL) ×3 IMPLANT
STRIP SURGICAL 1/2 X 6 IN (GAUZE/BANDAGES/DRESSINGS) ×3 IMPLANT
SUT MNCRL 0 VIOLET CTX 36 (SUTURE) ×4 IMPLANT
SUT MNCRL AB 3-0 PS2 27 (SUTURE) IMPLANT
SUT MON AB 2-0 CT1 27 (SUTURE) ×3 IMPLANT
SUT MON AB-0 CT1 36 (SUTURE) ×6 IMPLANT
SUT MONOCRYL 0 CTX 36 (SUTURE) ×6
SUT PLAIN 0 NONE (SUTURE) IMPLANT
SUT PLAIN 2 0 (SUTURE)
SUT PLAIN 2 0 XLH (SUTURE) IMPLANT
SUT PLAIN ABS 2-0 CT1 27XMFL (SUTURE) IMPLANT
SYR 20CC LL (SYRINGE) IMPLANT
SYR CONTROL 10ML LL (SYRINGE) ×3 IMPLANT
TOWEL OR 17X24 6PK STRL BLUE (TOWEL DISPOSABLE) ×3 IMPLANT
TRAY FOLEY W/BAG SLVR 14FR LF (SET/KITS/TRAYS/PACK) ×3 IMPLANT
WATER STERILE IRR 1000ML POUR (IV SOLUTION) ×3 IMPLANT

## 2020-02-02 NOTE — Anesthesia Postprocedure Evaluation (Signed)
Anesthesia Post Note  Patient: Melissa Torres  Procedure(s) Performed: CESAREAN SECTION WITH BILATERAL TUBAL LIGATION (Bilateral )     Patient location during evaluation: PACU Anesthesia Type: Spinal Level of consciousness: oriented and awake and alert Pain management: pain level controlled Vital Signs Assessment: post-procedure vital signs reviewed and stable Respiratory status: spontaneous breathing, respiratory function stable and patient connected to nasal cannula oxygen Cardiovascular status: blood pressure returned to baseline and stable Postop Assessment: no headache, no backache and no apparent nausea or vomiting Anesthetic complications: no   No complications documented.  Last Vitals:  Vitals:   02/02/20 1115 02/02/20 1134  BP: 117/63 (!) 116/55  Pulse: 82 82  Resp: 16 16  Temp: 36.7 C 36.7 C  SpO2: 99% 99%    Last Pain:  Vitals:   02/02/20 1134  TempSrc: Oral  PainSc: 3    Pain Goal:                Epidural/Spinal Function Cutaneous sensation: Tingles (02/02/20 1134), Patient able to flex knees: Yes (02/02/20 1134), Patient able to lift hips off bed: Yes (02/02/20 1134), Back pain beyond tenderness at insertion site: No (02/02/20 1134), Progressively worsening motor and/or sensory loss: No (02/02/20 1134), Bowel and/or bladder incontinence post epidural: No (02/02/20 1134)  Lisvet Rasheed L Shenetta Schnackenberg

## 2020-02-02 NOTE — Anesthesia Preprocedure Evaluation (Addendum)
Anesthesia Evaluation  Patient identified by MRN, date of birth, ID band Patient awake    Reviewed: Allergy & Precautions, NPO status , Patient's Chart, lab work & pertinent test results  Airway Mallampati: II  TM Distance: >3 FB Neck ROM: Full    Dental no notable dental hx.    Pulmonary neg pulmonary ROS,    Pulmonary exam normal breath sounds clear to auscultation       Cardiovascular negative cardio ROS Normal cardiovascular exam Rhythm:Regular Rate:Normal     Neuro/Psych negative neurological ROS  negative psych ROS   GI/Hepatic negative GI ROS, Neg liver ROS,   Endo/Other  Hypothyroidism   Renal/GU negative Renal ROS  negative genitourinary   Musculoskeletal negative musculoskeletal ROS (+)   Abdominal (+) + obese,   Peds  Hematology negative hematology ROS (+)   Anesthesia Other Findings Repeat C/S  Reproductive/Obstetrics (+) Pregnancy                            Anesthesia Physical Anesthesia Plan  ASA: II  Anesthesia Plan: Spinal   Post-op Pain Management:    Induction:   PONV Risk Score and Plan: 3 and Ondansetron, Dexamethasone and Scopolamine patch - Pre-op  Airway Management Planned: Natural Airway and Nasal Cannula  Additional Equipment:   Intra-op Plan:   Post-operative Plan:   Informed Consent: I have reviewed the patients History and Physical, chart, labs and discussed the procedure including the risks, benefits and alternatives for the proposed anesthesia with the patient or authorized representative who has indicated his/her understanding and acceptance.     Dental advisory given  Plan Discussed with: CRNA  Anesthesia Plan Comments:        Anesthesia Quick Evaluation

## 2020-02-02 NOTE — H&P (Signed)
Melissa Torres is a 35 y.o. female presenting for rpt csection and possible TL. OB History    Gravida  2   Para  1   Term  1   Preterm      AB      Living  1     SAB      IAB      Ectopic      Multiple  0   Live Births  1          Past Medical History:  Diagnosis Date  . Hypoglycemia   . Hypothyroidism   . Internal derangement of right knee   . Newborn product of in vitro fertilization (IVF) pregnancy   . Unicornate uterus affecting pregnancy    Past Surgical History:  Procedure Laterality Date  . CESAREAN SECTION N/A 12/25/2017   Procedure: Primary CESAREAN SECTION;  Surgeon: Maxie Better, MD;  Location: [facility];  Service: Obstetrics;  Laterality: N/A;  EDD: 01/01/18 Allergy: IVP Dye, Flu Vaccine Karmen Stabs, RNFA  Cline Crock, CST Marrion Coy, RN Babs Bertin, CRNA  . egg retrieval    . KNEE ARTHROSCOPY Right 11/07/2012   Procedure: RIGHT KNEE ARTHROSCOPY PLICA RESECTION DEBRIDEMENT AND CHONDROPLASTY ;  Surgeon: Eugenia Mcalpine, MD;  Location: [facility];  Service: Orthopedics;  Laterality: Right;  . TONSILLECTOMY AND ADENOIDECTOMY  AGE 49   Family History: family history includes Atrial fibrillation in her mother; Crohn's disease in her sister; Diabetes in her maternal grandfather and mother; Heart failure in her maternal grandmother; Hypertension in her maternal grandmother and mother; Prostate cancer in her maternal grandfather. Social History:  reports that she has never smoked. She has never used smokeless tobacco. She reports previous alcohol use. She reports that she does not use drugs.     Maternal Diabetes: No Genetic Screening: Normal Maternal Ultrasounds/Referrals: Normal Fetal Ultrasounds or other Referrals:  None Maternal Substance Abuse:  No Significant Maternal Medications:  None Significant Maternal Lab Results:  Group B Strep negative Other Comments:  None  Review of Systems  Constitutional:  Negative.   All other systems reviewed and are negative.  Maternal Medical History:  Reason for admission: Contractions.   Contractions: Frequency: rare.   Perceived severity is mild.    Fetal activity: Perceived fetal activity is normal.   Last perceived fetal movement was within the past hour.    Prenatal complications: no prenatal complications Prenatal Complications - Diabetes: none.      Last menstrual period 03/08/2019, not currently breastfeeding. Maternal Exam:  Uterine Assessment: Contraction strength is mild.  Contraction frequency is rare.   Abdomen: Patient reports no abdominal tenderness. Surgical scars: low transverse.   Fetal presentation: vertex  Introitus: Normal vulva. Normal vagina.  Ferning test: not done.  Nitrazine test: not done. Amniotic fluid character: not assessed.  Pelvis: questionable for delivery.   Cervix: Cervix evaluated by digital exam.     Physical Exam Vitals and nursing note reviewed.  Constitutional:      Appearance: Normal appearance.  HENT:     Head: Normocephalic and atraumatic.  Cardiovascular:     Rate and Rhythm: Normal rate and regular rhythm.     Pulses: Normal pulses.     Heart sounds: Normal heart sounds.  Pulmonary:     Effort: Pulmonary effort is normal.     Breath sounds: Normal breath sounds.  Abdominal:     Palpations: Abdomen is soft.  Genitourinary:    General: Normal vulva.  Musculoskeletal:  General: Normal range of motion.     Cervical back: Normal range of motion and neck supple.  Skin:    General: Skin is warm and dry.  Neurological:     General: No focal deficit present.     Mental Status: She is alert and oriented to person, place, and time.  Psychiatric:        Mood and Affect: Mood normal.        Behavior: Behavior normal.     CBC    Component Value Date/Time   WBC 9.6 01/31/2020 0942   RBC 4.31 01/31/2020 0942   HGB 12.2 01/31/2020 0942   HGB 13.2 03/15/2019 0911   HCT 38.0  01/31/2020 0942   HCT 38.6 03/15/2019 0911   PLT 246 01/31/2020 0942   PLT 292 03/15/2019 0911   MCV 88.2 01/31/2020 0942   MCV 85 03/15/2019 0911   MCH 28.3 01/31/2020 0942   MCHC 32.1 01/31/2020 0942   RDW 15.9 (H) 01/31/2020 0942   RDW 13.0 03/15/2019 0911   LYMPHSABS 2.4 03/15/2019 0911   MONOABS 497 03/18/2016 1020   EOSABS 0.7 (H) 03/15/2019 0911   BASOSABS 0.1 03/15/2019 0911    Prenatal labs: ABO, Rh: --/--/O POS (01/05 7062) Antibody: NEG (01/05 0949) Rubella:  imm RPR: NON REACTIVE (01/05 0942)  HBsAg:   neg HIV:   neg GBS:   neg  Assessment/Plan: 39 wk IUP Previous csection Uterine anomaly Rpt csection and TL Surgical risks discussed. Consent done.   Cort Dragoo J 02/02/2020, 6:32 AM

## 2020-02-02 NOTE — Anesthesia Procedure Notes (Signed)
Spinal  Patient location during procedure: OR Start time: 02/02/2020 9:12 AM End time: 02/02/2020 9:14 AM Staffing Performed: anesthesiologist  Anesthesiologist: Leilani Able, MD Preanesthetic Checklist Completed: patient identified, IV checked, site marked, risks and benefits discussed, surgical consent, monitors and equipment checked, pre-op evaluation and timeout performed Spinal Block Patient position: sitting Prep: DuraPrep and site prepped and draped Patient monitoring: continuous pulse ox and blood pressure Approach: midline Location: L3-4 Injection technique: single-shot Needle Needle type: Pencan  Needle gauge: 24 G Needle length: 10 cm Needle insertion depth: 6 cm Assessment Sensory level: T4

## 2020-02-02 NOTE — Transfer of Care (Signed)
Immediate Anesthesia Transfer of Care Note  Patient: Melissa Torres  Procedure(s) Performed: CESAREAN SECTION WITH BILATERAL TUBAL LIGATION (Bilateral )  Patient Location: PACU  Anesthesia Type:Spinal  Level of Consciousness: awake, alert , oriented and patient cooperative  Airway & Oxygen Therapy: Patient Spontanous Breathing  Post-op Assessment: Report given to RN and Post -op Vital signs reviewed and stable  Post vital signs: Reviewed and stable  Last Vitals:  Vitals Value Taken Time  BP 126/72 02/02/20 1027  Temp    Pulse 80 02/02/20 1028  Resp    SpO2 100 % 02/02/20 1028  Vitals shown include unvalidated device data.  Last Pain:  Vitals:   02/02/20 0753  TempSrc: Oral         Complications: No complications documented.

## 2020-02-02 NOTE — Op Note (Signed)
Cesarean Section Procedure Note  Indications: previous uterine incision kerr x one  Pre-operative Diagnosis: 39 week 0 day pregnancy.  Post-operative Diagnosis: same  Surgeon: Lenoard Aden   Assistants: Georges Lynch, RNFA  Anesthesia: Local anesthesia 0.25.% bupivacaine and Spinal anesthesia  ASA Class: 2  Procedure Details  The patient was seen in the Holding Room. The risks, benefits, complications, treatment options, and expected outcomes were discussed with the patient.  The patient concurred with the proposed plan, giving informed consent. The risks of anesthesia, infection, bleeding and possible injury to other organs discussed. Injury to bowel, bladder, or ureter with possible need for repair discussed. Possible need for transfusion with secondary risks of hepatitis or HIV acquisition discussed. Post operative complications to include but not limited to DVT, PE and Pneumonia noted. The site of surgery properly noted/marked. The patient was taken to Operating Room # B, identified as Melissa Torres and the procedure verified as C-Section Delivery. A Time Out was held and the above information confirmed.  After induction of anesthesia, the patient was draped and prepped in the usual sterile manner. A Pfannenstiel incision was made and carried down through the subcutaneous tissue to the fascia. Fascial incision was made and extended transversely using Mayo scissors. The fascia was separated from the underlying rectus tissue superiorly and inferiorly. The peritoneum was identified and entered. Peritoneal incision was extended longitudinally. The utero-vesical peritoneal reflection was incised transversely and the bladder flap was bluntly freed from the lower uterine segment. A low transverse uterine incision(Kerr hysterotomy) was made. Delivered from OA presentation was a  female with Apgar scores of 9 at one minute and 9 at five minutes. Bulb suctioning gently performed. Neonatal team  in attendance.After the umbilical cord was clamped and cut cord blood was obtained for evaluation. The placenta was removed intact and appeared normal. The uterus was curetted with a dry lap pack. Good hemostasis was noted.The uterine outline, tubes and ovaries appeared abnormal with right rudimentary horn.NL tubes and ovaries. Bilateral Modified [place] tubal ligation without complications. Tubal segments identified and sent to pathology. The uterine incision was closed with running locked sutures of 0 Monocryl x 2 layers. Hemostasis was observed. Lavage was carried out until clear.The parietal peritoneum was closed with a running 2-0 Monocryl suture. The fascia was then reapproximated with running sutures of 0 Monocryl. The skin was reapproximated with 3-0 monocryl after [place] closure with 2-0 plain.  Instrument, sponge, and needle counts were correct prior the abdominal closure and at the conclusion of the case.   Findings: FTLF, OA, post placent  Estimated Blood Loss:  300 mL         Drains: foley                 Specimens: placenta and tubal segments to pathology                 Complications:  None; patient tolerated the procedure well.         Disposition: PACU - hemodynamically stable.         Condition: stable  Attending Attestation: I performed the procedure.

## 2020-02-02 NOTE — Progress Notes (Signed)
Patient moaning in pain with slight movement, cannot receive meds yet per [facility]. Called to notify CNM, currently in the OR with MD and will evaluate when done.

## 2020-02-03 LAB — CBC
HCT: 30.5 % — ABNORMAL LOW (ref 36.0–46.0)
Hemoglobin: 10.1 g/dL — ABNORMAL LOW (ref 12.0–15.0)
MCH: 29.5 pg (ref 26.0–34.0)
MCHC: 33.1 g/dL (ref 30.0–36.0)
MCV: 89.2 fL (ref 80.0–100.0)
Platelets: 205 10*3/uL (ref 150–400)
RBC: 3.42 MIL/uL — ABNORMAL LOW (ref 3.87–5.11)
RDW: 15.8 % — ABNORMAL HIGH (ref 11.5–15.5)
WBC: 11.8 10*3/uL — ABNORMAL HIGH (ref 4.0–10.5)
nRBC: 0 % (ref 0.0–0.2)

## 2020-02-03 NOTE — Lactation Note (Signed)
This note was copied from a baby's chart. Lactation Consultation Note  Patient Name: Melissa Torres [id number] Date: 02/03/2020 Reason for consult: Initial assessment Age:35 hours  P2, Mother reports that she had a low milk supply with her first child. She reports that she tried to pump and supplement for 3 months.  She reports that she has a history of PCOS. Mother reports that she did have some breast changes during pregnancy.  Mother is a Producer, television/film/video. She purchased a Elvie Stride through [employer]. She reports that she feels that this is a good pump for her . She want to start using her own pump. If she feels it to be necessary she will use the Symphony .   Infant has had 6 feedings between 15 -25 mins.in 25 hours. Mother reports that she is not sore and thinks these are good feedings. Latch scores 8-9. Advised mother to page staff nurse or LC to see feeding.  Mother reports that she will feed infant on cue . Advised that infant should began to cluster feed soon.   Hand expression reviewed .  Mother to start pumping after each feeding . At least every 3 hours for 15 mins.   Mother receptive to all teaching.   Maternal Data Has patient been taught Hand Expression?: Yes Does the patient have breastfeeding experience prior to this delivery?: Yes  Feeding Feeding Type: Breast Fed  LATCH Score                   Interventions Interventions: Breast feeding basics reviewed  Lactation Tools Discussed/Used Pump Education: Setup, frequency, and cleaning;Milk Storage Date initiated:: 02/03/20   Consult Status Consult Status: Follow-up Date: 02/04/20 Follow-up type: In-patient    Stevan Born [facility] 02/03/2020, 11:56 AM

## 2020-02-03 NOTE — Progress Notes (Signed)
POD#1 status post repeat cesarean section with bilateral tubal ligation  S: Pt notes pain overall controlled w/ po meds though does have focal left lower quadrant pain.  Pain started yesterday and was evaluated.  Patient notes after ice to the left lower quadrant, 500 cc bolus which cleared her concentrated urine and repositioning of the Foley bulb that left lower quadrant pain went away.  Foley was removed late last night.  She has not had that pain until just over the last 30 minutes.  Of note her urine is concentrated again and patient is just asking for her first narcotic medication. Minimal lochia, nl void, out of bed w/o dizziness or chest pain, tol reg po, no flatus. Pt is  breastfeeding  Vitals:   02/02/20 1233 02/02/20 1352 02/02/20 1428 02/02/20 1817  BP: 119/72 100/66 101/65 104/68  Pulse: 80 63 73 71  Resp: 18 18 18    Temp: 98.1 F (36.7 C) (!) 97.5 F (36.4 C)  98.3 F (36.8 C)  TempSrc: Oral Axillary  Oral  SpO2: 97% 97% 98% 98%  Weight:      Height:        Gen: well appearing CV: RRR Pulm: CTAB Abd: soft, ND, approp tender, fundus below umbilicus, NT.  No costovertebral angle tenderness Inc: C/D/I,  LE: tr edema, NT  CBC    Component Value Date/Time   WBC 11.8 (H) 02/03/2020 0558   RBC 3.42 (L) 02/03/2020 0558   HGB 10.1 (L) 02/03/2020 0558   HGB 13.2 03/15/2019 0911   HCT 30.5 (L) 02/03/2020 0558   HCT 38.6 03/15/2019 0911   PLT 205 02/03/2020 0558   PLT 292 03/15/2019 0911   MCV 89.2 02/03/2020 0558   MCV 85 03/15/2019 0911   MCH 29.5 02/03/2020 0558   MCHC 33.1 02/03/2020 0558   RDW 15.8 (H) 02/03/2020 0558   RDW 13.0 03/15/2019 0911   LYMPHSABS 2.4 03/15/2019 0911   MONOABS 497 03/18/2016 1020   EOSABS 0.7 (H) 03/15/2019 0911   BASOSABS 0.1 03/15/2019 0911    A/P: POD#1 s/p repeat cesarean section with tubal ligation - post-op. Doing well.  Oral pain medicines.  Continue to watch focal left lower quadrant pain.  No worrisome features on exam and  suspect this is due to adhesiolysis. -Mild anemia.  Plan iron at discharge once patient having regular bowel movements.  Patient has iron at home.  03/17/2019 02/03/2020 12:27 PM

## 2020-02-04 ENCOUNTER — Other Ambulatory Visit (HOSPITAL_COMMUNITY): Payer: Self-pay | Admitting: Obstetrics

## 2020-02-04 MED ORDER — OXYCODONE-ACETAMINOPHEN 5-325 MG PO TABS: 1.0000 | ORAL_TABLET | ORAL | 0 refills | Status: DC | PRN

## 2020-02-04 MED ORDER — IBUPROFEN 800 MG PO TABS: 800.0000 mg | ORAL_TABLET | Freq: Four times a day (QID) | ORAL | 0 refills | Status: DC

## 2020-02-04 NOTE — Lactation Note (Signed)
This note was copied from a baby's chart. Lactation Consultation Note  Patient Name: Melissa Torres [id number] Date: 02/04/2020 Reason for consult: Follow-up assessment Age:35 hours   Mother reports that her nipples are sore . She has been breastfeeding every other feeding and bottle feeding with Nutramigen .  Mother reports that she tried her pump once and the flange is to large and she has to order a smaller size. Mother preferred not to pump while she was her.   Mother has a large crack and positional strip on the rt nipple. She was given comfort gels. She was also advised to get RX for APNO.  Assist mother with latching infant and taught to flange infants upper and lower lips with chin tug.  Mother reports that she doesn't fell any pain when this is done.  Husband reports that he will be there to assist her.   Infant sustained latch for 30 mins. Observed audible swallows and consistent suckling pattern. Reviewed hand expression again.  Discussed that infant has a tight upper lip tie and a posterior tongue tie. Mother was given a hand out for Dental Specialist.  Discussed treatment and prevention of engorgement.   Plan of Care : Breastfeed infant with feeding cues Supplement infant with ebm/formula, according to supplemental guidelines. Pump using a DEBP after each feeding for 15-20 mins.   Mother to continue to cue base feed infant and feed at least 8-12 times or more in 24 hours and advised to allow for cluster feeding infant as needed.  Mother to continue to due STS. Mother is aware of available LC services at [facility], BFSG'S, OP Dept, and phone # for questions or concerns about breastfeeding.  Mother receptive to all teaching and plan of care.   Maternal Data    Feeding Feeding Type: Breast Fed  LATCH Score Latch: Grasps breast easily, tongue down, lips flanged, rhythmical sucking.  Audible Swallowing: Spontaneous and intermittent  Type of Nipple: Everted at rest and  after stimulation  Comfort (Breast/Nipple): Filling, red/small blisters or bruises, mild/mod discomfort  Hold (Positioning): Assistance needed to correctly position infant at breast and maintain latch.  LATCH Score: 8  Interventions Interventions: Breast compression;Adjust position;Support pillows;Position options;DEBP;Assisted with latch;Skin to skin;Hand express  Lactation Tools Discussed/Used     Consult Status Consult Status: Complete    Michel Bickers 02/04/2020, 11:56 AM

## 2020-02-04 NOTE — Discharge Summary (Signed)
OB Discharge Summary  Patient Name: Melissa Torres DOB: 09-08-1985 MRN: [id number]  Date of admission: 02/02/2020 Delivering MD: Olivia Mackie   Date of discharge: 02/04/2020  Admitting diagnosis: Previous cesarean section [Z98.891] Previous cesarean delivery affecting pregnancy [O34.219] Intrauterine pregnancy: [redacted]w[redacted]d     Secondary diagnosis:Principal Problem:   Postpartum care following cesarean delivery (1/7) Active Problems:   Previous cesarean section   Previous cesarean delivery affecting pregnancy   S/P bilateral tubal ligation  Additional problems:none     Discharge diagnosis: Term Pregnancy Delivered                                                                     Post partum procedures:none  Augmentation: N/A  Complications: None  Hospital course:  Sceduled C/S   35 y.o. yo G2P2002 at [redacted]w[redacted]d was admitted to the hospital 02/02/2020 for scheduled cesarean section with the following indication:Elective Repeat.Delivery details are as follows:  Membrane Rupture Time/Date: 9:38 AM ,02/02/2020   Delivery Method:C-Section, Low Transverse  Details of operation can be found in separate operative note.  Patient had an uncomplicated postpartum course.  She is ambulating, tolerating a regular diet, passing flatus, and urinating well. Patient is discharged home in stable condition on  02/04/20        Newborn Data: Birth date:02/02/2020  Birth time:9:38 AM  Gender:Female  Living status:Living  Apgars:9 ,9  Weight:3920 g     On POD 2 pt was ready for d/c. She was ambulating, voiding, tolerating regular po and passing flatus. She was both breast and bottle feeding. RLQ pain has resolved, some new discomfort in LLQ. Pain meds help to control pain.   Physical exam  Vitals:   02/03/20 1223 02/03/20 1500 02/03/20 2137 02/04/20 0646  BP: 116/60 118/63 133/74 116/60  Pulse: 68 67 64 60  Resp: 16 18 16 20   Temp: 98.1 F (36.7 C) 98.1 F (36.7 C) 98.4 F (36.9 C) 98.4 F  (36.9 C)  TempSrc: Oral Oral  Oral  SpO2: 100%  100% 100%  Weight:      Height:       General: alert, cooperative and no distress Lochia: appropriate Uterine Fundus: firm Incision: Healing well with no significant drainage DVT Evaluation: No evidence of DVT seen on physical exam. Labs: Lab Results  Component Value Date   WBC 11.8 (H) 02/03/2020   HGB 10.1 (L) 02/03/2020   HCT 30.5 (L) 02/03/2020   MCV 89.2 02/03/2020   PLT 205 02/03/2020   CMP Latest Ref Rng & Units 03/15/2019  Glucose 65 - 99 mg/dL 82  BUN 6 - 20 mg/dL 10  Creatinine 03/17/2019 - 0.38 mg/dL 8.82  Sodium 8.00 - 349 mmol/L 141  Potassium 3.5 - 5.2 mmol/L 4.9  Chloride 96 - 106 mmol/L 103  CO2 20 - 29 mmol/L 25  Calcium 8.7 - 10.2 mg/dL 9.6  Total Protein 6.0 - 8.5 g/dL 7.0  Total Bilirubin 0.0 - 1.2 mg/dL 0.2  Alkaline Phos 39 - 117 IU/L 87  AST 0 - 40 IU/L 11  ALT 0 - 32 IU/L 14    Discharge instruction: per After Visit Summary and "Baby and Me Booklet".  After Visit Meds:  Allergies as of 02/04/2020  Reactions   Ivp Dye [iodinated Diagnostic Agents] Hives, Itching   Influenza Vaccines Other (See Comments)   Painful pustules in mouth and throat   Other    Steroids- hallucinations, anxiety CHG wipes/solutions--flares up eczema/itching   Latex Itching, Rash   SEVERE ITCHING      Medication List    STOP taking these medications   betamethasone valerate 0.1 % cream Commonly known as: VALISONE     TAKE these medications   Ferrex 150 150 MG capsule Generic drug: iron polysaccharides Take 150 mg by mouth daily.   ibuprofen 800 MG tablet Commonly known as: ADVIL Take 1 tablet (800 mg total) by mouth every 6 (six) hours.   levothyroxine 50 MCG tablet Commonly known as: SYNTHROID TAKE 1 TABLET BY MOUTH DAILY. What changed: when to take this   oxyCODONE-acetaminophen 5-325 MG tablet Commonly known as: PERCOCET/ROXICET Take 1-2 tablets by mouth every 4 (four) hours as needed for moderate  pain.   Prenatal Vitamin 27-0.8 MG Tabs Take 1 tablet by mouth daily at 12 noon.       Diet: routine diet  Activity: Advance as tolerated. Pelvic rest for 6 weeks.   Outpatient follow up:6 weeks Follow up Appt: Future Appointments  Date Time Provider Department Center  03/18/2020  8:30 AM Avanell Shackleton, NP-C [facility]   Follow up visit: No follow-ups on file.  Postpartum contraception: Not Discussed  Newborn Data: Live born female  Birth Weight: 8 lb 10.3 oz (3920 g) APGAR: 9, 9  Newborn Delivery   Birth date/time: 02/02/2020 09:38:00 Delivery type: C-Section, Low Transverse Trial of labor: No C-section categorization: Repeat      Baby Feeding: Bottle and Breast Disposition:home with mother   02/04/2020 Lendon Colonel, MD

## 2020-02-05 LAB — SURGICAL PATHOLOGY

## 2020-02-05 MED FILL — IBUPROFEN 800 MG TAB: 800 | 7 days supply | Qty: 30 | Fill #0

## 2020-02-09 ENCOUNTER — Other Ambulatory Visit (HOSPITAL_COMMUNITY): Payer: Self-pay | Admitting: Obstetrics and Gynecology

## 2020-02-09 MED FILL — NITROFURANTOIN MONO-MCR 100: 100 | 5 days supply | Qty: 10 | Fill #0

## 2020-03-18 ENCOUNTER — Encounter: Payer: 59 | Admitting: Family Medicine

## 2020-03-19 ENCOUNTER — Ambulatory Visit (INDEPENDENT_AMBULATORY_CARE_PROVIDER_SITE_OTHER): Payer: 59 | Admitting: Podiatry

## 2020-03-19 ENCOUNTER — Other Ambulatory Visit: Payer: Self-pay

## 2020-03-19 ENCOUNTER — Ambulatory Visit (INDEPENDENT_AMBULATORY_CARE_PROVIDER_SITE_OTHER): Payer: 59

## 2020-03-19 ENCOUNTER — Encounter: Payer: Self-pay | Admitting: Podiatry

## 2020-03-19 DIAGNOSIS — S92514A Nondisplaced fracture of proximal phalanx of right lesser toe(s), initial encounter for closed fracture: Secondary | ICD-10-CM | POA: Diagnosis not present

## 2020-03-19 DIAGNOSIS — M7751 Other enthesopathy of right foot: Secondary | ICD-10-CM | POA: Diagnosis not present

## 2020-03-19 NOTE — Progress Notes (Signed)
Subjective:  Patient ID: Melissa Torres, female    DOB: 1985-11-14,  MRN: [id number] HPI Chief Complaint  Patient presents with  . Toe Pain    3rd toe right - severe pain and redness x 1.5 days, no injury, redness come and goes in severity, soaked last night in epsom  . New Patient (Initial Visit)    35 y.o. female presents with the above complaint.   ROS: Denies fever chills nausea vomiting muscle aches pains calf pain back pain chest pain shortness of breath.  Past Medical History:  Diagnosis Date  . Hypoglycemia   . Hypothyroidism   . Internal derangement of right knee   . Newborn product of in vitro fertilization (IVF) pregnancy   . Unicornate uterus affecting pregnancy    Past Surgical History:  Procedure Laterality Date  . CESAREAN SECTION N/A 12/25/2017   Procedure: Primary CESAREAN SECTION;  Surgeon: Servando Salina, MD;  Location: [place];  Service: Obstetrics;  Laterality: N/A;  EDD: 01/01/18 Allergy: IVP Dye, Flu Vaccine Gaylord Shih, RNFA  Waynetta Pean, CST Manon Hilding, RN Vonna Kotyk, CRNA  . CESAREAN SECTION WITH BILATERAL TUBAL LIGATION Bilateral 02/02/2020   Procedure: CESAREAN SECTION WITH BILATERAL TUBAL LIGATION;  Surgeon: Brien Few, MD;  Location: [place] [facility];  Service: Obstetrics;  Laterality: Bilateral;  . egg retrieval    . KNEE ARTHROSCOPY Right 11/07/2012   Procedure: RIGHT KNEE ARTHROSCOPY PLICA RESECTION DEBRIDEMENT AND CHONDROPLASTY ;  Surgeon: Sydnee Cabal, MD;  Location: [place];  Service: Orthopedics;  Laterality: Right;  . TONSILLECTOMY AND ADENOIDECTOMY  AGE 28    Current Outpatient Medications:  .  levothyroxine (SYNTHROID) 50 MCG tablet, TAKE 1 TABLET BY MOUTH DAILY. (Patient taking differently: Take 50 mcg by mouth daily before breakfast.), Disp: 90 tablet, Rfl: 1  Allergies  Allergen Reactions  . Ivp Dye [Iodinated Diagnostic Agents] Hives and Itching  . Influenza Vaccines Other (See  Comments)    Painful pustules in mouth and throat  . Other     Steroids- hallucinations, anxiety CHG wipes/solutions--flares up eczema/itching  . Latex Itching and Rash    SEVERE ITCHING   Review of Systems Objective:  There were no vitals filed for this visit.  General: Well developed, nourished, in no acute distress, alert and oriented x3   Dermatological: Skin is warm, dry and supple bilateral. Nails x 10 are well maintained; remaining integument appears unremarkable at this time. There are no open sores, no preulcerative lesions, no rash or signs of infection present.  Dyshidrosis.  Vascular: Dorsalis Pedis artery and Posterior Tibial artery pedal pulses are 2/4 bilateral with immedate capillary fill time. Pedal hair growth present. No varicosities and no lower extremity edema present bilateral.   Neruologic: Grossly intact via light touch bilateral. Vibratory intact via tuning fork bilateral. Protective threshold with Semmes Wienstein monofilament intact to all pedal sites bilateral. Patellar and Achilles deep tendon reflexes 2+ bilateral. No Babinski or clonus noted bilateral.   Musculoskeletal: No gross boney pedal deformities bilateral. No pain, crepitus, or limitation noted with foot and ankle range of motion bilateral. Muscular strength 5/5 in all groups tested bilateral.  She has mild erythema along the medial aspect of the PIPJ third digit of the right foot.  Moderately tender on palpation.  Gait: Unassisted, Nonantalgic.    Radiographs:  Radiographs demonstrate what appears to be a nondisplaced oblique fracture of the head of the proximal phalanx third toe right foot.  There is mild edema associated with it.  Does not appear to be intra-articular no fracture of the metatarsal.  Assessment & Plan:   Assessment: Possible fracture proximal phalanx third digit right foot.  Cannot rule out seropositive arthropathy.  Plan: Discussed appropriate shoe gear with her to keep the  toe from bending.  Discussed wrapping the toe.  I also recommended that I will follow-up with her in 3 weeks and if this does not improve considerably we will consider a blood draw for arthritis.  This would include an HLA-B27 as well as ANA and all of the other tests.     Melissa Torres T. [place], [place]

## 2020-03-27 DIAGNOSIS — Z124 Encounter for screening for malignant neoplasm of cervix: Secondary | ICD-10-CM | POA: Diagnosis not present

## 2020-03-27 DIAGNOSIS — O99285 Endocrine, nutritional and metabolic diseases complicating the puerperium: Secondary | ICD-10-CM | POA: Diagnosis not present

## 2020-04-09 ENCOUNTER — Ambulatory Visit: Payer: 59 | Admitting: Podiatry

## 2020-05-07 ENCOUNTER — Other Ambulatory Visit: Payer: Self-pay

## 2020-05-07 ENCOUNTER — Other Ambulatory Visit: Payer: Self-pay | Admitting: Family Medicine

## 2020-05-07 ENCOUNTER — Other Ambulatory Visit (HOSPITAL_COMMUNITY): Payer: Self-pay

## 2020-05-07 DIAGNOSIS — E039 Hypothyroidism, unspecified: Secondary | ICD-10-CM

## 2020-05-07 MED ORDER — LEVOTHYROXINE SODIUM 50 MCG PO TABS
ORAL_TABLET | Freq: Every day | ORAL | 0 refills | Status: DC
  Filled 2020-05-07: qty 90, 90d supply, fill #0

## 2020-05-07 NOTE — Telephone Encounter (Signed)
Looks like she needs a visit since I am not sure what she is using this for and I have not seen her since early 2021. Please check on this. Thanks.

## 2020-05-07 NOTE — Telephone Encounter (Signed)
Pt has an appt at the end of the month

## 2020-05-07 NOTE — Telephone Encounter (Signed)
Recv'd fax from [facility] pharmacy for refill Betamethasone Valerate 0.1

## 2020-05-08 ENCOUNTER — Other Ambulatory Visit (HOSPITAL_COMMUNITY): Payer: Self-pay

## 2020-05-08 MED ORDER — BETAMETHASONE VALERATE 0.1 % EX CREA
TOPICAL_CREAM | Freq: Two times a day (BID) | CUTANEOUS | 0 refills | Status: DC
  Filled 2020-05-08: qty 30, 15d supply, fill #0
  Filled 2020-05-30: qty 30, 30d supply, fill #0

## 2020-05-08 NOTE — Telephone Encounter (Signed)
This is for ezcema. She has an appointment next week for cpe

## 2020-05-09 ENCOUNTER — Other Ambulatory Visit (HOSPITAL_COMMUNITY): Payer: Self-pay

## 2020-05-10 ENCOUNTER — Other Ambulatory Visit (HOSPITAL_COMMUNITY): Payer: Self-pay

## 2020-05-20 NOTE — Progress Notes (Signed)
Subjective:    Patient ID: Melissa Torres, female    DOB: 17-Oct-1985, 35 y.o.   MRN: [id number]  HPI Chief Complaint  Patient presents with  . fasting cpe    Fasting cpe, hair falling out- not sure if hormones since pregnancy   She is here for a complete physical exam.  Other providers: OB/GYN- Dr. Billy Coast  Dentist- Dr. Yetta Barre   Delivered in January with her youngest child. C- section.  Is not currently breast feeding. Weight is down from pre-pregnancy.  Eating at home most days.    Reports a 3 week history of concerns due to intermittent bloating sensation, salty food cravings, and headaches that feel like when she is PMS. Recent evaluation by OB/GYN but not since these symptoms started. States she does not want to be on birth control (hormones).   States her hair is thinning and coming out.   Also reports an umbilical hernia that is new since her last pregnancy. She has discussed this with a general surgeon and is holding off for now.    Social history: Lives with husband and 2 children, works as a Engineer, civil (consulting) in the [place] Denies smoking, drinking alcohol, drug use  Diet: craving salt lately. She was eating fairly healthy  Excerise: walking and active job  Immunizations: declines Covid vaccine. Tdap UTD    Health maintenance:  Mammogram: N/A Colonoscopy: N/A Last Gynecological Exam: March 2022  Last Menstrual cycle: early March  Last Dental Exam: 2 years ago  Last Eye Exam: years ago   Wears seatbelt always, uses sunscreen, smoke detectors in home and functioning, does not text while driving and feels safe in home environment.   Reviewed allergies, medications, past medical, surgical, family, and social history.   Review of Systems Review of Systems Constitutional: -fever, -chills, +sweats, -unexpected weight change,-fatigue ENT: -runny nose, -ear pain, -sore throat Cardiology:  -chest pain, -palpitations, -edema Respiratory: -cough, -shortness of breath,  -wheezing Gastroenterology: +abdominal pain, -nausea, -vomiting, -diarrhea, -constipation  Hematology: -bleeding or bruising problems Musculoskeletal: -arthralgias, -myalgias, -joint swelling, -back pain Ophthalmology: -vision changes Urology: -dysuria, -difficulty urinating, -hematuria, -urinary frequency, -urgency Neurology: -headache, -weakness, -tingling, -numbness       Objective:   Physical Exam BP 120/80   Pulse 81   Ht 5' 7.5" (1.715 m)   Wt 223 lb 6.4 oz (101.3 kg)   Breastfeeding No   BMI 34.47 kg/m   General Appearance:    Alert, cooperative, no distress, appears stated age  Head:    Normocephalic, without obvious abnormality, atraumatic  Eyes:    PERRL, conjunctiva/corneas clear, EOM's intact  Ears:    Normal TM's and external ear canals  Nose:   Mask on   Throat:   Mask on   Neck:   Supple, no lymphadenopathy;  thyroid:  no   enlargement/tenderness/nodules; no JVD  Back:    Spine nontender, no curvature, ROM normal, no CVA     tenderness  Lungs:     Clear to auscultation bilaterally without wheezes, rales or     ronchi; respirations unlabored  Chest Wall:    No tenderness or deformity   Heart:    Regular rate and rhythm, S1 and S2 normal, no murmur, rub   or gallop  Breast Exam:    OB/GYN   No axillary lymphadenopathy  Abdomen:     Soft, non-tender, nondistended, normoactive bowel sounds,    no masses, no hepatosplenomegaly. Umbilical hernia present without TTP or strangulation  Genitalia:  OB/GYN  Rectal:    Not performed due to age<40 and no related complaints  Extremities:   No clubbing, cyanosis or edema  Pulses:   2+ and symmetric all extremities  Skin:   Skin color, texture, turgor normal, no rashes or lesions  Lymph nodes:   Cervical, supraclavicular, and axillary nodes normal  Neurologic:   CNII-XII intact, normal strength, sensation and gait          Psych:   Normal mood, affect, hygiene and grooming.         Assessment & Plan:  Routine general  medical examination at a health care facility - Plan: Comprehensive metabolic panel, TSH, CBC with Differential/Platelet -She is here for fasting CPE.  Preventive health care reviewed.  She does see Dr. Billy Coast her OB/GYN.  Counseling on healthy lifestyle including diet and exercise.  Recommend regular dental and eye exams.  Immunizations reviewed.  Discussed safety and health promotion.  Bloating -Discussed cutting back on foods that are high gas producers and trying Gas-[employer].  She will follow-up with her OB/GYN due to PMS symptom complaint.  Hypothyroidism, unspecified type - Plan: TSH -Check thyroid function and follow-up.  Continue current dose of levothyroxine  Elevated LDL cholesterol level - Plan: Lipid panel -Recommend low-fat, low-cholesterol diet and increasing physical activity.  Follow-up pending lipid panel results  Family history of diabetes mellitus in mother - Plan: Hemoglobin A1c -She is aware that this increases her risk of developing diabetes at some point.  Recommend healthy diet and getting plenty of physical activity.  Obesity (BMI 30.0-34.9) - Plan: Lipid panel, Hemoglobin A1c -Her weight is down from her prepregnancy weight and she is aware of the need to eat a healthy diet.  Hair thinning - Plan: TSH -Check thyroid function and follow-up.  Discussed that this may be hormone related due to pregnancy and delivery approximately 3 months ago.  Umbilical hernia without obstruction and without gangrene -Watchful waiting for now and avoid heavy lifting.  She is aware of red flag symptoms related to hernias.  She will follow-up with her general surgeon if she decides to have surgery for this.

## 2020-05-20 NOTE — Patient Instructions (Signed)
Preventive Care 21-35 Years Old, Female Preventive care refers to lifestyle choices and visits with your health care provider that can promote health and wellness. This includes:  A yearly physical exam. This is also called an annual wellness visit.  Regular dental and eye exams.  Immunizations.  Screening for certain conditions.  Healthy lifestyle choices, such as: ? Eating a healthy diet. ? Getting regular exercise. ? Not using drugs or products that contain nicotine and tobacco. ? Limiting alcohol use. What can I expect for my preventive care visit? Physical exam Your health care provider may check your:  Height and weight. These may be used to calculate your BMI (body mass index). BMI is a measurement that tells if you are at a healthy weight.  Heart rate and blood pressure.  Body temperature.  Skin for abnormal spots. Counseling Your health care provider may ask you questions about your:  Past medical problems.  Family's medical history.  Alcohol, tobacco, and drug use.  Emotional well-being.  Home life and relationship well-being.  Sexual activity.  Diet, exercise, and sleep habits.  Work and work environment.  Access to firearms.  Method of birth control.  Menstrual cycle.  Pregnancy history. What immunizations do I need? Vaccines are usually given at various ages, according to a schedule. Your health care provider will recommend vaccines for you based on your age, medical history, and lifestyle or other factors, such as travel or where you work.   What tests do I need? Blood tests  Lipid and cholesterol levels. These may be checked every 5 years starting at age 20.  Hepatitis C test.  Hepatitis B test. Screening  Diabetes screening. This is done by checking your blood sugar (glucose) after you have not eaten for a while (fasting).  STD (sexually transmitted disease) testing, if you are at risk.  BRCA-related cancer screening. This may be  done if you have a family history of breast, ovarian, tubal, or peritoneal cancers.  Pelvic exam and Pap test. This may be done every 3 years starting at age 21. Starting at age 30, this may be done every 5 years if you have a Pap test in combination with an HPV test. Talk with your health care provider about your test results, treatment options, and if necessary, the need for more tests.   Follow these instructions at home: Eating and drinking  Eat a healthy diet that includes fresh fruits and vegetables, whole grains, lean protein, and low-fat dairy products.  Take vitamin and mineral supplements as recommended by your health care provider.  Do not drink alcohol if: ? Your health care provider tells you not to drink. ? You are pregnant, may be pregnant, or are planning to become pregnant.  If you drink alcohol: ? Limit how much you have to 0-1 drink a day. ? Be aware of how much alcohol is in your drink. In the [place] one drink equals one 12 oz bottle of beer (355 mL), one 5 oz glass of wine (148 mL), or one 1 oz glass of hard liquor (44 mL).   Lifestyle  Take daily care of your teeth and gums. Brush your teeth every morning and night with fluoride toothpaste. Floss one time each day.  Stay active. Exercise for at least 30 minutes 5 or more days each week.  Do not use any products that contain nicotine or tobacco, such as cigarettes, e-cigarettes, and chewing tobacco. If you need help quitting, ask your health care provider.  Do not   use drugs.  If you are sexually active, practice safe sex. Use a condom or other form of protection to prevent STIs (sexually transmitted infections).  If you do not wish to become pregnant, use a form of birth control. If you plan to become pregnant, see your health care provider for a prepregnancy visit.  Find healthy ways to cope with stress, such as: ? Meditation, yoga, or listening to music. ? Journaling. ? Talking to a trusted  person. ? Spending time with friends and family. Safety  Always wear your seat belt while driving or riding in a vehicle.  Do not drive: ? If you have been drinking alcohol. Do not ride with someone who has been drinking. ? When you are tired or distracted. ? While texting.  Wear a helmet and other protective equipment during sports activities.  If you have firearms in your house, make sure you follow all gun safety procedures.  Seek help if you have been physically or sexually abused. What's next?  Go to your health care provider once a year for an annual wellness visit.  Ask your health care provider how often you should have your eyes and teeth checked.  Stay up to date on all vaccines. This information is not intended to replace advice given to you by your health care provider. Make sure you discuss any questions you have with your health care provider. Document Revised: 09/10/2019 Document Reviewed: 09/23/2017 Elsevier Patient Education  2021 Elsevier Inc.  

## 2020-05-21 ENCOUNTER — Other Ambulatory Visit: Payer: Self-pay

## 2020-05-21 ENCOUNTER — Ambulatory Visit (INDEPENDENT_AMBULATORY_CARE_PROVIDER_SITE_OTHER): Payer: 59 | Admitting: Family Medicine

## 2020-05-21 ENCOUNTER — Encounter: Payer: Self-pay | Admitting: Family Medicine

## 2020-05-21 VITALS — BP 120/80 | HR 81 | Ht 67.5 in | Wt 223.4 lb

## 2020-05-21 DIAGNOSIS — E7800 Pure hypercholesterolemia, unspecified: Secondary | ICD-10-CM | POA: Insufficient documentation

## 2020-05-21 DIAGNOSIS — E669 Obesity, unspecified: Secondary | ICD-10-CM | POA: Diagnosis not present

## 2020-05-21 DIAGNOSIS — R140 Abdominal distension (gaseous): Secondary | ICD-10-CM

## 2020-05-21 DIAGNOSIS — Z0000 Encounter for general adult medical examination without abnormal findings: Secondary | ICD-10-CM | POA: Diagnosis not present

## 2020-05-21 DIAGNOSIS — K429 Umbilical hernia without obstruction or gangrene: Secondary | ICD-10-CM | POA: Diagnosis not present

## 2020-05-21 DIAGNOSIS — E039 Hypothyroidism, unspecified: Secondary | ICD-10-CM | POA: Diagnosis not present

## 2020-05-21 DIAGNOSIS — Z833 Family history of diabetes mellitus: Secondary | ICD-10-CM

## 2020-05-21 DIAGNOSIS — L659 Nonscarring hair loss, unspecified: Secondary | ICD-10-CM

## 2020-05-22 ENCOUNTER — Other Ambulatory Visit (HOSPITAL_COMMUNITY): Payer: Self-pay

## 2020-05-22 LAB — HEMOGLOBIN A1C
Est. average glucose Bld gHb Est-mCnc: 105 mg/dL
Hgb A1c MFr Bld: 5.3 % (ref 4.8–5.6)

## 2020-05-22 LAB — COMPREHENSIVE METABOLIC PANEL
ALT: 18 IU/L (ref 0–32)
AST: 14 IU/L (ref 0–40)
Albumin/Globulin Ratio: 1.8 (ref 1.2–2.2)
Albumin: 4.3 g/dL (ref 3.8–4.8)
Alkaline Phosphatase: 86 IU/L (ref 44–121)
BUN/Creatinine Ratio: 16 (ref 9–23)
BUN: 9 mg/dL (ref 6–20)
Bilirubin Total: 0.3 mg/dL (ref 0.0–1.2)
CO2: 23 mmol/L (ref 20–29)
Calcium: 9.5 mg/dL (ref 8.7–10.2)
Chloride: 103 mmol/L (ref 96–106)
Creatinine, Ser: 0.56 mg/dL — ABNORMAL LOW (ref 0.57–1.00)
Globulin, Total: 2.4 g/dL (ref 1.5–4.5)
Glucose: 84 mg/dL (ref 65–99)
Potassium: 4.8 mmol/L (ref 3.5–5.2)
Sodium: 143 mmol/L (ref 134–144)
Total Protein: 6.7 g/dL (ref 6.0–8.5)
eGFR: 122 mL/min/{1.73_m2} (ref >59)

## 2020-05-22 LAB — CBC WITH DIFFERENTIAL/PLATELET
Basophils Absolute: 0 10*3/uL (ref 0.0–0.2)
Basos: 1 %
EOS (ABSOLUTE): 0.5 10*3/uL — ABNORMAL HIGH (ref 0.0–0.4)
Eos: 8 %
Hematocrit: 40 % (ref 34.0–46.6)
Hemoglobin: 13.4 g/dL (ref 11.1–15.9)
Immature Grans (Abs): 0 10*3/uL (ref 0.0–0.1)
Immature Granulocytes: 0 %
Lymphocytes Absolute: 1.9 10*3/uL (ref 0.7–3.1)
Lymphs: 30 %
MCH: 28.7 pg (ref 26.6–33.0)
MCHC: 33.5 g/dL (ref 31.5–35.7)
MCV: 86 fL (ref 79–97)
Monocytes Absolute: 0.7 10*3/uL (ref 0.1–0.9)
Monocytes: 11 %
Neutrophils Absolute: 3.3 10*3/uL (ref 1.4–7.0)
Neutrophils: 50 %
Platelets: 253 10*3/uL (ref 150–450)
RBC: 4.67 x10E6/uL (ref 3.77–5.28)
RDW: 14 % (ref 11.7–15.4)
WBC: 6.4 10*3/uL (ref 3.4–10.8)

## 2020-05-22 LAB — LIPID PANEL
Chol/HDL Ratio: 4.3 ratio (ref 0.0–4.4)
Cholesterol, Total: 196 mg/dL (ref 100–199)
HDL: 46 mg/dL (ref >39)
LDL Chol Calc (NIH): 125 mg/dL — ABNORMAL HIGH (ref 0–99)
Triglycerides: 141 mg/dL (ref 0–149)
VLDL Cholesterol Cal: 25 mg/dL (ref 5–40)

## 2020-05-22 LAB — TSH: TSH: 0.676 u[IU]/mL (ref 0.450–4.500)

## 2020-05-28 ENCOUNTER — Encounter: Payer: Self-pay | Admitting: Family Medicine

## 2020-05-30 ENCOUNTER — Other Ambulatory Visit (HOSPITAL_COMMUNITY): Payer: Self-pay

## 2020-06-04 ENCOUNTER — Other Ambulatory Visit (HOSPITAL_COMMUNITY): Payer: Self-pay

## 2020-08-05 ENCOUNTER — Other Ambulatory Visit: Payer: Self-pay | Admitting: Family Medicine

## 2020-08-05 ENCOUNTER — Other Ambulatory Visit (HOSPITAL_COMMUNITY): Payer: Self-pay

## 2020-08-05 DIAGNOSIS — E039 Hypothyroidism, unspecified: Secondary | ICD-10-CM

## 2020-08-05 MED ORDER — LEVOTHYROXINE SODIUM 50 MCG PO TABS
ORAL_TABLET | Freq: Every day | ORAL | 1 refills | Status: DC
  Filled 2020-08-05: qty 90, 90d supply, fill #0

## 2020-08-15 ENCOUNTER — Other Ambulatory Visit (HOSPITAL_COMMUNITY): Payer: Self-pay

## 2020-08-15 MED ORDER — CARESTART COVID-19 HOME TEST VI KIT
PACK | 0 refills | Status: DC
  Filled 2020-08-15: qty 4, 4d supply, fill #0

## 2020-09-04 ENCOUNTER — Other Ambulatory Visit (HOSPITAL_COMMUNITY): Payer: Self-pay

## 2020-09-09 ENCOUNTER — Other Ambulatory Visit (HOSPITAL_COMMUNITY): Payer: Self-pay

## 2020-09-25 ENCOUNTER — Ambulatory Visit: Payer: Self-pay

## 2020-09-25 ENCOUNTER — Other Ambulatory Visit: Payer: Self-pay

## 2020-09-25 ENCOUNTER — Other Ambulatory Visit: Payer: Self-pay | Admitting: Family Medicine

## 2020-09-25 DIAGNOSIS — M79671 Pain in right foot: Secondary | ICD-10-CM

## 2020-10-07 DIAGNOSIS — N939 Abnormal uterine and vaginal bleeding, unspecified: Secondary | ICD-10-CM | POA: Diagnosis not present

## 2020-10-11 ENCOUNTER — Other Ambulatory Visit (HOSPITAL_COMMUNITY): Payer: Self-pay

## 2020-10-11 MED ORDER — LEVOTHYROXINE SODIUM 75 MCG PO TABS
ORAL_TABLET | ORAL | 1 refills | Status: DC
  Filled 2020-10-11: qty 30, 30d supply, fill #0
  Filled 2020-11-20: qty 30, 30d supply, fill #1

## 2020-10-14 ENCOUNTER — Telehealth: Payer: Self-pay | Admitting: Internal Medicine

## 2020-10-14 NOTE — Telephone Encounter (Signed)
Per shane ok to switch to shane

## 2020-10-18 DIAGNOSIS — R1084 Generalized abdominal pain: Secondary | ICD-10-CM | POA: Diagnosis not present

## 2020-10-22 ENCOUNTER — Other Ambulatory Visit: Payer: Self-pay | Admitting: Surgery

## 2020-10-22 DIAGNOSIS — R109 Unspecified abdominal pain: Secondary | ICD-10-CM

## 2020-11-06 ENCOUNTER — Other Ambulatory Visit (HOSPITAL_COMMUNITY): Payer: Self-pay

## 2020-11-06 ENCOUNTER — Telehealth: Payer: Self-pay | Admitting: Internal Medicine

## 2020-11-06 MED ORDER — BETAMETHASONE VALERATE 0.1 % EX CREA
TOPICAL_CREAM | Freq: Two times a day (BID) | CUTANEOUS | 2 refills | Status: DC
  Filled 2020-11-06: qty 45, 15d supply, fill #0

## 2020-11-06 NOTE — Telephone Encounter (Signed)
Patient needs a refill on this and per vickie ok to refill this

## 2020-11-07 ENCOUNTER — Other Ambulatory Visit (HOSPITAL_COMMUNITY): Payer: Self-pay

## 2020-11-12 ENCOUNTER — Inpatient Hospital Stay: Admission: RE | Admit: 2020-11-12 | Payer: 59 | Source: Ambulatory Visit

## 2020-11-20 DIAGNOSIS — E039 Hypothyroidism, unspecified: Secondary | ICD-10-CM | POA: Diagnosis not present

## 2020-11-21 ENCOUNTER — Other Ambulatory Visit (HOSPITAL_COMMUNITY): Payer: Self-pay

## 2020-11-26 ENCOUNTER — Other Ambulatory Visit (HOSPITAL_COMMUNITY): Payer: Self-pay

## 2020-11-27 ENCOUNTER — Other Ambulatory Visit (HOSPITAL_COMMUNITY): Payer: Self-pay

## 2020-11-27 MED ORDER — LEVOTHYROXINE SODIUM 50 MCG PO TABS
ORAL_TABLET | ORAL | 0 refills | Status: DC
  Filled 2020-11-27: qty 45, 90d supply, fill #0

## 2020-11-27 MED ORDER — LEVOTHYROXINE SODIUM 75 MCG PO TABS
ORAL_TABLET | ORAL | 0 refills | Status: DC
  Filled 2020-11-27 – 2021-01-13 (×2): qty 45, 90d supply, fill #0

## 2020-11-28 DIAGNOSIS — E039 Hypothyroidism, unspecified: Secondary | ICD-10-CM | POA: Diagnosis not present

## 2020-11-28 DIAGNOSIS — E161 Other hypoglycemia: Secondary | ICD-10-CM | POA: Diagnosis not present

## 2020-12-03 ENCOUNTER — Encounter: Payer: Self-pay | Admitting: Radiology

## 2020-12-03 NOTE — Progress Notes (Signed)
Spoke with patient today re: reaction to Barium based oral contrast- Patient states she picked up Berry flavored barium contrast at [facility] location for CT prep- She contacted our office that morning and was advised to take benadryl and stop contrast immediately.  She reports she felt burning in her throat, chest, and an internal itching sensation.  She also noted hives to both hands and feet.  She took 2 doses of 50mg  Benadryl each before symptoms resolved.  She did note symptoms improved with the first dose.  She also has an allergy to IV contrast dating back to 2014.  At this time we discussed drinking just clear liquids for her upcoming CT, and not receiving any contrast.  Allergy history updated

## 2020-12-05 ENCOUNTER — Ambulatory Visit
Admission: RE | Admit: 2020-12-05 | Discharge: 2020-12-05 | Disposition: A | Discharge status: Home / Self Care | Payer: 59 | Source: Ambulatory Visit | Attending: Surgery | Admitting: Surgery

## 2020-12-05 ENCOUNTER — Other Ambulatory Visit: Payer: Self-pay

## 2020-12-05 DIAGNOSIS — R1013 Epigastric pain: Secondary | ICD-10-CM | POA: Diagnosis not present

## 2020-12-05 DIAGNOSIS — K429 Umbilical hernia without obstruction or gangrene: Secondary | ICD-10-CM | POA: Diagnosis not present

## 2020-12-05 DIAGNOSIS — R109 Unspecified abdominal pain: Secondary | ICD-10-CM

## 2021-01-14 ENCOUNTER — Other Ambulatory Visit (HOSPITAL_COMMUNITY): Payer: Self-pay

## 2021-01-26 DIAGNOSIS — K200 Eosinophilic esophagitis: Secondary | ICD-10-CM

## 2021-01-26 HISTORY — DX: Eosinophilic esophagitis: K20.0

## 2021-01-26 HISTORY — PX: ESOPHAGOGASTRODUODENOSCOPY: SHX1529

## 2021-02-24 ENCOUNTER — Other Ambulatory Visit (HOSPITAL_COMMUNITY): Payer: Self-pay

## 2021-02-24 ENCOUNTER — Telehealth: Payer: Self-pay | Admitting: Internal Medicine

## 2021-02-24 MED ORDER — BETAMETHASONE VALERATE 0.1 % EX CREA
TOPICAL_CREAM | Freq: Two times a day (BID) | CUTANEOUS | 2 refills | Status: DC
  Filled 2021-02-24 – 2021-06-24 (×2): qty 45, 14d supply, fill #0
  Filled 2021-06-24 – 2021-08-05 (×2): qty 45, 14d supply, fill #1

## 2021-02-24 MED ORDER — LEVOTHYROXINE SODIUM 50 MCG PO TABS
ORAL_TABLET | ORAL | 0 refills | Status: DC
  Filled 2021-02-24: qty 45, 90d supply, fill #0

## 2021-02-24 MED ORDER — LEVOTHYROXINE SODIUM 75 MCG PO TABS
ORAL_TABLET | ORAL | 0 refills | Status: DC
  Filled 2021-02-24: qty 45, fill #0
  Filled 2021-04-12 – 2021-04-24 (×2): qty 45, 90d supply, fill #0

## 2021-02-24 NOTE — Telephone Encounter (Signed)
Pt called and needed a refill on and that she takes every other day. She also needed a refill on her cream.

## 2021-02-25 ENCOUNTER — Other Ambulatory Visit (HOSPITAL_COMMUNITY): Payer: Self-pay

## 2021-02-27 ENCOUNTER — Other Ambulatory Visit (HOSPITAL_COMMUNITY): Payer: Self-pay

## 2021-04-11 ENCOUNTER — Other Ambulatory Visit (HOSPITAL_COMMUNITY): Payer: Self-pay

## 2021-04-12 ENCOUNTER — Other Ambulatory Visit (HOSPITAL_COMMUNITY): Payer: Self-pay

## 2021-04-23 ENCOUNTER — Other Ambulatory Visit (HOSPITAL_COMMUNITY): Payer: Self-pay

## 2021-04-24 ENCOUNTER — Other Ambulatory Visit (HOSPITAL_COMMUNITY): Payer: Self-pay

## 2021-05-16 ENCOUNTER — Other Ambulatory Visit (HOSPITAL_COMMUNITY): Payer: Self-pay

## 2021-05-16 ENCOUNTER — Telehealth: Payer: Self-pay | Admitting: Internal Medicine

## 2021-05-16 MED ORDER — LEVOTHYROXINE SODIUM 50 MCG PO TABS
ORAL_TABLET | ORAL | 0 refills | Status: DC
Start: 2021-05-16 — End: 2021-05-26
  Filled 2021-05-16: qty 45, 90d supply, fill #0

## 2021-05-16 NOTE — Telephone Encounter (Signed)
Pt has asked for a refill on 64mcg. She takes this every other day as she alternates with 2mcg ?

## 2021-05-23 ENCOUNTER — Encounter: Payer: Self-pay | Admitting: Medical

## 2021-05-23 ENCOUNTER — Ambulatory Visit (INDEPENDENT_AMBULATORY_CARE_PROVIDER_SITE_OTHER): Payer: No Typology Code available for payment source | Admitting: Medical

## 2021-05-23 VITALS — BP 110/70 | HR 64 | Ht 69.0 in | Wt 224.6 lb

## 2021-05-23 DIAGNOSIS — Z6833 Body mass index (BMI) 33.0-33.9, adult: Secondary | ICD-10-CM | POA: Insufficient documentation

## 2021-05-23 DIAGNOSIS — L989 Disorder of the skin and subcutaneous tissue, unspecified: Secondary | ICD-10-CM | POA: Insufficient documentation

## 2021-05-23 DIAGNOSIS — L309 Dermatitis, unspecified: Secondary | ICD-10-CM | POA: Insufficient documentation

## 2021-05-23 DIAGNOSIS — E039 Hypothyroidism, unspecified: Secondary | ICD-10-CM

## 2021-05-23 DIAGNOSIS — Z13220 Encounter for screening for lipoid disorders: Secondary | ICD-10-CM

## 2021-05-23 DIAGNOSIS — Z0000 Encounter for general adult medical examination without abnormal findings: Secondary | ICD-10-CM | POA: Insufficient documentation

## 2021-05-23 DIAGNOSIS — K429 Umbilical hernia without obstruction or gangrene: Secondary | ICD-10-CM | POA: Diagnosis not present

## 2021-05-23 NOTE — Patient Instructions (Signed)
?This visit was a preventative care visit, also known as wellness visit or routine physical.   Topics typically include healthy lifestyle, diet, exercise, preventative care, vaccinations, sick and well care, proper use of emergency dept and after hours care, as well as other concerns.   ? ? ?Recommendations: ?Continue to return yearly for your annual wellness and preventative care visits.  This gives [place] a chance to discuss healthy lifestyle, exercise, vaccinations, review your chart record, and perform screenings where appropriate. ? ?I recommend you see your eye doctor yearly for routine vision care. ? ?I recommend you see your dentist yearly for routine dental care including hygiene visits twice yearly. ? ?See your gynecologist yearly for routine gynecological care. ? ? ?Vaccination recommendations were reviewed ?Immunization History  ?Administered Date(s) Administered  ? Tdap 10/27/2017, 01/03/2020  ? ? ? ?Screening for cancer: ?Colon cancer screening: ?Age 36 ? ?Breast cancer screening: ?You should perform a self breast exam monthly.   ?We reviewed recommendations for regular mammograms and breast cancer screening. ? ?Cervical cancer screening: ?We reviewed recommendations for pap smear screening. ?  ?Skin cancer screening: ?Check your skin regularly for new changes, growing lesions, or other lesions of concern ?Come in for evaluation if you have skin lesions of concern. ? ?Lung cancer screening: ?If you have a greater than 20 pack year history of tobacco use, then you may qualify for lung cancer screening with a chest CT scan.   Please call your insurance company to inquire about coverage for this test. ? ?We currently don't have screenings for other cancers besides breast, cervical, colon, and lung cancers.  If you have a strong family history of cancer or have other cancer screening concerns, please let me know.  ? ? ?Bone health: ?Get at least 150 minutes of aerobic exercise weekly ?Get weight bearing  exercise at least once weekly ?Bone density test:  ?A bone density test is an imaging test that uses a type of X-ray to measure the amount of calcium and other minerals in your bones. ?The test may be used to diagnose or screen you for a condition that causes weak or thin bones (osteoporosis), predict your risk for a broken bone (fracture), or determine how well your osteoporosis treatment is working. ?The bone density test is recommended for females 65 and older, or females or males <65 if certain risk factors such as thyroid disease, long term use of steroids such as for asthma or rheumatological issues, vitamin D deficiency, estrogen deficiency, family history of osteoporosis, self or family history of fragility fracture in first degree relative. ? ? ? ?Heart health: ?Get at least 150 minutes of aerobic exercise weekly ?Limit alcohol ?It is important to maintain a healthy blood pressure and healthy cholesterol numbers ? ?Heart disease screening: ?Screening for heart disease includes screening for blood pressure, fasting lipids, glucose/diabetes screening, BMI height to weight ratio, reviewed of smoking status, physical activity, and diet.   ? ?Goals include blood pressure 120/80 or less, maintaining a healthy lipid/cholesterol profile, preventing diabetes or keeping diabetes numbers under good control, not smoking or using tobacco products, exercising most days per week or at least 150 minutes per week of exercise, and eating healthy variety of fruits and vegetables, healthy oils, and avoiding unhealthy food choices like fried food, fast food, high sugar and high cholesterol foods.   ? ?Other tests may possibly include EKG test, CT coronary calcium score, echocardiogram, exercise treadmill stress test.  ? ? ?Medical care options: ?I recommend you continue  to seek care here first for routine care.  We try really hard to have available appointments Monday through Friday daytime hours for sick visits, acute visits,  and physicals.  Urgent care should be used for after hours and weekends for significant issues that cannot wait till the next day.  The emergency department should be used for significant potentially life-threatening emergencies.  The emergency department is expensive, can often have long wait times for less significant concerns, so try to utilize primary care, urgent care, or telemedicine when possible to avoid unnecessary trips to the emergency department.  Virtual visits and telemedicine have been introduced since the pandemic started in 2020, and can be convenient ways to receive medical care.  We offer virtual appointments as well to assist you in a variety of options to seek medical care. ? ? ?Separate significant issues discussed: ?Problem List Items Addressed This Visit   ? ? Hypothyroidism  ?  On therapy alternating dose every other day.   Updated labs today ? ?  ?  ? Relevant Orders  ? TSH + free T4  ? Umbilical hernia without obstruction and without gangrene  ? Eczema  ?  Use daily moisturizing lotion.   Steroid cream for flare ups ? ?  ?  ? Encounter for health maintenance examination in adult - Primary  ? Relevant Orders  ? Comprehensive metabolic panel  ? CBC  ? Lipid panel  ? TSH + free T4  ? Cortisol  ? BMI 33.0-33.9,adult  ? Skin lesion  ?  Referral to dermatology for baseline evaluation ? ?  ?  ? Relevant Orders  ? Ambulatory referral to Dermatology  ? ?Other Visit Diagnoses   ? ? Screening for lipid disorders      ? Relevant Orders  ? Lipid panel  ? ?  ? ?

## 2021-05-23 NOTE — Assessment & Plan Note (Signed)
On therapy alternating dose every other day.   Updated labs today ?

## 2021-05-23 NOTE — Assessment & Plan Note (Signed)
Use daily moisturizing lotion.   Steroid cream for flare ups ?

## 2021-05-23 NOTE — Progress Notes (Signed)
?Subjective:  ? ?HPI ? Melissa Torres is a 36 y.o. female who presents for ?Chief Complaint  ?Patient presents with  ? fasting cpe  ?  Fasting cpe, has obgyn, no concerns ?  ? ? ?Patient Care Team: ?Lashondra Vaquerano, Cleda Mccreedyavid S, PA-C as PCP - General (Family Medicine) ?Sees dentist ?gynecology ? ?Concerns: ?Thyroid Problem ?Presents for follow-up (no concerns.  uses 2 doses that she rotates daily) visit.  ? ? ?Reviewed their medical, surgical, family, social, medication, and allergy history and updated chart as appropriate. ? ?Past Medical History:  ?Diagnosis Date  ? Eczema   ? Hypoglycemia   ? Hypothyroidism   ? Internal derangement of right knee   ? Newborn product of in vitro fertilization (IVF) pregnancy   ? Unicornate uterus affecting pregnancy   ? ? ?Family History  ?Problem Relation Age of Onset  ? Hypertension Mother   ? Diabetes Mother   ? Atrial fibrillation Mother   ? Crohn's disease Sister   ? Hypertension Maternal Grandmother   ? Heart failure Maternal Grandmother   ? Cancer Maternal Grandfather   ?     prostate  ? Diabetes Maternal Grandfather   ? Prostate cancer Maternal Grandfather   ? Heart disease Neg Hx   ? ? ? ?Current Outpatient Medications:  ?  betamethasone valerate (VALISONE) 0.1 % cream, Apply topically 2 (two) times daily., Disp: 45 g, Rfl: 2 ?  levothyroxine (SYNTHROID) 50 MCG tablet, Take one tablet by mouth every other day, alternate with 75mcg dosage., Disp: 45 tablet, Rfl: 0 ?  levothyroxine (SYNTHROID) 75 MCG tablet, Take 1 tablet by mouth every other day, alternate with 50mcg dose., Disp: 45 tablet, Rfl: 0 ? ?Allergies  ?Allergen Reactions  ? Ivp Dye [Iodinated Contrast Media] Hives and Itching  ? Barium-Containing Compounds Hives, Itching and Other (See Comments)  ?  Burning in throat and chest and itching all over  ?Hives on both feet and both hands  ?Took 2 doses 100 mg Benadryl to resolve symptoms   ? Influenza Vaccines Other (See Comments)  ?  Painful pustules in mouth and throat  ?  Other   ?  Steroids- hallucinations, anxiety ?CHG wipes/solutions--flares up eczema/itching  ? Prednisone   ?  Oral steroids give hallucinations, but does ok with topical steroids  ? Latex Itching and Rash  ?  SEVERE ITCHING  ? ? ?Review of Systems ?Constitutional: -fever, -chills, -sweats, -unexpected weight change, -decreased appetite, -fatigue ?Allergy: -sneezing, -itching, -congestion ?Dermatology: -changing moles, --rash, -lumps ?ENT: -runny nose, -ear pain, -sore throat, -hoarseness, -sinus pain, -teeth pain, - ringing in ears, -hearing loss, -nosebleeds ?Cardiology: -chest pain, -palpitations, -swelling, -difficulty breathing when lying flat, -waking up short of breath ?Respiratory: -cough, -shortness of breath, -difficulty breathing with exercise or exertion, -wheezing, -coughing up blood ?Gastroenterology: -abdominal pain, -nausea, -vomiting, -diarrhea, -constipation, -blood in stool, -changes in bowel movement, -difficulty swallowing or eating ?Hematology: -bleeding, -bruising  ?Musculoskeletal: -joint aches, -muscle aches, -joint swelling, -back pain, -neck pain, -cramping, -changes in gait ?Ophthalmology: denies vision changes, eye redness, itching, discharge ?Urology: -burning with urination, -difficulty urinating, -blood in urine, -urinary frequency, -urgency, -incontinence ?Neurology: -headache, -weakness, -tingling, -numbness, -memory loss, -falls, -dizziness ?Psychology: -depressed mood, -agitation, -sleep problems ?Breast/gyn: -breast tendnerss, -discharge, -lumps, -vaginal discharge,- irregular periods, -heavy periods ? ? ? ?  05/23/2021  ?  8:27 AM 05/21/2020  ?  8:27 AM 03/15/2019  ?  8:20 AM 02/25/2018  ? 10:54 AM  ?Depression screen PHQ 2/9  ?Decreased Interest 0  0 0   ?Down, Depressed, Hopeless 0 0 0   ?PHQ - 2 Score 0 0 0   ?  ? Information is confidential and restricted. Go to Review Flowsheets to unlock data.  ? ? ?   ?Objective:  ?BP 110/70   Pulse 64   Ht 5\' 9"  (1.753 m)   Wt 224 lb 9.6  oz (101.9 kg)   BMI 33.17 kg/m?  ? ?General appearance: alert, no distress, WD/WN, Caucasian female ?Skin: right face lower with solar keratosis, other faint solar keratoses on left cheek, no other worrisome lesions ?HEENT: normocephalic, conjunctiva/corneas normal, sclerae anicteric, PERRLA, EOMi, nares patent, no discharge or erythema, pharynx normal ?Oral cavity: MMM, tongue normal, teeth normal ?Neck: supple, no lymphadenopathy, no thyromegaly, no masses, normal ROM, no bruits ?Chest: non tender, normal shape and expansion ?Heart: RRR, normal S1, S2, no murmurs ?Lungs: CTA bilaterally, no wheezes, rhonchi, or rales ?Abdomen: +bs, soft, small reducible umbilical hernia, non tender, non distended, no masses, no hepatomegaly, no splenomegaly, no bruits ?Back: non tender, normal ROM, no scoliosis ?Musculoskeletal: upper extremities non tender, no obvious deformity, normal ROM throughout, lower extremities non tender, no obvious deformity, normal ROM throughout ?Extremities: no edema, no cyanosis, no clubbing ?Pulses: 2+ symmetric, upper and lower extremities, normal cap refill ?Neurological: alert, oriented x 3, CN2-12 intact, strength normal upper extremities and lower extremities, sensation normal throughout, DTRs 2+ throughout, no cerebellar signs, gait normal ?Psychiatric: normal affect, behavior normal, pleasant  ?Breast/gyn/rectal - deferred to gynecology  ? ? ? ?Assessment and Plan :  ? ?Encounter Diagnoses  ?Name Primary?  ? Encounter for health maintenance examination in adult Yes  ? Umbilical hernia without obstruction and without gangrene   ? Eczema, unspecified type   ? Hypothyroidism, unspecified type   ? BMI 33.0-33.9,adult   ? Skin lesion   ? Screening for lipid disorders   ? ? ?This visit was a preventative care visit, also known as wellness visit or routine physical.   Topics typically include healthy lifestyle, diet, exercise, preventative care, vaccinations, sick and well care, proper use of  emergency dept and after hours care, as well as other concerns.   ? ? ?Recommendations: ?Continue to return yearly for your annual wellness and preventative care visits.  This gives 07-06-1980 a chance to discuss healthy lifestyle, exercise, vaccinations, review your chart record, and perform screenings where appropriate. ? ?I recommend you see your eye doctor yearly for routine vision care. ? ?I recommend you see your dentist yearly for routine dental care including hygiene visits twice yearly. ? ?See your gynecologist yearly for routine gynecological care. ? ? ?Vaccination recommendations were reviewed ?Immunization History  ?Administered Date(s) Administered  ? Tdap 10/27/2017, 01/03/2020  ? ? ? ?Screening for cancer: ?Colon cancer screening: ?Age 1 ? ?Breast cancer screening: ?You should perform a self breast exam monthly.   ?We reviewed recommendations for regular mammograms and breast cancer screening. ? ?Cervical cancer screening: ?We reviewed recommendations for pap smear screening. ?  ?Skin cancer screening: ?Check your skin regularly for new changes, growing lesions, or other lesions of concern ?Come in for evaluation if you have skin lesions of concern. ? ?Lung cancer screening: ?If you have a greater than 20 pack year history of tobacco use, then you may qualify for lung cancer screening with a chest CT scan.   Please call your insurance company to inquire about coverage for this test. ? ?We currently don't have screenings for other cancers besides breast, cervical, colon, and  lung cancers.  If you have a strong family history of cancer or have other cancer screening concerns, please let me know.  ? ? ?Bone health: ?Get at least 150 minutes of aerobic exercise weekly ?Get weight bearing exercise at least once weekly ?Bone density test:  ?A bone density test is an imaging test that uses a type of X-ray to measure the amount of calcium and other minerals in your bones. ?The test may be used to diagnose or screen  you for a condition that causes weak or thin bones (osteoporosis), predict your risk for a broken bone (fracture), or determine how well your osteoporosis treatment is working. ?The bone density test is recomm

## 2021-05-23 NOTE — Assessment & Plan Note (Signed)
Referral to dermatology for baseline evaluation ?

## 2021-05-24 LAB — COMPREHENSIVE METABOLIC PANEL
ALT: 14 IU/L (ref 0–32)
AST: 13 IU/L (ref 0–40)
Albumin/Globulin Ratio: 1.7 (ref 1.2–2.2)
Albumin: 4.5 g/dL (ref 3.8–4.8)
Alkaline Phosphatase: 82 IU/L (ref 44–121)
BUN/Creatinine Ratio: 16 (ref 9–23)
BUN: 10 mg/dL (ref 6–20)
Bilirubin Total: 0.4 mg/dL (ref 0.0–1.2)
CO2: 26 mmol/L (ref 20–29)
Calcium: 9.7 mg/dL (ref 8.7–10.2)
Chloride: 100 mmol/L (ref 96–106)
Creatinine, Ser: 0.62 mg/dL (ref 0.57–1.00)
Globulin, Total: 2.6 g/dL (ref 1.5–4.5)
Glucose: 81 mg/dL (ref 70–99)
Potassium: 4.5 mmol/L (ref 3.5–5.2)
Sodium: 139 mmol/L (ref 134–144)
Total Protein: 7.1 g/dL (ref 6.0–8.5)
eGFR: 118 mL/min/{1.73_m2} (ref >59)

## 2021-05-24 LAB — CBC
Hematocrit: 39.9 % (ref 34.0–46.6)
Hemoglobin: 13.3 g/dL (ref 11.1–15.9)
MCH: 28.9 pg (ref 26.6–33.0)
MCHC: 33.3 g/dL (ref 31.5–35.7)
MCV: 87 fL (ref 79–97)
Platelets: 278 10*3/uL (ref 150–450)
RBC: 4.6 x10E6/uL (ref 3.77–5.28)
RDW: 13 % (ref 11.7–15.4)
WBC: 7.1 10*3/uL (ref 3.4–10.8)

## 2021-05-24 LAB — CORTISOL: Cortisol: 6.2 ug/dL (ref 6.2–19.4)

## 2021-05-24 LAB — LIPID PANEL
Chol/HDL Ratio: 4.8 ratio — ABNORMAL HIGH (ref 0.0–4.4)
Cholesterol, Total: 216 mg/dL — ABNORMAL HIGH (ref 100–199)
HDL: 45 mg/dL (ref >39)
LDL Chol Calc (NIH): 143 mg/dL — ABNORMAL HIGH (ref 0–99)
Triglycerides: 156 mg/dL — ABNORMAL HIGH (ref 0–149)
VLDL Cholesterol Cal: 28 mg/dL (ref 5–40)

## 2021-05-24 LAB — TSH+FREE T4
Free T4: 1.5 ng/dL (ref 0.82–1.77)
TSH: 2.42 u[IU]/mL (ref 0.450–4.500)

## 2021-05-26 ENCOUNTER — Telehealth: Payer: Self-pay | Admitting: Internal Medicine

## 2021-05-26 ENCOUNTER — Other Ambulatory Visit (HOSPITAL_COMMUNITY): Payer: Self-pay

## 2021-05-26 ENCOUNTER — Other Ambulatory Visit: Payer: Self-pay | Admitting: Medical

## 2021-05-26 DIAGNOSIS — K56699 Other intestinal obstruction unspecified as to partial versus complete obstruction: Secondary | ICD-10-CM

## 2021-05-26 MED ORDER — LEVOTHYROXINE SODIUM 75 MCG PO TABS
ORAL_TABLET | ORAL | 3 refills | Status: DC
  Filled 2021-05-26: qty 45, fill #0
  Filled 2021-07-24: qty 45, 90d supply, fill #0
  Filled 2021-10-24: qty 45, 90d supply, fill #1

## 2021-05-26 MED ORDER — LEVOTHYROXINE SODIUM 50 MCG PO TABS
ORAL_TABLET | ORAL | 3 refills | Status: DC
  Filled 2021-05-26: qty 45, fill #0
  Filled 2021-08-08: qty 45, 90d supply, fill #0

## 2021-05-26 NOTE — Telephone Encounter (Signed)
Put in referral.  

## 2021-05-26 NOTE — Telephone Encounter (Signed)
Patient was sent my EMS in [place] on Saturday as she was at [facility] and steak got stuck in her throat. Patient states she is having esophagus surgery on Friday for expansion. She is seeing [facility] this Wednesday but [facility] is unable to send a referral up to [place]. Wants to know can we do this instead.  ? ? ? ?

## 2021-05-28 ENCOUNTER — Encounter: Payer: Self-pay | Admitting: Gastroenterology

## 2021-05-28 ENCOUNTER — Ambulatory Visit: Payer: No Typology Code available for payment source | Admitting: Gastroenterology

## 2021-05-28 VITALS — BP 108/66 | HR 88 | Ht 67.0 in | Wt 224.1 lb

## 2021-05-28 DIAGNOSIS — R142 Eructation: Secondary | ICD-10-CM | POA: Insufficient documentation

## 2021-05-28 DIAGNOSIS — R1310 Dysphagia, unspecified: Secondary | ICD-10-CM | POA: Diagnosis not present

## 2021-05-28 DIAGNOSIS — J029 Acute pharyngitis, unspecified: Secondary | ICD-10-CM | POA: Diagnosis not present

## 2021-05-28 NOTE — Progress Notes (Signed)
Attending Physician's Attestation  ? ?I have reviewed the chart.  ? ?I agree with the Advanced Practitioner's note, impression, and recommendations with any updates as below. ?Endoscopic evaluation is very reasonable.  Rule out EOE and empiric dilation if no overt stricture is noted. ? ? ?Corliss Parish, MD ?[facility] ?Advanced Endoscopy ?Office # [phone] ? ?

## 2021-05-28 NOTE — Progress Notes (Signed)
? ? ? ?05/28/2021 ?RAJVI ARMENTOR ?[id number] ?24-Mar-1985 ? ? ?HISTORY OF PRESENT ILLNESS: This is a 36 year old female who is new to our office.  She has been referred here by Crosby Oyster, PA-C, for evaluation of a recent food bolus.  She tells me that for the past 6 to 8 months that she would have occasional episodes of dysphagia where she would have to somewhat perform a chin tuck to get the food to pass.  Then this past weekend she was at the [facility] with her family and had a severe episode where food got stuck causing her to pass out and taking her to the emergency room at [facility].  She describes that the food got caught and cut off her airway, but then somewhat passed into her esophagus and it was very hard to go down there.  She says that she had already eaten salad and some cheese and crackers prior to that and already even 6 bites of her steak before this occurred.  She passed out and was taken via EMS to [facility].  Eventually the food passed with glucagon.  She has been on liquids and pur?ed type foods for the past several days since this occurred.  She says that since this occurred she has been having some heartburn and reflux sensation and a sore throat as well as increased belching.  Prior to this she never had any of those symptoms. ? ? ?Past Medical History:  ?Diagnosis Date  ? Eczema   ? Hypoglycemia   ? Hypothyroidism   ? Internal derangement of right knee   ? Newborn product of in vitro fertilization (IVF) pregnancy   ? Unicornate uterus affecting pregnancy   ? ?Past Surgical History:  ?Procedure Laterality Date  ? CESAREAN SECTION N/A 12/25/2017  ? Procedure: Primary CESAREAN SECTION;  Surgeon: Maxie Better, MD;  Location: [facility]IR[facility] SU[facility];  Service: Obstetrics;  Laterality: N/A;  EDD: 01/01/18 ?Allergy: IVP Dye, Flu Vaccine ?Karmen Stabs, RNFA  ?Cline Crock, CST ?Marrion Coy, RN ?Babs Bertin, CRNA  ? CESAREAN SECTION WITH BILATERAL TUBAL LIGATION Bilateral 02/02/2020  ? Procedure:  CESAREAN SECTION WITH BILATERAL TUBAL LIGATION;  Surgeon: Olivia Mackie, MD;  Location: [facility] LD ORS;  Service: Obstetrics;  Laterality: Bilateral;  ? egg retrieval    ? KNEE ARTHROSCOPY Right 11/07/2012  ? Procedure: RIGHT KNEE ARTHROSCOPY PLICA RESECTION DEBRIDEMENT AND CHONDROPLASTY ;  Surgeon: Eugenia Mcalpine, MD;  Location: [facility];  Service: Orthopedics;  Laterality: Right;  ? TONSILLECTOMY AND ADENOIDECTOMY  AGE 52  ? ? reports that she has never smoked. She has never used smokeless tobacco. She reports that she does not currently use alcohol. She reports that she does not use drugs. ?family history includes Atrial fibrillation in her mother; Crohn's disease in her sister; Diabetes in her maternal grandfather and mother; Heart failure in her maternal grandmother; Hypertension in her mother; Prostate cancer in her maternal grandfather. ?Allergies  ?Allergen Reactions  ? Ivp Dye [Iodinated Contrast Media] Hives and Itching  ? Barium-Containing Compounds Hives, Itching and Other (See Comments)  ?  Burning in throat and chest and itching all over  ?Hives on both feet and both hands  ?Took 2 doses 100 mg Benadryl to resolve symptoms   ? Influenza Vaccines Other (See Comments)  ?  Painful pustules in mouth and throat  ? Other   ?  Steroids- hallucinations, anxiety ?CHG wipes/solutions--flares up eczema/itching  ? Prednisone   ?  Oral steroids give hallucinations, but does  ok with topical steroids  ? Latex Itching and Rash  ?  SEVERE ITCHING  ? ? ?  ?Outpatient Encounter Medications as of 05/28/2021  ?Medication Sig  ? betamethasone valerate (VALISONE) 0.1 % cream Apply topically 2 (two) times daily.  ? levothyroxine (SYNTHROID) 50 MCG tablet Take 1 tablet by mouth every other day, alternate with dosage.  ? levothyroxine (SYNTHROID) 75 MCG tablet Take 1 tablet by mouth every other day, alternate with dose.  ? Multiple Vitamin (MULTIVITAMIN) tablet Take 1 tablet by mouth daily.  ? ?No  facility-administered encounter medications on file as of 05/28/2021.  ? ? ? ?REVIEW OF SYSTEMS  : All other systems reviewed and negative except where noted in the History of Present Illness. ? ? ?PHYSICAL EXAM: ?BP 108/66   Pulse 88   Ht 5\' 7"  (1.702 m) Comment: height measured without shoes  Wt 224 lb 2 oz (101.7 kg)   LMP 05/15/2021   Breastfeeding No   BMI 35.10 kg/m?  ?General: Well developed white female in no acute distress ?Head: Normocephalic and atraumatic ?Eyes:  Sclerae anicteric, conjunctiva pink. ?Ears: Normal auditory acuity ?Lungs: Clear throughout to auscultation; no W/R/R. ?Heart: Regular rate and rhythm; no M/R/G. ?Musculoskeletal: Symmetrical with no gross deformities  ?Skin: No lesions on visible extremities ?Extremities: No edema  ?Neurological: Alert oriented x 4, grossly non-focal ?Psychological:  Alert and cooperative. Normal mood and affect ? ?ASSESSMENT AND PLAN: ?*Dysphagia with belching and sore throat: Occasional episode of dysphagia where she would have to somewhat perform a chin tuck to get the food to pass, which has been occurring over the past 6 to 8 months, but a severe episode over the weekend where food got stuck causing her to pass out and taking her to the emergency room at [facility].  She describes that the food got caught and cut off her airway, but then somewhat passed into her esophagus and it was very hard to go down there.  Eventually passed with glucagon.  Question esophageal stricture versus EOE versus more of an oral pharyngeal dysphagia.  She has been on liquids and pur?ed type foods for the past several days since this occurred.  She is being scheduled for EGD with possible dilation with [facility] on 5/5.  The risks, benefits, and alternatives to EGD with dilation were discussed with the patient and she consents to proceed.  ? ?CC:  Tysinger, Meridee Score, PA-C ? ?  ?

## 2021-05-28 NOTE — Patient Instructions (Signed)
If you are age 36 or older, your body mass index should be between 23-30. Your Body mass index is 35.1 kg/m?Marland Kitchen If this is out of the aforementioned range listed, please consider follow up with your Primary Care Provider. ? ?If you are age 5 or younger, your body mass index should be between 19-25. Your Body mass index is 35.1 kg/m?Marland Kitchen If this is out of the aformentioned range listed, please consider follow up with your Primary Care Provider.  ? ?________________________________________________________ ? ?The [facility] providers would like to encourage you to use [facility] to communicate with providers for non-urgent requests or questions.  Due to long hold times on the telephone, sending your provider a message by [facility] may be a faster and more efficient way to get a response.  Please allow 48 business hours for a response.  Please remember that this is for non-urgent requests.  ?_______________________________________________________ ? ?You have been scheduled for an endoscopy. Please follow written instructions given to you at your visit today. ?If you use inhalers (even only as needed), please bring them with you on the day of your procedure. ? ?It was a pleasure to see you today! ? ?Thank you for trusting me with your gastrointestinal care!   ? ? ?

## 2021-05-30 ENCOUNTER — Encounter: Payer: Self-pay | Admitting: Gastroenterology

## 2021-05-30 ENCOUNTER — Other Ambulatory Visit (HOSPITAL_COMMUNITY): Payer: Self-pay

## 2021-05-30 ENCOUNTER — Telehealth: Payer: Self-pay | Admitting: *Deleted

## 2021-05-30 ENCOUNTER — Ambulatory Visit (AMBULATORY_SURGERY_CENTER): Payer: No Typology Code available for payment source | Admitting: Gastroenterology

## 2021-05-30 VITALS — BP 122/78 | HR 53 | Temp 98.2°F | Resp 12

## 2021-05-30 DIAGNOSIS — K219 Gastro-esophageal reflux disease without esophagitis: Secondary | ICD-10-CM | POA: Diagnosis not present

## 2021-05-30 DIAGNOSIS — K200 Eosinophilic esophagitis: Secondary | ICD-10-CM

## 2021-05-30 DIAGNOSIS — K229 Disease of esophagus, unspecified: Secondary | ICD-10-CM | POA: Diagnosis not present

## 2021-05-30 DIAGNOSIS — R1310 Dysphagia, unspecified: Secondary | ICD-10-CM

## 2021-05-30 DIAGNOSIS — K209 Esophagitis, unspecified without bleeding: Secondary | ICD-10-CM | POA: Diagnosis not present

## 2021-05-30 MED ORDER — SODIUM CHLORIDE 0.9 % IV SOLN: 500.0000 mL | Freq: Once | INTRAVENOUS | Status: DC

## 2021-05-30 MED ORDER — LANSOPRAZOLE 30 MG PO CPDR
30.0000 mg | DELAYED_RELEASE_CAPSULE | Freq: Two times a day (BID) | ORAL | 0 refills | Status: DC
  Filled 2021-05-30: qty 180, 90d supply, fill #0

## 2021-05-30 NOTE — Progress Notes (Signed)
Pt's states no medical or surgical changes since previsit or office visit. 

## 2021-05-30 NOTE — Telephone Encounter (Signed)
Pt called back stating she just noticed a bruise appear on her right medial calf that is elongated, small and faint. She also complains of pain in that area with dorsiflexion. States she is able to bear weight on it without any pain. Pt wanted to report this because it was new and she did not know if she should seek medical attention. RN asked pt if she recalls hitting her leg on something in the past couple of days. She denies and states the bruise and calf pain were not present prior to the procedure. RN instructed that MD would be made aware but it was not likely that this came from the procedure today.  ?

## 2021-05-30 NOTE — Patient Instructions (Signed)
Information on esophagitis given to you today. ? ?Await pathology results from the biopsies taken today. ? ?Use Prevacid 30 mg by mouth twice a day for 3 months before breakfast and dinner. ? ?Return to GI clinic in 2 months. ? ? ?YOU HAD AN ENDOSCOPIC PROCEDURE TODAY AT THE [facility]:   Refer to the procedure report that was given to you for any specific questions about what was found during the examination.  If the procedure report does not answer your questions, please call your gastroenterologist to clarify.  If you requested that your care partner not be given the details of your procedure findings, then the procedure report has been included in a sealed envelope for you to review at your convenience later. ? ?YOU SHOULD EXPECT: Some feelings of bloating in the abdomen. Passage of more gas than usual.  Walking can help get rid of the air that was put into your GI tract during the procedure and reduce the bloating. If you had a lower endoscopy (such as a colonoscopy or flexible sigmoidoscopy) you may notice spotting of blood in your stool or on the toilet paper. If you underwent a bowel prep for your procedure, you may not have a normal bowel movement for a few days. ? ?Please Note:  You might notice some irritation and congestion in your nose or some drainage.  This is from the oxygen used during your procedure.  There is no need for concern and it should clear up in a day or so. ? ?SYMPTOMS TO REPORT IMMEDIATELY: ? ? ?Following upper endoscopy (EGD) ? Vomiting of blood or coffee ground material ? New chest pain or pain under the shoulder blades ? Painful or persistently difficult swallowing ? New shortness of breath ? Fever of 100?F or higher ? Black, tarry-looking stools ? ?For urgent or emergent issues, a gastroenterologist can be reached at any hour by calling [phone]. ?Do not use MyChart messaging for urgent concerns.  ? ? ?DIET:  We do recommend a small meal at first, but then you  may proceed to your regular diet.  Drink plenty of fluids but you should avoid alcoholic beverages for 24 hours. ? ?ACTIVITY:  You should plan to take it easy for the rest of today and you should NOT DRIVE or use heavy machinery until tomorrow (because of the sedation medicines used during the test).   ? ?FOLLOW UP: ?Our staff will call the number listed on your records 48-72 hours following your procedure to check on you and address any questions or concerns that you may have regarding the information given to you following your procedure. If we do not reach you, we will leave a message.  We will attempt to reach you two times.  During this call, we will ask if you have developed any symptoms of COVID 19. If you develop any symptoms (ie: fever, flu-like symptoms, shortness of breath, cough etc.) before then, please call [phone].  If you test positive for Covid 19 in the 2 weeks post procedure, please call and report this information to [place].   ? ?If any biopsies were taken you will be contacted by phone or by letter within the next 1-3 weeks.  Please call us at [phone] if you have not heard about the biopsies in 3 weeks.  ? ? ?SIGNATURES/CONFIDENTIALITY: ?You and/or your care partner have signed paperwork which will be entered into your electronic medical record.  These signatures attest to the fact that that the  information above on your After Visit Summary has been reviewed and is understood.  Full responsibility of the confidentiality of this discharge information lies with you and/or your care-partner.  ?

## 2021-05-30 NOTE — Progress Notes (Signed)
[facility] History and Physical ? ? ?Primary Care Physician:  Jac Canavan, PA-C ? ? ?Reason for Procedure:  Dysphagia ? ?Plan:    EGD with possible interventions as needed ? ? ? ? ?HPI: Melissa Torres is a very pleasant 36 y.o. female here for EGD for evaluation of dysphagia ?Please refer to office visit note 05/28/21. No additional changes in H&P ? ? ?The risks and benefits as well as alternatives of endoscopic procedure(s) have been discussed and reviewed. All questions answered. The patient agrees to proceed. ? ? ? ?Past Medical History:  ?Diagnosis Date  ? Eczema   ? Hypoglycemia   ? Hypothyroidism   ? Internal derangement of right knee   ? Newborn product of in vitro fertilization (IVF) pregnancy   ? Unicornate uterus affecting pregnancy   ? ? ?Past Surgical History:  ?Procedure Laterality Date  ? CESAREAN SECTION N/A 12/25/2017  ? Procedure: Primary CESAREAN SECTION;  Surgeon: Maxie Better, MD;  Location: [facility];  Service: Obstetrics;  Laterality: N/A;  EDD: 01/01/18 ?Allergy: IVP Dye, Flu Vaccine ?Karmen Stabs, RNFA  ?Cline Crock, CST ?Marrion Coy, RN ?Babs Bertin, CRNA  ? CESAREAN SECTION WITH BILATERAL TUBAL LIGATION Bilateral 02/02/2020  ? Procedure: CESAREAN SECTION WITH BILATERAL TUBAL LIGATION;  Surgeon: Olivia Mackie, MD;  Location: [facility];  Service: Obstetrics;  Laterality: Bilateral;  ? egg retrieval    ? KNEE ARTHROSCOPY Right 11/07/2012  ? Procedure: RIGHT KNEE ARTHROSCOPY PLICA RESECTION DEBRIDEMENT AND CHONDROPLASTY ;  Surgeon: Eugenia Mcalpine, MD;  Location: [facility];  Service: Orthopedics;  Laterality: Right;  ? TONSILLECTOMY AND ADENOIDECTOMY  AGE 48  ? ? ?Prior to Admission medications   ?Medication Sig Start Date End Date Taking? Authorizing Provider  ?betamethasone valerate (VALISONE) 0.1 % cream Apply topically 2 (two) times daily. 02/24/21   Tysinger, Kermit Balo, PA-C  ?levothyroxine (SYNTHROID) 50 MCG tablet Take 1 tablet by  mouth every other day, alternate with dosage. 05/26/21   Tysinger, Kermit Balo, PA-C  ?levothyroxine (SYNTHROID) 75 MCG tablet Take 1 tablet by mouth every other day, alternate with dose. 05/26/21   Tysinger, Kermit Balo, PA-C  ?Multiple Vitamin (MULTIVITAMIN) tablet Take 1 tablet by mouth daily.    [provider]  ? ? ?Current Outpatient Medications  ?Medication Sig Dispense Refill  ? betamethasone valerate (VALISONE) 0.1 % cream Apply topically 2 (two) times daily. 45 g 2  ? levothyroxine (SYNTHROID) 50 MCG tablet Take 1 tablet by mouth every other day, alternate with dosage. 45 tablet 3  ? levothyroxine (SYNTHROID) 75 MCG tablet Take 1 tablet by mouth every other day, alternate with dose. 45 tablet 3  ? Multiple Vitamin (MULTIVITAMIN) tablet Take 1 tablet by mouth daily.    ? ?Current Facility-Administered Medications  ?Medication Dose Route Frequency Provider Last Rate Last Admin  ? 0.9 %  sodium chloride infusion  500 mL Intravenous Once Kayse Puccini, Eleonore Chiquito, MD      ? ? ?Allergies as of 05/30/2021 - Review Complete 05/30/2021  ?Allergen Reaction Noted  ? Ivp dye [iodinated contrast media] Hives and Itching 03/22/2012  ? Barium-containing compounds Hives, Itching, and Other (See Comments) 12/03/2020  ? Influenza vaccines Other (See Comments) 12/25/2017  ? Other  03/26/2017  ? Prednisone  05/23/2021  ? Latex Itching and Rash 05/30/2011  ? ? ?Family History  ?Problem Relation Age of Onset  ? Hypertension Mother   ? Diabetes Mother   ? Atrial fibrillation Mother   ?  Crohn's disease Sister   ? Heart failure Maternal Grandmother   ? Diabetes Maternal Grandfather   ? Prostate cancer Maternal Grandfather   ? Colon cancer Paternal Grandmother   ? Heart disease Neg Hx   ? ? ?Social History  ? ?Socioeconomic History  ? Marital status: Married  ?  Spouse name: Not on file  ? Number of children: 2  ? Years of education: Not on file  ? Highest education level: Not on file  ?Occupational History  ?  Occupation: Nursing  ?Tobacco Use  ? Smoking status: Never  ? Smokeless tobacco: Never  ?Vaping Use  ? Vaping Use: Never used  ?Substance and Sexual Activity  ? Alcohol use: Not Currently  ? Drug use: No  ? Sexual activity: Yes  ?  Birth control/protection: None, Surgical  ?Other Topics Concern  ? Not on file  ?Social History Narrative  ? Lives with husband and 2 daughters, 27mo and 3yo.  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  Exercise - walking at [facility], trying to do some weight bearing exercise.   04/2021.    ? ?Social Determinants of Health  ? ?Financial Resource Strain: Not on file  ?Food Insecurity: Not on file  ?Transportation Needs: Not on file  ?Physical Activity: Not on file  ?Stress: Not on file  ?Social Connections: Not on file  ?Intimate Partner Violence: Not on file  ? ? ?Review of Systems: ? ?All other review of systems negative except as mentioned in the HPI. ? ?Physical Exam: ?Vital signs in last 24 hours: ?Temp 98.2 ?F (36.8 ?C)   LMP 05/15/2021  ?General:   Alert, NAD ?Lungs:  Clear .   ?Heart:  Regular rate and rhythm ?Abdomen:  Soft, nontender and nondistended. ?Neuro/Psych:  Alert and cooperative. Normal mood and affect. A and O x 3 ? ?Reviewed labs, radiology imaging, old records and pertinent past GI work up ? ?Patient is appropriate for planned procedure(s) and anesthesia in an ambulatory setting ? ? ?K. Scherry Ran , MD ?[phone]  ? ? ?; ?

## 2021-05-30 NOTE — Progress Notes (Signed)
To Pacu, VSS. Report to [email or web address] 

## 2021-05-30 NOTE — Telephone Encounter (Signed)
She was not moved or repositioned during the procedure and the time she was sedated was also very short (8 minutes), its very likely to have injury or a bruise during the short procedure. Please advise patient to contact PMD if develops worsening calf pain. Thank you ?

## 2021-05-30 NOTE — Telephone Encounter (Signed)
Called pt back to relay advice from MD and instructed her to contact her PMD if her pain continues or worsens. Pt agreeable to plan of care. She did report that her calf was still hurting but she will monitor it and f/u with PMD if needed.  ?

## 2021-05-30 NOTE — Op Note (Addendum)
[facility] ?Patient Name: Melissa Torres ?Procedure Date: 05/30/2021 8:31 AM ?MRN: [id number] ?Endoscopist: Mauri Pole , MD ?Age: 36 ?Referring MD:  ?Date of Birth: May 12, 1985 ?Gender: Female ?Account #: [id number] ?Procedure:                Upper GI endoscopy ?Indications:              Dysphagia ?Medicines:                Monitored Anesthesia Care ?Procedure:                Pre-Anesthesia Assessment: ?                          - Prior to the procedure, a History and Physical  ?                          was performed, and patient medications and  ?                          allergies were reviewed. The patient's tolerance of  ?                          previous anesthesia was also reviewed. The risks  ?                          and benefits of the procedure and the sedation  ?                          options and risks were discussed with the patient.  ?                          All questions were answered, and informed consent  ?                          was obtained. Prior Anticoagulants: The patient has  ?                          taken no previous anticoagulant or antiplatelet  ?                          agents. ASA Grade Assessment: II - A patient with  ?                          mild systemic disease. After reviewing the risks  ?                          and benefits, the patient was deemed in  ?                          satisfactory condition to undergo the procedure. ?                          After obtaining informed consent, the endoscope was  ?  passed under direct vision. Throughout the  ?                          procedure, the patient's blood pressure, pulse, and  ?                          oxygen saturations were monitored continuously. The  ?                          GIF [id number] was introduced through the  ?                          mouth, and advanced to the second part of duodenum.  ?                          The upper GI endoscopy was accomplished  without  ?                          difficulty. The patient tolerated the procedure  ?                          well. ?Scope In: ?Scope Out: ?Findings:                 LA Grade C (one or more mucosal breaks continuous  ?                          between tops of 2 or more mucosal folds, less than  ?                          75% circumference) esophagitis with no bleeding was  ?                          found 34 to 36 cm from the incisors. Biopsies were  ?                          obtained from the proximal and distal esophagus  ?                          with cold forceps for histology of suspected  ?                          eosinophilic esophagitis. Otherwise exam  ?                          unremarkable ?                          The gastroesophageal flap valve was visualized  ?                          endoscopically and classified as Hill Grade II  ?                          (fold present, opens  with respiration). ?                          The cardia and gastric fundus were normal on  ?                          retroflexion. ?                          The stomach was normal. ?                          The examined duodenum was normal. ?Complications:            No immediate complications. ?Estimated Blood Loss:     Estimated blood loss was minimal. ?Impression:               - LA Grade C reflux esophagitis with no bleeding.  ?                          Biopsied. ?                          - Gastroesophageal flap valve classified as Hill  ?                          Grade II (fold present, opens with respiration). ?                          - Normal stomach. ?                          - Normal examined duodenum. ?Recommendation:           - Patient has a contact number available for  ?                          emergencies. The signs and symptoms of potential  ?                          delayed complications were discussed with the  ?                          patient. Return to normal activities tomorrow.  ?                           Written discharge instructions were provided to the  ?                          patient. ?                          - Resume previous diet. ?                          - Continue present medications. ?                          - Await  pathology results. ?                          - Repeat upper endoscopy after studies are complete  ?                          for surveillance based on pathology results. ?                          - Return to GI clinic in 2 months, to be scheduled  ?                          with Dr Rush Landmark or APP. Follow up EGD to be  ?                          determined based on pathology and symptoms. ?                          - Follow an antireflux regimen. ?                          - Use Prevacid (lansoprazole) 30 mg PO BID for 3  ?                          months, before breakfast and dinner. ?Mauri Pole, MD ?05/30/2021 9:03:01 AM ?This report has been signed electronically. ?

## 2021-05-30 NOTE — Progress Notes (Signed)
Called to room to assist during endoscopic procedure.  Patient ID and intended procedure confirmed with present staff. Received instructions for my participation in the procedure from the performing physician.  

## 2021-06-02 ENCOUNTER — Other Ambulatory Visit: Payer: Self-pay

## 2021-06-02 ENCOUNTER — Telehealth: Payer: Self-pay

## 2021-06-02 NOTE — Telephone Encounter (Signed)
?  Follow up Call- ? ? ?  05/30/2021  ?  7:44 AM  ?Call back number  ?Post procedure Call Back phone  # [phone]  ?Permission to leave phone message Yes  ?  ? ?Patient questions: ? ?Do you have a fever, pain , or abdominal swelling? No. ?Pain Score  0 * ? ?Have you tolerated food without any problems? Yes.   ? ?Have you been able to return to your normal activities? Yes.   ? ?Do you have any questions about your discharge instructions: ?Diet   No. ?Medications  No. ?Follow up visit  No. ? ?Do you have questions or concerns about your Care? No. ? ?Actions: ?* If pain score is 4 or above: ?No action needed, pain <4. ? ? ?

## 2021-06-24 ENCOUNTER — Other Ambulatory Visit (HOSPITAL_COMMUNITY): Payer: Self-pay

## 2021-07-09 ENCOUNTER — Telehealth: Payer: Self-pay | Admitting: Internal Medicine

## 2021-07-09 DIAGNOSIS — L989 Disorder of the skin and subcutaneous tissue, unspecified: Secondary | ICD-10-CM

## 2021-07-09 DIAGNOSIS — L309 Dermatitis, unspecified: Secondary | ICD-10-CM

## 2021-07-09 NOTE — Telephone Encounter (Signed)
Tried to call pt but vm is full 

## 2021-07-09 NOTE — Telephone Encounter (Signed)
Pt called stating that she had a visit with Tollie Eth yesterday at [facility] and it was a bad experience. She did not introduce her self, told her that she had moles all over her body that it was going to take multiple visits to view all these and then decided to take a big chuck out of her back for a biopsy, at the end of the visit she told her that she was very [place]. Pt states she does not want to go back there that he bedside manner was horrible. Is it ok to refer somewhere else.

## 2021-07-09 NOTE — Telephone Encounter (Signed)
I have put in a new referral to dermatology for dermatology specialists.

## 2021-07-16 ENCOUNTER — Ambulatory Visit: Payer: No Typology Code available for payment source | Admitting: Gastroenterology

## 2021-07-22 ENCOUNTER — Encounter: Payer: Self-pay | Admitting: Gastroenterology

## 2021-07-22 ENCOUNTER — Ambulatory Visit (INDEPENDENT_AMBULATORY_CARE_PROVIDER_SITE_OTHER): Payer: No Typology Code available for payment source | Admitting: Gastroenterology

## 2021-07-22 VITALS — BP 118/70 | HR 78 | Ht 67.0 in | Wt 207.0 lb

## 2021-07-22 DIAGNOSIS — K209 Esophagitis, unspecified without bleeding: Secondary | ICD-10-CM | POA: Insufficient documentation

## 2021-07-22 DIAGNOSIS — K219 Gastro-esophageal reflux disease without esophagitis: Secondary | ICD-10-CM | POA: Diagnosis not present

## 2021-07-22 DIAGNOSIS — R1310 Dysphagia, unspecified: Secondary | ICD-10-CM

## 2021-07-22 NOTE — Progress Notes (Signed)
07/22/2021 Melissa Torres [id number] 1985/12/07   HISTORY OF PRESENT ILLNESS: This is a 36 year old female who is a patient of Dr. Elesa Hacker, her care was assigned to him when I saw her in clinic on 05/28/2021 for complaints of dysphagia and food bolus.  Please see my note from that date for events leading up to then.  She was scheduled for EGD as below.  EGD showed grade C reflux esophagitis.  Biopsy showed up to 20 eosinophils per high-power field.  She was instructed to take Prevacid 30 mg twice daily for 2 months and then have repeat EGD to assess for healing and repeat biopsies.  Question if this is all reflux and esophagitis induced or if she does truly have EOE.  She says that she does not like to take medication so she took the Prevacid for 2 weeks, but then completely changed her diet.  She has been avoiding acid reflux inducing foods as well as dairy, gluten, and soy.  She is lost 17 pounds.  She says that this has been very trying on her mentally having to think that every single thing that she puts in her mouth.  She has obviously done better with all of these dietary changes, one mild issue/episodes since her EGD.  She reports that she is also taking a probiotic and some other type of enzyme helps to neutralize the acid since she is not taking the Prevacid.  EGD 5/5 with Dr. Lavon Paganini (she performed EGD in Dr. Elesa Hacker absence):  - LA Grade C reflux esophagitis with no bleeding. Biopsied. - Gastroesophageal flap valve classified as Hill Grade II (fold present, opens with respiration). - Normal stomach. - Normal examined duodenum.  1. Surgical [P], distal esophagus and GE junction - ESOPHAGEAL SQUAMOUS AND CARDIAC MUCOSA WITH REACTIVE CHANGES - NEGATIVE FOR INTESTINAL METAPLASIA OR DYSPLASIA 2. Surgical [P], proximal esophagus - CHRONIC ESOPHAGITIS WITH MILDLY INCREASED INTRAEPITHELIAL EOSINOPHILS (UP TO 20/HIGH POWER FIELD) WITH MODERATE DEGRANULATION AND FOCAL EVIDENCE  OF SUPERFICIAL LAYERING. SEE NOTE    Past Medical History:  Diagnosis Date   Eczema    Hypoglycemia    Hypothyroidism    Internal derangement of right knee    Newborn product of in vitro fertilization (IVF) pregnancy    Unicornate uterus affecting pregnancy    Past Surgical History:  Procedure Laterality Date   CESAREAN SECTION N/A 12/25/2017   Procedure: Primary CESAREAN SECTION;  Surgeon: Maxie Better, MD;  Location: [facility];  Service: Obstetrics;  Laterality: N/A;  EDD: 01/01/18 Allergy: IVP Dye, Flu Vaccine Karmen Stabs, RNFA  Cline Crock, CST Marrion Coy, RN Babs Bertin, CRNA   CESAREAN SECTION WITH BILATERAL TUBAL LIGATION Bilateral 02/02/2020   Procedure: CESAREAN SECTION WITH BILATERAL TUBAL LIGATION;  Surgeon: Olivia Mackie, MD;  Location: [facility];  Service: Obstetrics;  Laterality: Bilateral;   egg retrieval     KNEE ARTHROSCOPY Right 11/07/2012   Procedure: RIGHT KNEE ARTHROSCOPY PLICA RESECTION DEBRIDEMENT AND CHONDROPLASTY ;  Surgeon: Eugenia Mcalpine, MD;  Location: [facility];  Service: Orthopedics;  Laterality: Right;   TONSILLECTOMY AND ADENOIDECTOMY  AGE 47    reports that she has never smoked. She has never used smokeless tobacco. She reports that she does not currently use alcohol. She reports that she does not use drugs. family history includes Atrial fibrillation in her mother; Colon cancer in her paternal grandmother; Crohn's disease in her sister; Diabetes in her maternal grandfather and mother; Heart failure in her maternal  grandmother; Hypertension in her mother; Prostate cancer in her maternal grandfather. Allergies  Allergen Reactions   Ivp Dye [Iodinated Contrast Media] Hives and Itching   Barium-Containing Compounds Hives, Itching and Other (See Comments)    Burning in throat and chest and itching all over  Hives on both feet and both hands  Took 2 doses 100 mg Benadryl to resolve symptoms    Influenza Vaccines  Other (See Comments)    Painful pustules in mouth and throat   Other     Steroids- hallucinations, anxiety CHG wipes/solutions--flares up eczema/itching   Prednisone     Oral steroids give hallucinations, but does ok with topical steroids   Latex Itching and Rash    SEVERE ITCHING      Outpatient Encounter Medications as of 07/22/2021  Medication Sig   betamethasone valerate (VALISONE) 0.1 % cream Apply topically 2 (two) times daily.   levothyroxine (SYNTHROID) 50 MCG tablet Take 1 tablet by mouth every other day, alternate with dosage.   levothyroxine (SYNTHROID) 75 MCG tablet Take 1 tablet by mouth every other day, alternate with dose.   Multiple Vitamin (MULTIVITAMIN) tablet Take 1 tablet by mouth daily.   lansoprazole (PREVACID) 30 MG capsule Take 1 capsule by mouth 2  times daily. (Patient not taking: Reported on 07/22/2021)   No facility-administered encounter medications on file as of 07/22/2021.     REVIEW OF SYSTEMS  : All other systems reviewed and negative except where noted in the History of Present Illness.   PHYSICAL EXAM: BP (!) 146/90   Pulse 78   Ht 5\' 7"  (1.702 m)   Wt 207 lb (93.9 kg)   BMI 32.42 kg/m  General: Well developed white female in no acute distress Head: Normocephalic and atraumatic Eyes:  Sclerae anicteric, conjunctiva pink. Ears: Normal auditory acuity Lungs: Clear throughout to auscultation; no W/R/R. Heart: Regular rate and rhythm; no M/R/G. Abdomen: Soft, non-distended.  BS present.  Non-tender. Musculoskeletal: Symmetrical with no gross deformities  Skin: No lesions on visible extremities Extremities: No edema  Neurological: Alert oriented x 4, grossly non-focal Psychological:  Alert and cooperative. Normal mood and affect  ASSESSMENT AND PLAN: *36 year old female with dysphagia and food bolus that led to an ER visit.  Had EGD on 5/5 with grade C esophagitis.  Biopsy showed up to 20 eosinophils per high-power field.   Question if this was all related to reflux and esophagitis versus EOE.  She was instructed to take Prevacid 30 mg twice daily for 2 months and then repeat EGD.  She took the Prevacid for 2 weeks, but does not like taking medications so instead has greatly restricted her diet, cutting out all acidic and reflux inducing foods as well as dairy, gluten, and soy with other artificial ingredients as well.  Has lost 17 pounds.  Only 1 episode/issue since her EGD.  Plan was to repeat EGD to assess for healing and repeat biopsies to determine the etiology of this.  If we think that it is EOE then she may benefit from allergy consult to try to narrow down trigger foods so that she can have less restriction on her diet.  EGD is being scheduled Dr. Meridee Score.  The risks, benefits, and alternatives to EGD were discussed with the patient and she consents to proceed.   CC:  Tysinger, Kermit Balo, PA-C

## 2021-07-23 ENCOUNTER — Telehealth: Payer: Self-pay | Admitting: Gastroenterology

## 2021-07-23 NOTE — Telephone Encounter (Signed)
error 

## 2021-07-24 ENCOUNTER — Other Ambulatory Visit (HOSPITAL_COMMUNITY): Payer: Self-pay

## 2021-07-30 ENCOUNTER — Encounter: Payer: Self-pay | Admitting: Medical

## 2021-07-30 NOTE — Progress Notes (Signed)
Attending Physician's Attestation   I have reviewed the chart.   I agree with the Advanced Practitioner's note, impression, and recommendations with any updates as below.    Ariez Neilan Mansouraty, MD [facility] Advanced Endoscopy Office # [phone]  

## 2021-07-31 ENCOUNTER — Encounter: Payer: Self-pay | Admitting: Medical

## 2021-08-01 ENCOUNTER — Encounter: Payer: No Typology Code available for payment source | Admitting: Gastroenterology

## 2021-08-04 ENCOUNTER — Other Ambulatory Visit (HOSPITAL_COMMUNITY): Payer: Self-pay

## 2021-08-05 ENCOUNTER — Other Ambulatory Visit (HOSPITAL_COMMUNITY): Payer: Self-pay

## 2021-08-06 ENCOUNTER — Other Ambulatory Visit (HOSPITAL_COMMUNITY): Payer: Self-pay

## 2021-08-08 ENCOUNTER — Other Ambulatory Visit (HOSPITAL_COMMUNITY): Payer: Self-pay

## 2021-08-12 ENCOUNTER — Encounter: Payer: Self-pay | Admitting: Gastroenterology

## 2021-08-12 ENCOUNTER — Ambulatory Visit (AMBULATORY_SURGERY_CENTER): Payer: No Typology Code available for payment source | Admitting: Gastroenterology

## 2021-08-12 VITALS — BP 117/67 | HR 55 | Temp 98.9°F | Resp 13 | Ht 67.0 in | Wt 207.0 lb

## 2021-08-12 DIAGNOSIS — K200 Eosinophilic esophagitis: Secondary | ICD-10-CM

## 2021-08-12 DIAGNOSIS — K259 Gastric ulcer, unspecified as acute or chronic, without hemorrhage or perforation: Secondary | ICD-10-CM

## 2021-08-12 DIAGNOSIS — K219 Gastro-esophageal reflux disease without esophagitis: Secondary | ICD-10-CM | POA: Diagnosis not present

## 2021-08-12 DIAGNOSIS — K2970 Gastritis, unspecified, without bleeding: Secondary | ICD-10-CM | POA: Diagnosis not present

## 2021-08-12 DIAGNOSIS — R1310 Dysphagia, unspecified: Secondary | ICD-10-CM

## 2021-08-12 DIAGNOSIS — K31A0 Gastric intestinal metaplasia, unspecified: Secondary | ICD-10-CM

## 2021-08-12 DIAGNOSIS — K2100 Gastro-esophageal reflux disease with esophagitis, without bleeding: Secondary | ICD-10-CM

## 2021-08-12 MED ORDER — SODIUM CHLORIDE 0.9 % IV SOLN: 500.0000 mL | Freq: Once | INTRAVENOUS | Status: DC

## 2021-08-12 NOTE — Patient Instructions (Signed)
YOU HAD AN ENDOSCOPIC PROCEDURE TODAY AT THE [facility]:   Refer to the procedure report that was given to you for any specific questions about what was found during the examination.  If the procedure report does not answer your questions, please call your gastroenterologist to clarify.  If you requested that your care partner not be given the details of your procedure findings, then the procedure report has been included in a sealed envelope for you to review at your convenience later.  YOU SHOULD EXPECT: Some feelings of bloating in the abdomen. Passage of more gas than usual.  Walking can help get rid of the air that was put into your GI tract during the procedure and reduce the bloating. If you had a lower endoscopy (such as a colonoscopy or flexible sigmoidoscopy) you may notice spotting of blood in your stool or on the toilet paper. If you underwent a bowel prep for your procedure, you may not have a normal bowel movement for a few days.  Please Note:  You might notice some irritation and congestion in your nose or some drainage.  This is from the oxygen used during your procedure.  There is no need for concern and it should clear up in a day or so.  SYMPTOMS TO REPORT IMMEDIATELY:  Following lower endoscopy (colonoscopy or flexible sigmoidoscopy):  Excessive amounts of blood in the stool  Significant tenderness or worsening of abdominal pains  Swelling of the abdomen that is new, acute  Fever of 100F or higher  Following upper endoscopy (EGD)  Vomiting of blood or coffee ground material  New chest pain or pain under the shoulder blades  Painful or persistently difficult swallowing  New shortness of breath  Fever of 100F or higher  Black, tarry-looking stools  For urgent or emergent issues, a gastroenterologist can be reached at any hour by calling [phone]. Do not use MyChart messaging for urgent concerns.    DIET:  We do recommend a small meal at first, but  then you may proceed to your regular diet.  Drink plenty of fluids but you should avoid alcoholic beverages for 24 hours.  ACTIVITY:  You should plan to take it easy for the rest of today and you should NOT DRIVE or use heavy machinery until tomorrow (because of the sedation medicines used during the test).    FOLLOW UP: Our staff will call the number listed on your records the next business day following your procedure.  We will call around 7:15- 8:00 am to check on you and address any questions or concerns that you may have regarding the information given to you following your procedure. If we do not reach you, we will leave a message.  If you develop any symptoms (ie: fever, flu-like symptoms, shortness of breath, cough etc.) before then, please call [phone].  If you test positive for Covid 19 in the 2 weeks post procedure, please call and report this information to us.    If any biopsies were taken you will be contacted by phone or by letter within the next 1-3 weeks.  Please call us at [phone] if you have not heard about the biopsies in 3 weeks.    SIGNATURES/CONFIDENTIALITY: You and/or your care partner have signed paperwork which will be entered into your electronic medical record.  These signatures attest to the fact that that the information above on your After Visit Summary has been reviewed and is understood.  Full responsibility of the confidentiality   of this discharge information lies with you and/or your care-partner.  

## 2021-08-12 NOTE — Progress Notes (Signed)
GASTROENTEROLOGY PROCEDURE H&P NOTE   Primary Care Physician: Jac Canavan, PA-C  HPI: Melissa Torres is a 36 y.o. female who presents for EGD for re-evaluation of dysphagia and recent EGD with esophagitis and high eosinophil count on biopsies.  Past Medical History:  Diagnosis Date   Eczema    Hypoglycemia    Hypothyroidism    Internal derangement of right knee    Newborn product of in vitro fertilization (IVF) pregnancy    Unicornate uterus affecting pregnancy    Past Surgical History:  Procedure Laterality Date   CESAREAN SECTION N/A 12/25/2017   Procedure: Primary CESAREAN SECTION;  Surgeon: Maxie Better, MD;  Location: [facility];  Service: Obstetrics;  Laterality: N/A;  EDD: 01/01/18 Allergy: IVP Dye, Flu Vaccine Karmen Stabs, RNFA  Cline Crock, CST Marrion Coy, RN Babs Bertin, CRNA   CESAREAN SECTION WITH BILATERAL TUBAL LIGATION Bilateral 02/02/2020   Procedure: CESAREAN SECTION WITH BILATERAL TUBAL LIGATION;  Surgeon: Olivia Mackie, MD;  Location: [facility];  Service: Obstetrics;  Laterality: Bilateral;   egg retrieval     KNEE ARTHROSCOPY Right 11/07/2012   Procedure: RIGHT KNEE ARTHROSCOPY PLICA RESECTION DEBRIDEMENT AND CHONDROPLASTY ;  Surgeon: Eugenia Mcalpine, MD;  Location: [facility];  Service: Orthopedics;  Laterality: Right;   TONSILLECTOMY AND ADENOIDECTOMY  AGE 29   Current Outpatient Medications  Medication Sig Dispense Refill   betamethasone valerate (VALISONE) 0.1 % cream Apply topically 2 (two) times daily. 45 g 2   lansoprazole (PREVACID) 30 MG capsule Take 1 capsule by mouth 2  times daily. (Patient not taking: Reported on 07/22/2021) 360 capsule 0   levothyroxine (SYNTHROID) 50 MCG tablet Take 1 tablet by mouth every other day, alternate with dosage. 45 tablet 3   levothyroxine (SYNTHROID) 75 MCG tablet Take 1 tablet by mouth every other day, alternate with dose. 45 tablet 3   Multiple Vitamin  (MULTIVITAMIN) tablet Take 1 tablet by mouth daily.     No current facility-administered medications for this visit.    Current Outpatient Medications:    betamethasone valerate (VALISONE) 0.1 % cream, Apply topically 2 (two) times daily., Disp: 45 g, Rfl: 2   lansoprazole (PREVACID) 30 MG capsule, Take 1 capsule by mouth 2  times daily. (Patient not taking: Reported on 07/22/2021), Disp: 360 capsule, Rfl: 0   levothyroxine (SYNTHROID) 50 MCG tablet, Take 1 tablet by mouth every other day, alternate with dosage., Disp: 45 tablet, Rfl: 3   levothyroxine (SYNTHROID) 75 MCG tablet, Take 1 tablet by mouth every other day, alternate with dose., Disp: 45 tablet, Rfl: 3   Multiple Vitamin (MULTIVITAMIN) tablet, Take 1 tablet by mouth daily., Disp: , Rfl:  Allergies  Allergen Reactions   Ivp Dye [Iodinated Contrast Media] Hives and Itching   Barium-Containing Compounds Hives, Itching and Other (See Comments)    Burning in throat and chest and itching all over  Hives on both feet and both hands  Took 2 doses 100 mg Benadryl to resolve symptoms    Influenza Vaccines Other (See Comments)    Painful pustules in mouth and throat   Other     Steroids- hallucinations, anxiety CHG wipes/solutions--flares up eczema/itching   Prednisone     Oral steroids give hallucinations, but does ok with topical steroids   Latex Itching and Rash    SEVERE ITCHING   Family History  Problem Relation Age of Onset   Hypertension Mother    Diabetes Mother  Atrial fibrillation Mother    Crohn's disease Sister    Heart failure Maternal Grandmother    Diabetes Maternal Grandfather    Prostate cancer Maternal Grandfather    Colon cancer Paternal Grandmother    Heart disease Neg Hx    Social History   Socioeconomic History   Marital status: Married    Spouse name: Not on file   Number of children: 2   Years of education: Not on file   Highest education level: Not on file  Occupational History    Occupation: Nursing  Tobacco Use   Smoking status: Never   Smokeless tobacco: Never  Vaping Use   Vaping Use: Never used  Substance and Sexual Activity   Alcohol use: Not Currently   Drug use: No   Sexual activity: Yes    Birth control/protection: None, Surgical  Other Topics Concern   Not on file  Social History Narrative   Lives with husband and 2 daughters, 65mo and 3yo.  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  Exercise - walking at [facility], trying to do some weight bearing exercise.   04/2021.     Social Determinants of Health   Financial Resource Strain: Low Risk  (12/10/2017)   Overall Financial Resource Strain (CARDIA)    Difficulty of Paying Living Expenses: Not hard at all  Food Insecurity: No Food Insecurity (12/10/2017)   Hunger Vital Sign    Worried About Running Out of Food in the Last Year: Never true    Ran Out of Food in the Last Year: Never true  Transportation Needs: Unknown (12/10/2017)   PRAPARE - Administrator, Civil Service (Medical): No    Lack of Transportation (Non-Medical): Not on file  Physical Activity: Not on file  Stress: No Stress Concern Present (12/10/2017)   [employer] of Occupational Health - Occupational Stress Questionnaire    Feeling of Stress : Only a little  Social Connections: Not on file  Intimate Partner Violence: Not At Risk (12/10/2017)   Humiliation, Afraid, Rape, and Kick questionnaire    Fear of Current or Ex-Partner: No    Emotionally Abused: No    Physically Abused: No    Sexually Abused: No    Physical Exam: There were no vitals filed for this visit. There is no height or weight on file to calculate BMI. GEN: NAD EYE: Sclerae anicteric ENT: MMM CV: Non-tachycardic GI: Soft, NT/ND NEURO:  Alert & Oriented x 3  Lab Results: No results for input(s): "WBC", "HGB", "HCT", "PLT" in the last 72 hours. BMET No results for input(s): "NA", "K", "CL", "CO2", "GLUCOSE",  "BUN", "CREATININE", "CALCIUM" in the last 72 hours. LFT No results for input(s): "PROT", "ALBUMIN", "AST", "ALT", "ALKPHOS", "BILITOT", "BILIDIR", "IBILI" in the last 72 hours. PT/INR No results for input(s): "LABPROT", "INR" in the last 72 hours.   Impression / Plan: This is a 36 y.o.female who presents for EGD for re-evaluation of dysphagia and recent EGD with esophagitis and high eosinophil count on biopsies.  The risks and benefits of endoscopic evaluation/treatment were discussed with the patient and/or family; these include but are not limited to the risk of perforation, infection, bleeding, missed lesions, lack of diagnosis, severe illness requiring hospitalization, as well as anesthesia and sedation related illnesses.  The patient's history has been reviewed, patient examined, no change in status, and deemed stable for procedure.  The patient and/or family is agreeable to proceed.    Corliss Parish, MD [facility]oscopy Office # [phone]

## 2021-08-12 NOTE — Progress Notes (Signed)
Called to room to assist during endoscopic procedure.  Patient ID and intended procedure confirmed with present staff. Received instructions for my participation in the procedure from the performing physician.  

## 2021-08-12 NOTE — Progress Notes (Signed)
Pt non-responsive, VVS, Report to RN  °

## 2021-08-12 NOTE — Progress Notes (Signed)
Vitals-AS  Pt's states no medical or surgical changes since previsit or office visit.  

## 2021-08-12 NOTE — Op Note (Signed)
[facility] Patient Name: Melissa Torres Procedure Date: 08/12/2021 10:32 AM MRN: [id number] Endoscopist: Justice Britain , MD Age: 36 Referring MD:  Date of Birth: March 04, 1985 Gender: Female Account #: [id number] Procedure:                Upper GI endoscopy Indications:              Dysphagia, Follow-up of eosinophilic esophagitis                            (unable to tolerate PPI therapy so only following                            Elimination Diet currently) Medicines:                Monitored Anesthesia Care Procedure:                Pre-Anesthesia Assessment:                           - Prior to the procedure, a History and Physical                            was performed, and patient medications and                            allergies were reviewed. The patient's tolerance of                            previous anesthesia was also reviewed. The risks                            and benefits of the procedure and the sedation                            options and risks were discussed with the patient.                            All questions were answered, and informed consent                            was obtained. Prior Anticoagulants: The patient has                            taken no previous anticoagulant or antiplatelet                            agents. ASA Grade Assessment: II - A patient with                            mild systemic disease. After reviewing the risks                            and benefits, the patient was deemed in  satisfactory condition to undergo the procedure.                           After obtaining informed consent, the endoscope was                            passed under direct vision. Throughout the                            procedure, the patient's blood pressure, pulse, and                            oxygen saturations were monitored continuously. The                            Endoscope was  introduced through the mouth, and                            advanced to the second part of duodenum. The upper                            GI endoscopy was accomplished without difficulty.                            The patient tolerated the procedure. Scope In: Scope Out: Findings:                 Mucosal changes including small circumferential                            folds were found in the entire esophagus.                            Esophageal findings were graded using the                            Eosinophilic Esophagitis Endoscopic Reference Score                            (EoE-EREFS) as: Edema Grade 0 Normal (distinct                            vascular markings), Rings Grade 1 Mild (subtle                            circumferential ridges seen on esophageal                            distension), Exudates Grade 0 None (no white                            lesions seen), Furrows Grade 1 Present (vertical                            lines with or without visible  depth) and Stricture                            none (no stricture found). Biopsies were obtained                            from the proximal and middle esophagus with cold                            forceps for histology. Biopsies were obtained from                            the distal esophagus with cold forceps for                            histology.                           LA Grade B (one or more mucosal breaks greater than                            5 mm, not extending between the tops of two mucosal                            folds) esophagitis with no bleeding was found in                            the very distal esophagus.                           The Z-line was irregular and was found 38 cm from                            the incisors.                           Localized mild inflammation characterized by                            erosions, erythema and granularity was found in the                             gastric antrum and in the prepyloric region of the                            stomach.                           No other gross lesions were noted in the entire                            examined stomach. Biopsies were taken with a cold  forceps for histology and Helicobacter pylori                            testing.                           No gross lesions were noted in the duodenal bulb,                            in the first portion of the duodenum and in the                            second portion of the duodenum. Complications:            No immediate complications. Estimated Blood Loss:     Estimated blood loss was minimal. Impression:               - Esophageal mucosal changes consistent with                            previously biopsied esophageal eosinophilia noted.                            Biopsied.                           - LA Grade B esophagitis with no bleeding distally.                           - Z-line irregular, 38 cm from the incisors.                           - Gastritis in antrum/prepylorus. No other gross                            lesions in the stomach. Biopsied.                           - No gross lesions in the duodenal bulb, in the                            first portion of the duodenum and in the second                            portion of the duodenum. Recommendation:           - The patient will be observed post-procedure,                            until all discharge criteria are met.                           - Discharge patient to home.                           - Patient has a contact number available for  emergencies. The signs and symptoms of potential                            delayed complications were discussed with the                            patient. Return to normal activities tomorrow.                            Written discharge instructions were provided to the                             patient.                           - Resume previous diet.                           - Patient did not tolerate prior PPI (arthralgias                            developed), though she has evidence of persisting                            esophagitis and also gastritis with erosions. We                            could consider trial of a different PPI or we could                            trial a H2RA - will discuss further.                           - Continue present medications.                           - Await pathology results.                           - Held off on empiric dilation after discussion                            with patient pre-procedure since she has made                            strides with the Elimination diet so far - but can                            consider in future.                           - Repeat upper endoscopy in 4 months to check                            healing.                           -  The findings and recommendations were discussed                            with the patient.                           - The findings and recommendations were discussed                            with the patient's family. Justice Britain, MD 08/12/2021 10:57:06 AM

## 2021-08-13 ENCOUNTER — Telehealth: Payer: Self-pay

## 2021-08-13 NOTE — Telephone Encounter (Signed)
  Follow up Call-     08/12/2021    9:38 AM 08/12/2021    9:34 AM 05/30/2021    7:44 AM  Call back number  Post procedure Call Back phone  # [phone]  [phone]  Permission to leave phone message  Yes Yes     Patient questions:  Do you have a fever, pain , or abdominal swelling? No. Pain Score  0 *  Have you tolerated food without any problems? Yes.    Have you been able to return to your normal activities? Yes.    Do you have any questions about your discharge instructions: Diet   No. Medications  No. Follow up visit  No.  Do you have questions or concerns about your Care? No.  Actions: * If pain score is 4 or above: No action needed, pain <4.

## 2021-08-15 ENCOUNTER — Encounter: Payer: Self-pay | Admitting: Gastroenterology

## 2021-08-15 ENCOUNTER — Other Ambulatory Visit (HOSPITAL_COMMUNITY): Payer: Self-pay

## 2021-08-15 ENCOUNTER — Other Ambulatory Visit: Payer: Self-pay

## 2021-08-15 MED ORDER — FAMOTIDINE 20 MG PO TABS
20.0000 mg | ORAL_TABLET | Freq: Two times a day (BID) | ORAL | 6 refills | Status: DC
  Filled 2021-08-15: qty 60, 30d supply, fill #0
  Filled 2021-09-25: qty 60, 30d supply, fill #1
  Filled 2021-10-31: qty 60, 30d supply, fill #2
  Filled 2021-12-05: qty 60, 30d supply, fill #3

## 2021-08-16 ENCOUNTER — Other Ambulatory Visit (HOSPITAL_COMMUNITY): Payer: Self-pay

## 2021-08-18 ENCOUNTER — Encounter: Payer: Self-pay | Admitting: Gastroenterology

## 2021-08-18 ENCOUNTER — Telehealth: Payer: Self-pay | Admitting: Gastroenterology

## 2021-08-18 NOTE — Telephone Encounter (Signed)
I spoke with the pt over the phone regarding her appt for follow up on 9/29.  She states that she spoke with Dr Meridee Score and is aware she will be getting a letter with results.  She says she has more questions for Dr Meridee Score that she does not wish to wait until the appt in Sept. To ask.  I did advise that I can take her questions and relay them to Dr Meridee Score today.  She tells me that she would rather send them to him via My Chart.  I did make her aware that I will be sure he gets those messages and I will send her his response as soon as available.  She tells me that she is not comfortable speaking with me and does not feel that I am being "receptive" to her.  I again told her that I will be happy to take her questions or she can send them via My Chart however she prefers. She then says she does not understand why she needs an appt for follow up and that she can schedule the follow up EGD over the phone and an appt is not needed.  I will pass her concerns on to Dr Meridee Score as well as any My Chart messages that may come in from the pt for him to review.

## 2021-08-18 NOTE — Telephone Encounter (Signed)
Inbound call from patient to speak with someone regarding results from procedure 08/12/21. Patient is scheduled for a follow up visit for 10/24/21. Patient would like to speak with a nurse prior to rescheduling for a sooner apt. Please give patient a call back to advise.  Thank you

## 2021-08-18 NOTE — Telephone Encounter (Signed)
I tried calling patient but it went to voicemail. If she calls back then she can just leave phone number and time when she will be available, and I will try to call her back today first later this week. Also okay to answer questions via MyChart that come up. Thanks. GM

## 2021-08-18 NOTE — Telephone Encounter (Signed)
Melissa Torres, I called and spoke with the patient this afternoon about the question she had after our recent endoscopy. Questions 1 through 5 were answered to the best of my ability. Plan of action will be to be seen in follow-up clinic and then at time of clinic, to plan repeat endoscopy scheduling so that we can follow-up how she is done on H2 RA in regards to her esophagitis and the findings of gastric intestinal metaplasia with need for gastric mapping and surveillance to be performed at follow-up endoscopy. If she has other issues that she wants to discuss further, she can certainly send a message through MyChart and we can communicate that way as necessary. Thanks. GM

## 2021-08-18 NOTE — Telephone Encounter (Signed)
My Chart questions have been sent to Dr Meridee Score to answer.

## 2021-08-21 ENCOUNTER — Encounter: Payer: Self-pay | Admitting: Gastroenterology

## 2021-09-26 ENCOUNTER — Other Ambulatory Visit (HOSPITAL_COMMUNITY): Payer: Self-pay

## 2021-09-30 ENCOUNTER — Other Ambulatory Visit (HOSPITAL_COMMUNITY): Payer: Self-pay

## 2021-10-02 ENCOUNTER — Other Ambulatory Visit (HOSPITAL_COMMUNITY): Payer: Self-pay

## 2021-10-21 ENCOUNTER — Encounter: Payer: Self-pay | Admitting: Gastroenterology

## 2021-10-21 ENCOUNTER — Ambulatory Visit (INDEPENDENT_AMBULATORY_CARE_PROVIDER_SITE_OTHER): Payer: No Typology Code available for payment source | Admitting: Gastroenterology

## 2021-10-21 VITALS — BP 118/68 | HR 68 | Ht 68.0 in | Wt 196.5 lb

## 2021-10-21 DIAGNOSIS — K219 Gastro-esophageal reflux disease without esophagitis: Secondary | ICD-10-CM

## 2021-10-21 DIAGNOSIS — K31A0 Gastric intestinal metaplasia, unspecified: Secondary | ICD-10-CM | POA: Diagnosis not present

## 2021-10-21 DIAGNOSIS — K200 Eosinophilic esophagitis: Secondary | ICD-10-CM

## 2021-10-21 NOTE — Progress Notes (Signed)
10/21/2021 ASRA GAMBREL [id number] 05-Jun-1985   HISTORY OF PRESENT ILLNESS: This is a 36 year old female who is a patient of Dr. Donneta Romberg.  She was initially seen here in May for complaints of dysphagia and follow-up of a food bolus.  Initial EGD showed grade C esophagitis and suspicion for EOE.  She was placed on Prevacid, but developed arthralgias but that so discontinued it.  She has gone completely dairy, gluten, and soy free.  She has also cut out several other things in her diet, most recently salmon and shrimp.  Her weight is down about 40 pounds.  Repeat EGD in July showed grade B esophagitis and still findings consistent with EOE.  Also had to have some focal intestinal metaplasia on her gastric biopsies.  She is now on Pepcid BID and seems to be doing well with that.  EGD 5/5 with Dr. Silverio Decamp (she performed EGD in Dr. Donneta Romberg absence):   - LA Grade C reflux esophagitis with no bleeding. Biopsied. - Gastroesophageal flap valve classified as Hill Grade II (fold present, opens with respiration). - Normal stomach. - Normal examined duodenum.   1. Surgical [P], distal esophagus and GE junction - ESOPHAGEAL SQUAMOUS AND CARDIAC MUCOSA WITH REACTIVE CHANGES - NEGATIVE FOR INTESTINAL METAPLASIA OR DYSPLASIA 2. 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 SEE NOTE   EGD 07/2021:  - Esophageal mucosal changes consistent with previously biopsied esophageal eosinophilia noted. Biopsied. - LA Grade B esophagitis with no bleeding distally. - Z-line irregular, 38 cm from the incisors. - Gastritis in antrum/prepylorus. No other gross lesions in the stomach. Biopsied. - No gross lesions in the duodenal bulb, in the first portion of the duodenum and in the second portion of the duodenum.  1. Surgical [P], distal  esophagus - EOSINOPHILIC ESOPHAGITIS PATTERN (FOCALLY UP TO 20 EOSINOPHILS/HIGH-POWER FIELD). - NEGATIVE FOR DYSPLASIA AND MALIGNANCY. - NO GLANDULAR MUCOSA PRESENT. 2. Surgical [P], mid esophagus and proximal esophagus - SQUAMOUS MUCOSA WITH FOCAL MILD REACTIVE CHANGES ASSOCIATED WITH REFLUX EFFECT (MILD BASAL HYPERPLASIA, MILD INTRASQUAMOUS LYMPHOCYTES AND RARE INTRASQUAMOUS EOSINOPHIL). - NEGATIVE FOR DYSPLASIA AND MALIGNANCY. - NO GLANDULAR MUCOSA PRESENT. 3. Surgical [P], gastric biopsies - MILD REACTIVE GASTROPATHY, WITH MILD CHRONIC INFLAMMATION AND FOCAL INTESTINAL METAPLASIA. - HELICOBACTER PYLORI IMMUNOHISTOCHEMICAL (IHC) STAIN NEGATIVE FOR ORGANISMS, WITH ADEQUATE CONTROLS. - NEGATIVE FOR MALIGNANCY.  Also, she says that she was diagnosed with and treated for Hpylori through [facility] when she was either 20 or 36 years old.  She wanted [place] to be aware of this and to see if it may have had any influence on her EGD findings.   Past Medical History:  Diagnosis Date   Eczema    Hypoglycemia    Hypothyroidism    Internal derangement of right knee    Newborn product of in vitro fertilization (IVF) pregnancy    Unicornate uterus affecting pregnancy    Past Surgical History:  Procedure Laterality Date   CESAREAN SECTION N/A 12/25/2017   Procedure: Primary CESAREAN SECTION;  Surgeon: Servando Salina, MD;  Location: [facility];  Service: Obstetrics;  Laterality: N/A;  EDD: 01/01/18 Allergy: IVP Dye, Flu Vaccine Gaylord Shih, RNFA  Waynetta Pean, CST Manon Hilding, RN Vonna Kotyk, CRNA   CESAREAN SECTION WITH BILATERAL TUBAL LIGATION Bilateral 02/02/2020   Procedure: CESAREAN SECTION WITH BILATERAL TUBAL LIGATION;  Surgeon: Brien Few, MD;  Location: [facility];  Service: Obstetrics;  Laterality: Bilateral;   egg retrieval     KNEE ARTHROSCOPY Right 11/07/2012   Procedure: RIGHT KNEE ARTHROSCOPY PLICA RESECTION DEBRIDEMENT AND CHONDROPLASTY ;  Surgeon: Sydnee Cabal,  MD;  Location: [facility];  Service: Orthopedics;  Laterality: Right;   TONSILLECTOMY AND ADENOIDECTOMY  AGE 55    reports that she has never smoked. She has never used smokeless tobacco. She reports that she does not currently use alcohol. She reports that she does not use drugs. family history includes Atrial fibrillation in her mother; Colon cancer in her paternal grandmother; Crohn's disease in her sister; Diabetes in her maternal grandfather and mother; Heart failure in her maternal grandmother; Hypertension in her mother; Prostate cancer in her maternal grandfather. Allergies  Allergen Reactions   Ivp Dye [Iodinated Contrast Media] Hives and Itching   Barium-Containing Compounds Hives, Itching and Other (See Comments)    Burning in throat and chest and itching all over  Hives on both feet and both hands  Took 2 doses 100 mg Benadryl to resolve symptoms    Influenza Vaccines Other (See Comments)    Painful pustules in mouth and throat   Other     Steroids- hallucinations, anxiety CHG wipes/solutions--flares up eczema/itching   Prednisone     Oral steroids give hallucinations, but does ok with topical steroids   Latex Itching and Rash    SEVERE ITCHING      Outpatient Encounter Medications as of 10/21/2021  Medication Sig   betamethasone valerate (VALISONE) 0.1 % cream Apply topically 2 (two) times daily.   famotidine (PEPCID) 20 MG tablet Take 1 tablet by mouth 2 times daily.   levothyroxine (SYNTHROID) 50 MCG tablet Take 1 tablet by mouth every other day, alternate with 13mcg dosage.   levothyroxine (SYNTHROID) 75 MCG tablet Take 1 tablet by mouth every other day, alternate with 67mcg dose.   Multiple Vitamin (MULTIVITAMIN) tablet Take 1 tablet by mouth daily.   No facility-administered encounter medications on file as of 10/21/2021.    REVIEW OF SYSTEMS  : All other systems reviewed and negative except where noted in the History of Present Illness.   PHYSICAL  EXAM: BP 118/68   Pulse 68   Ht 5\' 8"  (1.727 m)   Wt 196 lb 8 oz (89.1 kg)   BMI 29.88 kg/m  General: Well developed white female in no acute distress Head: Normocephalic and atraumatic Eyes:  Sclerae anicteric, conjunctiva pink. Ears: Normal auditory acuity Lungs: Clear throughout to auscultation; no W/R/R. Heart: Regular rate and rhythm; no M/R/G. Musculoskeletal: Symmetrical with no gross deformities  Skin: No lesions on visible extremities Extremities: No edema  Neurological: Alert oriented x 4, grossly non-focal Psychological:  Alert and cooperative. Normal mood and affect  ASSESSMENT AND PLAN: *36 year old female with dysphagia and food bolus that led to ER visit back in April.  Had grade C esophagitis on initial EGD in May.  Also suspicion for EOE.  Had bad reaction to Prevacid so had been on strict dietary restrictions only.  Repeat EGD in July showed grade B esophagitis and ongoing findings consistent with EOE.  Also had focal gastric intestinal metaplasia.  Is on famotidine twice daily since that procedure.  Continues with her dietary changes.  Plan was for repeat EGD at another 3 to 68-month interval with gastric mapping as well.  We will plan for this with Dr. Rush Landmark.  The risks, benefits, and alternatives to EGD were discussed with the patient and she consents to  proceed.   CC:  Tysinger, Camelia Eng, PA-C

## 2021-10-21 NOTE — Patient Instructions (Addendum)
You have been scheduled for an endoscopy. Please follow written instructions given to you at your visit today. If you use inhalers (even only as needed), please bring them with you on the day of your procedure.   Due to recent changes in healthcare laws, you may see the results of your imaging and laboratory studies on MyChart before your provider has had a chance to review them.  We understand that in some cases there may be results that are confusing or concerning to you. Not all laboratory results come back in the same time frame and the provider may be waiting for multiple results in order to interpret others.  Please give us 48 hours in order for your provider to thoroughly review all the results before contacting the office for clarification of your results.    It was a pleasure to see you today!  Thank you for trusting me with your gastrointestinal care!     

## 2021-10-21 NOTE — Progress Notes (Signed)
Attending Physician's Attestation   I have reviewed the chart.   I agree with the Advanced Practitioner's note, impression, and recommendations with any updates as below.    Dominic Rhome Mansouraty, MD [facility] Advanced Endoscopy Office # [phone]  

## 2021-10-24 ENCOUNTER — Other Ambulatory Visit (HOSPITAL_COMMUNITY): Payer: Self-pay

## 2021-10-24 ENCOUNTER — Ambulatory Visit: Payer: No Typology Code available for payment source | Admitting: Gastroenterology

## 2021-10-24 MED ORDER — FLUCONAZOLE 150 MG PO TABS
150.0000 mg | ORAL_TABLET | Freq: Once | ORAL | 0 refills | Status: AC
  Filled 2021-10-24: qty 3, 3d supply, fill #0

## 2021-10-25 ENCOUNTER — Other Ambulatory Visit (HOSPITAL_COMMUNITY): Payer: Self-pay

## 2021-10-27 ENCOUNTER — Telehealth: Payer: Self-pay

## 2021-10-27 ENCOUNTER — Other Ambulatory Visit (HOSPITAL_COMMUNITY): Payer: Self-pay

## 2021-10-27 ENCOUNTER — Ambulatory Visit (INDEPENDENT_AMBULATORY_CARE_PROVIDER_SITE_OTHER): Payer: No Typology Code available for payment source | Admitting: Medical

## 2021-10-27 VITALS — BP 110/72 | HR 84 | Temp 98.0°F | Wt 196.6 lb

## 2021-10-27 DIAGNOSIS — H6690 Otitis media, unspecified, unspecified ear: Secondary | ICD-10-CM | POA: Diagnosis not present

## 2021-10-27 DIAGNOSIS — R6889 Other general symptoms and signs: Secondary | ICD-10-CM | POA: Diagnosis not present

## 2021-10-27 DIAGNOSIS — R509 Fever, unspecified: Secondary | ICD-10-CM

## 2021-10-27 LAB — POC COVID19 BINAXNOW: SARS Coronavirus 2 Ag: NEGATIVE

## 2021-10-27 MED ORDER — LEVOTHYROXINE SODIUM 75 MCG PO TABS
ORAL_TABLET | ORAL | 3 refills | Status: DC
  Filled 2021-10-27 – 2021-10-28 (×2): qty 45, 90d supply, fill #0
  Filled 2022-01-30: qty 45, 90d supply, fill #1
  Filled 2022-04-27: qty 45, 90d supply, fill #2

## 2021-10-27 MED ORDER — AMOXICILLIN-POT CLAVULANATE 875-125 MG PO TABS
1.0000 | ORAL_TABLET | Freq: Two times a day (BID) | ORAL | 0 refills | Status: DC
  Filled 2021-10-27: qty 20, 10d supply, fill #0

## 2021-10-27 MED ORDER — LEVOTHYROXINE SODIUM 50 MCG PO TABS
ORAL_TABLET | ORAL | 3 refills | Status: DC
  Filled 2021-10-27 – 2021-10-30 (×2): qty 45, 90d supply, fill #0
  Filled 2022-01-30: qty 45, 90d supply, fill #1
  Filled 2022-04-27: qty 45, 90d supply, fill #2

## 2021-10-27 NOTE — Telephone Encounter (Signed)
Pharmacy called stating they are switching all of there levothyroxine brand from lanet to mylan just need your ok. They can be reached at [phone].

## 2021-10-27 NOTE — Progress Notes (Signed)
Subjective:  Melissa Torres is a 36 y.o. female who presents for Chief Complaint  Patient presents with   ear pain    Ear pain, hurts when swallowing. Had possible flu over the week with 104. Body aches, chills, HA      Here for illness.  Started 3 days ago with flu like symptoms.   Has had body aches, headaches, fever, chills, ear pain.  Body aches has mostly resolved.  Has had fever for 2 days.  No fever since yesterday early morning.   Had a little cough this morning.   Has had some drainage in throat this morning.  No sore throat last 2 days.   Has hx/o tonsillectomy and adenoidectomy.   Fever was as high as 104, 3 days ago, but then stayed around 101.   No sick contacts.   Did covid test 2 days ago at home that was negative.   Using Tylenol and motrin.   No other aggravating or relieving factors.    Also needs refill on thyroid medications  No other c/o.  The following portions of the patient's history were reviewed and updated as appropriate: allergies, current medications, past family history, past medical history, past social history, past surgical history and problem list.  ROS Otherwise as in subjective above  Objective: BP 110/72   Pulse 84   Temp 98 F (36.7 C)   Wt 196 lb 9.6 oz (89.2 kg)   BMI 29.89 kg/m   General appearance: alert, no distress, well developed, well nourished HEENT: normocephalic, sclerae anicteric, conjunctiva pink and moist, bilat TMs retracted and opaque, mild erythema, otherwise nares patent, no discharge or erythema, pharynx normal Oral cavity: MMM, no lesions Neck: supple, no lymphadenopathy, no thyromegaly, no masses    Assessment: Encounter Diagnoses  Name Primary?   Acute otitis media, unspecified otitis media type Yes   Fever, unspecified fever cause    Flu-like symptoms      Plan: Otitis me dia-begin Augmentin, Sudafed over-the-counter, hydrate well, nasal saline flush.  If not much better within a week then recheck  Flulike  symptoms-COVID swab negative, no history of symptoms or ear pain related at this point and the other symptoms have mostly improved.  Continue to hydrate well and rest, can continue Tylenol/Motrin alternating as desired   Melissa Torres was seen today for ear pain.  Diagnoses and all orders for this visit:  Acute otitis media, unspecified otitis media type  Fever, unspecified fever cause -     POC COVID-19  Flu-like symptoms -     POC COVID-19  Other orders -     amoxicillin-clavulanate (AUGMENTIN) 875-125 MG tablet; Take 1 tablet by mouth 2 (two) times daily. -     levothyroxine (SYNTHROID) 50 MCG tablet; Take 1 tablet by mouth every other day, alternate with 40mcg dosage. -     levothyroxine (SYNTHROID) 75 MCG tablet; Take 1 tablet by mouth every other day, alternate with 46mcg dose.    Follow up: prn

## 2021-10-28 ENCOUNTER — Other Ambulatory Visit (HOSPITAL_COMMUNITY): Payer: Self-pay

## 2021-10-30 ENCOUNTER — Other Ambulatory Visit (HOSPITAL_COMMUNITY): Payer: Self-pay

## 2021-10-31 ENCOUNTER — Other Ambulatory Visit (HOSPITAL_COMMUNITY): Payer: Self-pay

## 2021-11-01 ENCOUNTER — Encounter: Payer: Self-pay | Admitting: Certified Registered Nurse Anesthetist

## 2021-11-07 ENCOUNTER — Encounter: Payer: Self-pay | Admitting: Gastroenterology

## 2021-11-07 ENCOUNTER — Other Ambulatory Visit (HOSPITAL_COMMUNITY): Payer: Self-pay

## 2021-11-07 ENCOUNTER — Ambulatory Visit (AMBULATORY_SURGERY_CENTER): Payer: No Typology Code available for payment source | Admitting: Gastroenterology

## 2021-11-07 VITALS — BP 107/64 | HR 55 | Temp 98.4°F | Resp 11 | Ht 68.0 in | Wt 196.0 lb

## 2021-11-07 DIAGNOSIS — K319 Disease of stomach and duodenum, unspecified: Secondary | ICD-10-CM | POA: Diagnosis not present

## 2021-11-07 DIAGNOSIS — K219 Gastro-esophageal reflux disease without esophagitis: Secondary | ICD-10-CM

## 2021-11-07 DIAGNOSIS — K200 Eosinophilic esophagitis: Secondary | ICD-10-CM | POA: Diagnosis present

## 2021-11-07 MED ORDER — SODIUM CHLORIDE 0.9 % IV SOLN: 500.0000 mL | Freq: Once | INTRAVENOUS | Status: DC

## 2021-11-07 MED ORDER — MAGIC MOUTHWASH W/LIDOCAINE
5.0000 mL | Freq: Three times a day (TID) | ORAL | 0 refills | Status: DC | PRN
  Filled 2021-11-07: qty 50, 4d supply, fill #0

## 2021-11-07 MED ORDER — ONDANSETRON 8 MG PO TBDP
8.0000 mg | ORAL_TABLET | Freq: Two times a day (BID) | ORAL | 0 refills | Status: DC | PRN
  Filled 2021-11-07: qty 12, 6d supply, fill #0

## 2021-11-07 NOTE — Progress Notes (Signed)
0758 Robinul 0.1 mg IV given due large amount of secretions upon assessment.  MD made aware, vss 

## 2021-11-07 NOTE — Progress Notes (Signed)
GASTROENTEROLOGY PROCEDURE H&P NOTE   Primary Care Physician: Jac Canavan, PA-C  HPI: Melissa Torres is a 36 y.o. female who presents for EGD for follow up of esophageal eosinophilia and GIM here for mapping and biopsies.  Past Medical History:  Diagnosis Date   Eczema    Hypoglycemia    Hypothyroidism    Internal derangement of right knee    Newborn product of in vitro fertilization (IVF) pregnancy    Unicornate uterus affecting pregnancy    Past Surgical History:  Procedure Laterality Date   CESAREAN SECTION N/A 12/25/2017   Procedure: Primary CESAREAN SECTION;  Surgeon: Maxie Better, MD;  [facility]: [facility];  Service: Obstetrics;  Laterality: N/A;  EDD: 01/01/18 Allergy: IVP Dye, Flu Vaccine Karmen Stabs, RNFA  Cline Crock, CST Marrion Coy, RN Babs Bertin, CRNA   CESAREAN SECTION WITH BILATERAL TUBAL LIGATION Bilateral 02/02/2020   Procedure: CESAREAN SECTION WITH BILATERAL TUBAL LIGATION;  Surgeon: Olivia Mackie, MD;  Location: [facility];  Service: Obstetrics;  Laterality: Bilateral;   egg retrieval     KNEE ARTHROSCOPY Right 11/07/2012   Procedure: RIGHT KNEE ARTHROSCOPY PLICA RESECTION DEBRIDEMENT AND CHONDROPLASTY ;  Surgeon: Eugenia Mcalpine, MD;  Location: [facility] ;  Service: Orthopedics;  Laterality: Right;   TONSILLECTOMY AND ADENOIDECTOMY  AGE 73   Current Outpatient Medications  Medication Sig Dispense Refill   famotidine (PEPCID) 20 MG tablet Take 1 tablet by mouth 2 times daily. 60 tablet 6   levothyroxine (SYNTHROID) 50 MCG tablet Take 1 tablet by mouth every other day, alternate with dosage. 45 tablet 3   levothyroxine (SYNTHROID) 75 MCG tablet Take 1 tablet by mouth every other day, alternate with dose. 45 tablet 3   Multiple Vitamin (MULTIVITAMIN) tablet Take 1 tablet by mouth daily.     betamethasone valerate (VALISONE) 0.1 % cream Apply topically 2 (two) times daily. 45 g 2   Current  Facility-Administered Medications  Medication Dose Route Frequency Provider Last Rate Last Admin   0.9 %  sodium chloride infusion  500 mL Intravenous Once Mansouraty, Netty Starring., MD        Current Outpatient Medications:    famotidine (PEPCID) 20 MG tablet, Take 1 tablet by mouth 2 times daily., Disp: 60 tablet, Rfl: 6   levothyroxine (SYNTHROID) 50 MCG tablet, Take 1 tablet by mouth every other day, alternate with dosage., Disp: 45 tablet, Rfl: 3   levothyroxine (SYNTHROID) 75 MCG tablet, Take 1 tablet by mouth every other day, alternate with dose., Disp: 45 tablet, Rfl: 3   Multiple Vitamin (MULTIVITAMIN) tablet, Take 1 tablet by mouth daily., Disp: , Rfl:    betamethasone valerate (VALISONE) 0.1 % cream, Apply topically 2 (two) times daily., Disp: 45 g, Rfl: 2  Current Facility-Administered Medications:    0.9 %  sodium chloride infusion, 500 mL, Intravenous, Once, Mansouraty, Netty Starring., MD Allergies  Allergen Reactions   Ivp Dye [Iodinated Contrast Media] Hives and Itching   Barium-Containing Compounds Hives, Itching and Other (See Comments)    Burning in throat and chest and itching all over  Hives on both feet and both hands  Took 2 doses 100 mg Benadryl to resolve symptoms    Influenza Vaccines Other (See Comments)    Painful pustules in mouth and throat   Other     Steroids- hallucinations, anxiety CHG wipes/solutions--flares up eczema/itching   Prednisone     Oral steroids give hallucinations, but does ok with topical  steroids   Latex Itching and Rash    SEVERE ITCHING   Family History  Problem Relation Age of Onset   Hypertension Mother    Diabetes Mother    Atrial fibrillation Mother    Crohn's disease Sister    Heart failure Maternal Grandmother    Diabetes Maternal Grandfather    Prostate cancer Maternal Grandfather    Colon cancer Paternal Grandmother    Heart disease Neg Hx    Social History   Socioeconomic History   Marital status: Married     Spouse name: Not on file   Number of children: 2   Years of education: Not on file   Highest education level: Not on file  Occupational History   Occupation: Nursing  Tobacco Use   Smoking status: Never   Smokeless tobacco: Never  Vaping Use   Vaping Use: Never used  Substance and Sexual Activity   Alcohol use: Not Currently   Drug use: No   Sexual activity: Yes    Birth control/protection: None, Surgical  Other Topics Concern   Not on file  Social History Narrative   Lives with husband and 2 daughters, 23mo and 49yo.  Works at [employer], Therapist, art at [employer].  Exercise - walking at [facility], trying to do some weight bearing exercise.   04/2021.     Social Determinants of Health   Financial Resource Strain: Low Risk  (12/10/2017)   Overall Financial Resource Strain (CARDIA)    Difficulty of Paying Living Expenses: Not hard at all  Food Insecurity: No Food Insecurity (12/10/2017)   Hunger Vital Sign    Worried About Running Out of Food in the Last Year: Never true    Ran Out of Food in the Last Year: Never true  Transportation Needs: Unknown (12/10/2017)   PRAPARE - Hydrologist (Medical): No    Lack of Transportation (Non-Medical): Not on file  Physical Activity: Not on file  Stress: No Stress Concern Present (12/10/2017)   [place]    Feeling of Stress : Only a little  Social Connections: Not on file  Intimate Partner Violence: Not At Risk (12/10/2017)   Humiliation, Afraid, Rape, and Kick questionnaire    Fear of Current or Ex-Partner: No    Emotionally Abused: No    Physically Abused: No    Sexually Abused: No    Physical Exam: Today's Vitals   11/07/21 0727 11/07/21 0758 11/07/21 0800  BP: 118/79 134/85 128/84  Pulse: 75 69 64  Resp:  11 12  Temp: 98.4 F (36.9 C)    TempSrc: Temporal    SpO2: 97% 100% 100%  Weight: 196 lb (88.9  kg)    Height: 5\' 8"  (1.727 m)     Body mass index is 29.8 kg/m. GEN: NAD EYE: Sclerae anicteric ENT: MMM CV: Non-tachycardic GI: Soft, NT/ND NEURO:  Alert & Oriented x 3  Lab Results: No results for input(s): "WBC", "HGB", "HCT", "PLT" in the last 72 hours. BMET No results for input(s): "NA", "K", "CL", "CO2", "GLUCOSE", "BUN", "CREATININE", "CALCIUM" in the last 72 hours. LFT No results for input(s): "PROT", "ALBUMIN", "AST", "ALT", "ALKPHOS", "BILITOT", "BILIDIR", "IBILI" in the last 72 hours. PT/INR No results for input(s): "LABPROT", "INR" in the last 72 hours.   Impression / Plan: This is a 36 y.o.female who presents for EGD for follow up of esophageal eosinophilia and GIM here for mapping and  biopsies.  The risks and benefits of endoscopic evaluation/treatment were discussed with the patient and/or family; these include but are not limited to the risk of perforation, infection, bleeding, missed lesions, lack of diagnosis, severe illness requiring hospitalization, as well as anesthesia and sedation related illnesses.  The patient's history has been reviewed, patient examined, no change in status, and deemed stable for procedure.  The patient and/or family is agreeable to proceed.    Corliss Parish, MD [facility] Advanced Endoscopy Office # [phone]

## 2021-11-07 NOTE — Patient Instructions (Signed)
Resume previous medications.  Await results for final recommendations.  Handouts on findings given to patient.     YOU HAD AN ENDOSCOPIC PROCEDURE TODAY AT THE [facility]:   Refer to the procedure report that was given to you for any specific questions about what was found during the examination.  If the procedure report does not answer your questions, please call your gastroenterologist to clarify.  If you requested that your care partner not be given the details of your procedure findings, then the procedure report has been included in a sealed envelope for you to review at your convenience later.  YOU SHOULD EXPECT: Some feelings of bloating in the abdomen. Passage of more gas than usual.  Walking can help get rid of the air that was put into your GI tract during the procedure and reduce the bloating. If you had a lower endoscopy (such as a colonoscopy or flexible sigmoidoscopy) you may notice spotting of blood in your stool or on the toilet paper. If you underwent a bowel prep for your procedure, you may not have a normal bowel movement for a few days.  Please Note:  You might notice some irritation and congestion in your nose or some drainage.  This is from the oxygen used during your procedure.  There is no need for concern and it should clear up in a day or so.  SYMPTOMS TO REPORT IMMEDIATELY:   Following upper endoscopy (EGD)  Vomiting of blood or coffee ground material  New chest pain or pain under the shoulder blades  Painful or persistently difficult swallowing  New shortness of breath  Fever of 100F or higher  Black, tarry-looking stools  For urgent or emergent issues, a gastroenterologist can be reached at any hour by calling [phone]. Do not use MyChart messaging for urgent concerns.    DIET:  We do recommend a small meal at first, but then you may proceed to your regular diet.  Drink plenty of fluids but you should avoid alcoholic beverages for 24  hours.  ACTIVITY:  You should plan to take it easy for the rest of today and you should NOT DRIVE or use heavy machinery until tomorrow (because of the sedation medicines used during the test).    FOLLOW UP: Our staff will call the number listed on your records the next business day following your procedure.  We will call around 7:15- 8:00 am to check on you and address any questions or concerns that you may have regarding the information given to you following your procedure. If we do not reach you, we will leave a message.     If any biopsies were taken you will be contacted by phone or by letter within the next 1-3 weeks.  Please call us at [phone] if you have not heard about the biopsies in 3 weeks.    SIGNATURES/CONFIDENTIALITY: You and/or your care partner have signed paperwork which will be entered into your electronic medical record.  These signatures attest to the fact that that the information above on your After Visit Summary has been reviewed and is understood.  Full responsibility of the confidentiality of this discharge information lies with you and/or your care-partner.  

## 2021-11-07 NOTE — Progress Notes (Signed)
Report given to PACU, vss 

## 2021-11-07 NOTE — Progress Notes (Signed)
Pt's states no medical or surgical changes since previsit or office visit. 

## 2021-11-07 NOTE — Op Note (Signed)
[facility] Patient Name: Melissa Torres Procedure Date: 11/07/2021 7:57 AM MRN: [id number] Endoscopist: Gabriel Mansouraty , MD Age: 36 Referring MD:  Date of Birth: 05/27/1985 Gender: Female Account #: [id number] Procedure:                Upper GI endoscopy Indications:              Heartburn, Esophageal reflux, Follow-up of                            eosinophilic esophagitis, Follow-up of intestinal                            metaplasia Medicines:                Monitored Anesthesia Care Procedure:                Pre-Anesthesia Assessment:                           - Prior to the procedure, a History and Physical                            was performed, and patient medications and                            allergies were reviewed. The patient's tolerance of                            previous anesthesia was also reviewed. The risks                            and benefits of the procedure and the sedation                            options and risks were discussed with the patient.                            All questions were answered, and informed consent                            was obtained. Prior Anticoagulants: The patient has                            taken no previous anticoagulant or antiplatelet                            agents. ASA Grade Assessment: II - A patient with                            mild systemic disease. After reviewing the risks                            and benefits, the patient was deemed in                              satisfactory condition to undergo the procedure.                           After obtaining informed consent, the endoscope was                            passed under direct vision. Throughout the                            procedure, the patient's blood pressure, pulse, and                            oxygen saturations were monitored continuously. The                            GIF [id number] #[id number] was introduced through  the                            mouth, and advanced to the second part of duodenum.                            The upper GI endoscopy was accomplished without                            difficulty. The patient tolerated the procedure. Scope In: Scope Out: Findings:                 No gross mucosal lesions were noted in the entire                            esophagus. With a history of prior esophageal                            eosinophilia and with the patient being on a H2RA,                            we wanted to see what effect had occurred in                            regards to her pathology. Thus biopsies were taken                            with a cold forceps for histology from the proximal                            and middle esophagus and placed in a jar and then                            biopsies were taken with a cold forceps for                            histology from the distal esophagus and placed in                              another jar.                           The Z-line was regular and was found 40 cm from the                            incisors.                           Localized mildly erythematous mucosa without                            bleeding was found in the gastric antrum and in the                            prepyloric region of the stomach.                           No other gross lesions were noted in the entire                            examined stomach. With a history of intestinal                            metaplasia being found previously on gastric                            biopsies, gastric mapping was performed today.                            Biopsies were taken with a cold forceps for                            histology from the cardia and fundus. Biopsies were                            taken with a cold forceps for histology from the                            greater curve and lesser curve. Biopsies were taken                             with a cold forceps for histology from the incisura                            and antrum.                           No gross lesions were noted in the duodenal bulb,                            in the first portion of the duodenum and in the  second portion of the duodenum. Complications:            No immediate complications. Estimated Blood Loss:     Estimated blood loss was minimal. Impression:               - No gross lesions in esophagus. Biopsied to                            evaluate response to acid suppression in setting of                            esophageal eosinophilia.                           - Z-line regular, 40 cm from the incisors.                           - Mild erythematous mucosa in the antrum and                            prepyloric region of the stomach. No other gross                            lesions in the stomach. Gastric mapping biopsies                            performed                           - No gross lesions in the duodenal bulb, in the                            first portion of the duodenum and in the second                            portion of the duodenum. Recommendation:           - The patient will be observed post-procedure,                            until all discharge criteria are met.                           - Discharge patient to home.                           - Patient has a contact number available for                            emergencies. The signs and symptoms of potential                            delayed complications were discussed with the                            patient. Return to  normal activities tomorrow.                            Written discharge instructions were provided to the                            patient.                           - Resume previous diet.                           - Continue present medications.                           - Await pathology results.                            - Consider potential further dietary modification                            pending pathology of esophagus.                           - Repeat upper endoscopy in likely 3 years for                            surveillance in setting of intestinal metaplasia.                           - The findings and recommendations were discussed                            with the patient.                           - The findings and recommendations were discussed                            with the patient's family. Justice Britain, MD 11/07/2021 8:34:12 AM

## 2021-11-07 NOTE — Progress Notes (Signed)
Called to room to assist during endoscopic procedure.  Patient ID and intended procedure confirmed with present staff. Received instructions for my participation in the procedure from the performing physician.  

## 2021-11-10 ENCOUNTER — Telehealth: Payer: Self-pay | Admitting: *Deleted

## 2021-11-10 NOTE — Telephone Encounter (Signed)
  Follow up Call-     11/07/2021    7:28 AM 08/12/2021    9:38 AM 08/12/2021    9:34 AM 05/30/2021    7:44 AM  Call back number  Post procedure Call Back phone  # [phone]  [phone]  Permission to leave phone message Yes  Yes Yes     Patient questions:  Do you have a fever, pain , or abdominal swelling? No. Pain Score  0 *  Have you tolerated food without any problems? Yes.    Have you been able to return to your normal activities? Yes.    Do you have any questions about your discharge instructions: Diet   No. Medications  No. Follow up visit  No.  Do you have questions or concerns about your Care? No.  Actions: * If pain score is 4 or above: No action needed, pain <4.

## 2021-11-11 ENCOUNTER — Encounter: Payer: Self-pay | Admitting: Gastroenterology

## 2021-11-19 ENCOUNTER — Ambulatory Visit: Payer: No Typology Code available for payment source | Admitting: Gastroenterology

## 2021-11-25 ENCOUNTER — Other Ambulatory Visit (HOSPITAL_COMMUNITY): Payer: Self-pay

## 2021-11-25 MED ORDER — MELOXICAM 7.5 MG PO TABS
7.5000 mg | ORAL_TABLET | Freq: Two times a day (BID) | ORAL | 2 refills | Status: DC
  Filled 2021-11-25: qty 60, 30d supply, fill #0

## 2021-12-05 ENCOUNTER — Other Ambulatory Visit (HOSPITAL_COMMUNITY): Payer: Self-pay

## 2021-12-29 ENCOUNTER — Telehealth: Payer: Self-pay | Admitting: Internal Medicine

## 2021-12-29 ENCOUNTER — Other Ambulatory Visit (HOSPITAL_COMMUNITY): Payer: Self-pay

## 2021-12-29 MED ORDER — FAMOTIDINE 20 MG PO TABS
20.0000 mg | ORAL_TABLET | Freq: Two times a day (BID) | ORAL | 1 refills | Status: DC
  Filled 2021-12-29: qty 180, 90d supply, fill #0
  Filled 2022-04-17: qty 180, 90d supply, fill #1

## 2021-12-29 NOTE — Telephone Encounter (Signed)
Pt called and would like a refill on her pepcid.

## 2022-01-08 ENCOUNTER — Encounter: Payer: Self-pay | Admitting: Family Medicine

## 2022-01-08 ENCOUNTER — Ambulatory Visit (INDEPENDENT_AMBULATORY_CARE_PROVIDER_SITE_OTHER): Payer: No Typology Code available for payment source | Admitting: Family Medicine

## 2022-01-08 ENCOUNTER — Other Ambulatory Visit (HOSPITAL_COMMUNITY): Payer: Self-pay

## 2022-01-08 VITALS — BP 108/70 | HR 68 | Temp 97.2°F | Ht 68.0 in | Wt 193.4 lb

## 2022-01-08 DIAGNOSIS — H6993 Unspecified Eustachian tube disorder, bilateral: Secondary | ICD-10-CM | POA: Diagnosis not present

## 2022-01-08 DIAGNOSIS — R110 Nausea: Secondary | ICD-10-CM | POA: Diagnosis not present

## 2022-01-08 LAB — POCT URINE PREGNANCY: Preg Test, Ur: NEGATIVE

## 2022-01-08 MED ORDER — ONDANSETRON 4 MG PO TBDP
4.0000 mg | ORAL_TABLET | Freq: Three times a day (TID) | ORAL | 0 refills | Status: DC | PRN
  Filled 2022-01-08: qty 15, 5d supply, fill #0

## 2022-01-08 NOTE — Patient Instructions (Signed)
Eustachian tube dysfunction.  I recommend using an antihistamine (since these looks more allergic in nature and there are no cold symptoms).  Use claritin or allegra or zyrtec, once daily. If your ears becomes more painful, or this isn't working, you can add pseudoephedrine (decongestant).  Nausea--unclear etiology.  Your pregnancy test was negative (but feel free to repeat if your period doesn't come or if you develop any pain). Reflux could be a factor.  Consider trying a PPI for 2 weeks in place of the pepcid. You can start with over-the-counter Prilosec and increase to 40mg  daily, if needed.  Or you can discuss with your GI. I'm sending in a prescription for zofran to use if needed for severe nausea. Continue the peppermint oil and/or ginger to help with nausea, if needed.

## 2022-01-08 NOTE — Progress Notes (Signed)
Chief Complaint  Patient presents with   Ear Pain    B/L ear stuffiness, feels like she is in an airplane. Took home covid test this am and it was negative. She is super nauseous. States she cannot be sure she is not pregnant.   Last night her ears started feeling congested--feels like she is in an airplane. Occasional sharp sensations, but overall not painful. +plugging/popping. No sniffle/sneeze/runny nose, itchy eyes.  Denies sore throat, PND.  Over 1 week of nausea, intermittently at first, constant over the last week. Not related to eating. Feels better laying down. Missed work yesterday and today due to nausea. She had severe pain in LLQ a few weeks ago, resolved after 45 minutes, hasn't recurred. Has had tubal ligation (with her c-section 2 years ago, to the tube on the L, connected to her uterus.)  LMP was 10/25, late.  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 Sees Dr. Meridee Score. Denies heartburn, taking pepcid BID. Follows elimination diet carefully. She previously had hip and joint pain with PPI (prevacid 30mg , hasn't tried others) She prefers using essential oils over medications.  PMH, PSH, SH reviewed  OR nurse  Outpatient Encounter Medications as of 01/08/2022  Medication Sig Note   famotidine (PEPCID) 20 MG tablet Take 1 tablet (20 mg total) by mouth 2 (two) times daily.    levothyroxine (SYNTHROID) 50 MCG tablet Take 1 tablet by mouth every other day, alternate with 01/10/2022 dosage.    levothyroxine (SYNTHROID) 75 MCG tablet Take 1 tablet by mouth every other day, alternate with dose.    Multiple Vitamin (MULTIVITAMIN) tablet Take 1 tablet by mouth daily.    ondansetron (ZOFRAN-ODT) 4 MG disintegrating tablet Dissolve 1 tablet (4 mg total) by mouth every 8 (eight) hours as needed for nausea or vomiting.    [DISCONTINUED] magic mouthwash w/lidocaine SOLN Take 5 mLs by mouth 3 (three) times daily as needed for mouth pain.    [DISCONTINUED] meloxicam  (MOBIC) 7.5 MG tablet Take 1 tablet (7.5 mg total) by mouth 2 (two) times daily with a meal. (Patient not taking: Reported on 01/08/2022)    [DISCONTINUED] ondansetron (ZOFRAN-ODT) 8 MG disintegrating tablet Take 1 tablet (8 mg total) by mouth every 12 (twelve) hours as needed for nausea or vomiting. (Patient not taking: Reported on 01/08/2022) 01/08/2022: prn   Facility-Administered Encounter Medications as of 01/08/2022  Medication   0.9 %  sodium chloride infusion   NOT taking zofran prior to today's visit. States she never filled the 8mg  zofran dose or the magic mouthwash (rx'd at her EGD).  Allergies  Allergen Reactions   Ivp Dye [Iodinated Contrast Media] Hives and Itching   Barium-Containing Compounds Hives, Itching and Other (See Comments)    Burning in throat and chest and itching all over  Hives on both feet and both hands  Took 2 doses 100 mg Benadryl to resolve symptoms    Influenza Vaccines Other (See Comments)    Painful pustules in mouth and throat   Other     Steroids- hallucinations, anxiety CHG wipes/solutions--flares up eczema/itching   Prednisone     Oral steroids give hallucinations, but does ok with topical steroids   Latex Itching and Rash    SEVERE ITCHING   ROS: no fever, chills, URI symptoms.  Ear plugging per HPI.  +nausea per HPI, late menses.  No current abdominal pain, no vomiting or diarrhea. No chest pain, shortness of breath or other concerns.   PHYSICAL EXAM:  BP 108/70   Pulse 68   Temp (!) 97.2 F (36.2 C) (Tympanic)   Ht 5\' 8"  (1.727 m)   Wt 193 lb 6.4 oz (87.7 kg)   LMP 11/19/2021   BMI 29.41 kg/m   Pleasant, well-appearing female in no distress HEENT: conjunctiva and sclera are clear, EOMI. Nasal mucosa is mildly edematous and pale, with clear mucus noted on the left. TM's and EAC's are normal, no cerumen present. OP is clear. Sinuses are nontender Neck: no lymphadenopathy or mass Heart: regular rate and rhythm Lungs: clear  bilaterally Abdomen: soft, nontender, no organomegaly or mass Extremities: no edema Skin: no visible rashes Neuro: alert and oriented, cranial nerves grossly intact Psych: normal mood, affect, hygiene and grooming  Urine pregnancy test negative   ASSESSMENT/PLAN:  Dysfunction of both eustachian tubes - suspect related to allergies based on exam. Discussed use of antihistamine, +/- decongestant  Nausea - unclear etiology.  urine pregnancy test negative. Trial of zofran prn (since missing work). Suggested PPI over H2 blocker to see if more helpful - Plan: POCT urine pregnancy, ondansetron (ZOFRAN-ODT) 4 MG disintegrating tablet  I spent 32 minutes dedicated to the care of this patient, including pre-visit review of records, face to face time, post-visit ordering of testing and documentation.   I recommend using an antihistamine (since these looks more allergic in nature and there are no cold symptoms).  Use claritin or allegra or zyrtec, once daily. If your ears becomes more painful, or this isn't working, you can add pseudoephedrine (decongestant).  Nausea--unclear etiology.  Your pregnancy test was negative (but feel free to repeat if your period doesn't come or if you develop any pain). Reflux could be a factor.  Consider trying a PPI for 2 weeks in place of the pepcid. You can start with over-the-counter Prilosec and increase to 40mg  daily, if needed.  Or you can discuss with your GI. I'm sending in a prescription for zofran to use if needed for severe nausea. Continue the peppermint oil and/or ginger to help with nausea, if needed.

## 2022-01-20 ENCOUNTER — Other Ambulatory Visit (HOSPITAL_COMMUNITY): Payer: Self-pay

## 2022-01-30 ENCOUNTER — Other Ambulatory Visit (HOSPITAL_COMMUNITY): Payer: Self-pay

## 2022-04-18 ENCOUNTER — Other Ambulatory Visit (HOSPITAL_COMMUNITY): Payer: Self-pay

## 2022-04-27 ENCOUNTER — Other Ambulatory Visit: Payer: Self-pay

## 2022-05-26 ENCOUNTER — Encounter: Payer: No Typology Code available for payment source | Admitting: Medical

## 2022-07-09 ENCOUNTER — Encounter: Payer: 59 | Admitting: Medical

## 2022-07-09 DIAGNOSIS — R142 Eructation: Secondary | ICD-10-CM

## 2022-07-09 DIAGNOSIS — E039 Hypothyroidism, unspecified: Secondary | ICD-10-CM

## 2022-07-09 DIAGNOSIS — Z0000 Encounter for general adult medical examination without abnormal findings: Secondary | ICD-10-CM

## 2022-07-10 ENCOUNTER — Other Ambulatory Visit (HOSPITAL_COMMUNITY): Payer: Self-pay

## 2022-07-10 ENCOUNTER — Ambulatory Visit (INDEPENDENT_AMBULATORY_CARE_PROVIDER_SITE_OTHER): Payer: 59 | Admitting: Medical

## 2022-07-10 ENCOUNTER — Encounter: Payer: Self-pay | Admitting: Medical

## 2022-07-10 VITALS — BP 110/74 | HR 64 | Ht 68.5 in | Wt 208.8 lb

## 2022-07-10 DIAGNOSIS — E039 Hypothyroidism, unspecified: Secondary | ICD-10-CM | POA: Diagnosis not present

## 2022-07-10 DIAGNOSIS — Z0000 Encounter for general adult medical examination without abnormal findings: Secondary | ICD-10-CM | POA: Diagnosis not present

## 2022-07-10 DIAGNOSIS — L309 Dermatitis, unspecified: Secondary | ICD-10-CM

## 2022-07-10 DIAGNOSIS — E7800 Pure hypercholesterolemia, unspecified: Secondary | ICD-10-CM | POA: Diagnosis not present

## 2022-07-10 DIAGNOSIS — K200 Eosinophilic esophagitis: Secondary | ICD-10-CM | POA: Diagnosis not present

## 2022-07-10 DIAGNOSIS — K219 Gastro-esophageal reflux disease without esophagitis: Secondary | ICD-10-CM | POA: Diagnosis not present

## 2022-07-10 LAB — CBC WITH DIFFERENTIAL/PLATELET
Basophils Absolute: 0 10*3/uL (ref 0.0–0.2)
Eos: 6 %
Hemoglobin: 13 g/dL (ref 11.1–15.9)
Lymphocytes Absolute: 2.3 10*3/uL (ref 0.7–3.1)
Monocytes Absolute: 0.5 10*3/uL (ref 0.1–0.9)
RDW: 12.5 % (ref 11.7–15.4)

## 2022-07-10 LAB — T3

## 2022-07-10 LAB — LIPID PANEL

## 2022-07-10 LAB — CMP14+EGFR

## 2022-07-10 LAB — MAGNESIUM

## 2022-07-10 LAB — VITAMIN B12

## 2022-07-10 LAB — HEMOGLOBIN A1C: Est. average glucose Bld gHb Est-mCnc: 105 mg/dL

## 2022-07-10 MED ORDER — FAMOTIDINE 20 MG PO TABS
20.0000 mg | ORAL_TABLET | Freq: Two times a day (BID) | ORAL | 3 refills | Status: DC
  Filled 2022-07-10 – 2022-07-21 (×2): qty 180, 90d supply, fill #0
  Filled 2022-10-19: qty 180, 90d supply, fill #1
  Filled 2023-01-22 (×2): qty 180, 90d supply, fill #2
  Filled 2023-04-27: qty 180, 90d supply, fill #3

## 2022-07-10 NOTE — Progress Notes (Signed)
Subjective:   HPI  Melissa Torres is a 37 y.o. female who presents for Chief Complaint  Patient presents with   Annual Exam    Fasting cpe, no concerns.     Patient Care Team: Lempi Edwin, Cleda Mccreedy as PCP - General (Family Medicine) Sees dentist Gynecology, Dr. Ocie Cornfield Dr. Corliss Parish, GI   Concerns: Thyroid Problem Presents for follow-up (no concerns.  uses 2 doses that she rotates daily) visit. Patient reports no anxiety, constipation, diarrhea, palpitations or weight loss.   Has eosinophilic esophagitis, uses pepcid, but doesn't like ot take anything.  Diet has been a struggle with this.  Worse with gluten and dairy.   Reviewed their medical, surgical, family, social, medication, and allergy history and updated chart as appropriate.  Past Medical History:  Diagnosis Date   Eczema    Eosinophilic esophagitis 2023   Hypoglycemia    Hypothyroidism    Internal derangement of right knee    Newborn product of in vitro fertilization (IVF) pregnancy    Unicornate uterus affecting pregnancy     Family History  Problem Relation Age of Onset   Hypertension Mother    Diabetes Mother    Atrial fibrillation Mother    Crohn's disease Sister    Heart failure Maternal Grandmother    Diabetes Maternal Grandfather    Prostate cancer Maternal Grandfather    Colon cancer Paternal Grandmother    Heart disease Neg Hx    Past Surgical History:  Procedure Laterality Date   CESAREAN SECTION N/A 12/25/2017   Procedure: Primary CESAREAN SECTION;  Surgeon: Maxie Better, MD;  Location: [facility];  Service: Obstetrics;  Laterality: N/A;  EDD: 01/01/18 Allergy: IVP Dye, Flu Vaccine Karmen Stabs, RNFA  Cline Crock, CST Marrion Coy, RN Babs Bertin, CRNA   CESAREAN SECTION WITH BILATERAL TUBAL LIGATION Bilateral 02/02/2020   Procedure: CESAREAN SECTION WITH BILATERAL TUBAL LIGATION;  Surgeon: Olivia Mackie, MD;  Location: [facility];  Service:  Obstetrics;  Laterality: Bilateral;   egg retrieval     ESOPHAGOGASTRODUODENOSCOPY  2023   multiple in 2023   KNEE ARTHROSCOPY Right 11/07/2012   Procedure: RIGHT KNEE ARTHROSCOPY PLICA RESECTION DEBRIDEMENT AND CHONDROPLASTY ;  Surgeon: Eugenia Mcalpine, MD;  Location: [facility];  Service: Orthopedics;  Laterality: Right;   TONSILLECTOMY AND ADENOIDECTOMY  AGE 48      Current Outpatient Medications:    levothyroxine (SYNTHROID) 50 MCG tablet, Take 1 tablet by mouth every other day, alternate with dosage., Disp: 45 tablet, Rfl: 3   levothyroxine (SYNTHROID) 75 MCG tablet, Take 1 tablet by mouth every other day, alternate with dose., Disp: 45 tablet, Rfl: 3   Multiple Vitamin (MULTIVITAMIN) tablet, Take 1 tablet by mouth daily., Disp: , Rfl:    famotidine (PEPCID) 20 MG tablet, Take 1 tablet (20 mg total) by mouth 2 (two) times daily., Disp: 180 tablet, Rfl: 3  Allergies  Allergen Reactions   Ivp Dye [Iodinated Contrast Media] Hives and Itching   Barium-Containing Compounds Hives, Itching and Other (See Comments)    Burning in throat and chest and itching all over  Hives on both feet and both hands  Took 2 doses 100 mg Benadryl to resolve symptoms    Influenza Vaccines Other (See Comments)    Painful pustules in mouth and throat   Other     Steroids- hallucinations, anxiety CHG wipes/solutions--flares up eczema/itching   Prednisone     Oral steroids give hallucinations, but does ok  with topical steroids   Latex Itching and Rash    SEVERE ITCHING    Review of Systems  Constitutional:  Negative for chills, fever, malaise/fatigue and weight loss.  HENT:  Negative for congestion, ear pain, hearing loss, sore throat and tinnitus.   Eyes:  Negative for blurred vision, pain and redness.  Respiratory:  Negative for cough, hemoptysis and shortness of breath.   Cardiovascular:  Negative for chest pain, palpitations, orthopnea, claudication and leg swelling.   Gastrointestinal:  Negative for abdominal pain, blood in stool, constipation, diarrhea, nausea and vomiting.       Chest tightness with eosinophilic esophagitis  Genitourinary:  Negative for dysuria, flank pain, frequency, hematuria and urgency.  Musculoskeletal:  Negative for falls, joint pain and myalgias.  Skin:  Negative for itching and rash.  Neurological:  Negative for dizziness, tingling, speech change, weakness and headaches.  Endo/Heme/Allergies:  Negative for polydipsia. Does not bruise/bleed easily.  Psychiatric/Behavioral:  Negative for depression and memory loss. The patient is not nervous/anxious and does not have insomnia.         10/27/2021    8:43 AM 05/23/2021    8:27 AM 05/21/2020    8:27 AM 03/15/2019    8:20 AM 02/25/2018   10:54 AM  Depression screen PHQ 2/9  Decreased Interest 0 0 0 0   Down, Depressed, Hopeless 0 0 0 0   PHQ - 2 Score 0 0 0 0      Information is confidential and restricted. Go to Review Flowsheets to unlock data.       Objective:  BP 110/74   Pulse 64   Ht 5' 8.5" (1.74 m)   Wt 208 lb 12.8 oz (94.7 kg)   BMI 31.29 kg/m   Wt Readings from Last 3 Encounters:  07/10/22 208 lb 12.8 oz (94.7 kg)  01/08/22 193 lb 6.4 oz (87.7 kg)  11/07/21 196 lb (88.9 kg)    General appearance: alert, no distress, WD/WN, Caucasian female Skin: right face lower with solar keratosis, other faint solar keratoses on left cheek, no other worrisome lesions HEENT: normocephalic, conjunctiva/corneas normal, sclerae anicteric, PERRLA, EOMi, nares patent, no discharge or erythema, pharynx normal Oral cavity: MMM, tongue normal, teeth normal Neck: supple, no lymphadenopathy, no thyromegaly, no masses, normal ROM, no bruits Chest: non tender, normal shape and expansion Heart: RRR, normal S1, S2, no murmurs Lungs: CTA bilaterally, no wheezes, rhonchi, or rales Abdomen: +bs, soft, small reducible umbilical hernia, non tender, non distended, no masses, no  hepatomegaly, no splenomegaly, no bruits Back: non tender, normal ROM, no scoliosis Musculoskeletal: upper extremities non tender, no obvious deformity, normal ROM throughout, lower extremities non tender, no obvious deformity, normal ROM throughout Extremities: no edema, no cyanosis, no clubbing Pulses: 2+ symmetric, upper and lower extremities, normal cap refill Neurological: alert, oriented x 3, CN2-12 intact, strength normal upper extremities and lower extremities, sensation normal throughout, DTRs 2+ throughout, no cerebellar signs, gait normal Psychiatric: normal affect, behavior normal, pleasant  Breast/gyn/rectal - deferred to gynecology    Assessment and Plan :   Encounter Diagnoses  Name Primary?   Routine general medical examination at a health care facility Yes   Eczema, unspecified type    Eosinophilic esophagitis    Hypothyroidism, unspecified type    Elevated LDL cholesterol level    Gastroesophageal reflux disease, unspecified whether esophagitis present     This visit was a preventative care visit, also known as wellness visit or routine physical.   Topics typically include  healthy lifestyle, diet, exercise, preventative care, vaccinations, sick and well care, proper use of emergency dept and after hours care, as well as other concerns.     Recommendations: Continue to return yearly for your annual wellness and preventative care visits.  This gives [place] a chance to discuss healthy lifestyle, exercise, vaccinations, review your chart record, and perform screenings where appropriate.  I recommend you see your eye doctor yearly for routine vision care.  I recommend you see your dentist yearly for routine dental care including hygiene visits twice yearly.  See your gynecologist yearly for routine gynecological care.   Vaccination recommendations were reviewed Immunization History  Administered Date(s) Administered   Tdap 10/27/2017, 01/03/2020     Screening for  cancer: Colon cancer screening: Age 90  Breast cancer screening: You should perform a self breast exam monthly.   We reviewed recommendations for regular mammograms and breast cancer screening.  Cervical cancer screening: We reviewed recommendations for pap smear screening.   Skin cancer screening: Check your skin regularly for new changes, growing lesions, or other lesions of concern Come in for evaluation if you have skin lesions of concern.  Lung cancer screening: If you have a greater than 20 pack year history of tobacco use, then you may qualify for lung cancer screening with a chest CT scan.   Please call your insurance company to inquire about coverage for this test.  We currently don't have screenings for other cancers besides breast, cervical, colon, and lung cancers.  If you have a strong family history of cancer or have other cancer screening concerns, please let me know.    Bone health: Get at least 150 minutes of aerobic exercise weekly Get weight bearing exercise at least once weekly Bone density test:  A bone density test is an imaging test that uses a type of X-ray to measure the amount of calcium and other minerals in your bones. The test may be used to diagnose or screen you for a condition that causes weak or thin bones (osteoporosis), predict your risk for a broken bone (fracture), or determine how well your osteoporosis treatment is working. The bone density test is recommended for females 65 and older, or females or males <65 if certain risk factors such as thyroid disease, long term use of steroids such as for asthma or rheumatological issues, vitamin D deficiency, estrogen deficiency, family history of osteoporosis, self or family history of fragility fracture in first degree relative.    Heart health: Get at least 150 minutes of aerobic exercise weekly Limit alcohol It is important to maintain a healthy blood pressure and healthy cholesterol numbers  Heart  disease screening: Screening for heart disease includes screening for blood pressure, fasting lipids, glucose/diabetes screening, BMI height to weight ratio, reviewed of smoking status, physical activity, and diet.    Goals include blood pressure 120/80 or less, maintaining a healthy lipid/cholesterol profile, preventing diabetes or keeping diabetes numbers under good control, not smoking or using tobacco products, exercising most days per week or at least 150 minutes per week of exercise, and eating healthy variety of fruits and vegetables, healthy oils, and avoiding unhealthy food choices like fried food, fast food, high sugar and high cholesterol foods.    Other tests may possibly include EKG test, CT coronary calcium score, echocardiogram, exercise treadmill stress test.    Medical care options: I recommend you continue to seek care here first for routine care.  We try really hard to have available appointments Monday through  Friday daytime hours for sick visits, acute visits, and physicals.  Urgent care should be used for after hours and weekends for significant issues that cannot wait till the next day.  The emergency department should be used for significant potentially life-threatening emergencies.  The emergency department is expensive, can often have long wait times for less significant concerns, so try to utilize primary care, urgent care, or telemedicine when possible to avoid unnecessary trips to the emergency department.  Virtual visits and telemedicine have been introduced since the pandemic started in 2020, and can be convenient ways to receive medical care.  We offer virtual appointments as well to assist you in a variety of options to seek medical care.   Separate significant issues discussed: Problem List Items Addressed This Visit     Hypothyroidism   Relevant Orders   TSH   T4, Free   T3   Elevated LDL cholesterol level   Relevant Orders   Lipid panel   Eczema    Gastroesophageal reflux disease   Relevant Medications   famotidine (PEPCID) 20 MG tablet   Eosinophilic esophagitis   Relevant Orders   Hemoglobin A1c   Vitamin B12   VITAMIN D 25 Hydroxy (Vit-D Deficiency, Fractures)   Magnesium   Other Visit Diagnoses     Routine general medical examination at a health care facility    -  Primary   Relevant Orders   CBC with Differential/Platelet   CMP14+EGFR   T3      Follow-up pending labs, yearly for physical

## 2022-07-11 LAB — CBC WITH DIFFERENTIAL/PLATELET
Basos: 1 %
EOS (ABSOLUTE): 0.4 10*3/uL (ref 0.0–0.4)
Hematocrit: 38.8 % (ref 34.0–46.6)
Immature Grans (Abs): 0 10*3/uL (ref 0.0–0.1)
Immature Granulocytes: 0 %
Lymphs: 37 %
MCH: 29.6 pg (ref 26.6–33.0)
MCHC: 33.5 g/dL (ref 31.5–35.7)
MCV: 88 fL (ref 79–97)
Monocytes: 8 %
Neutrophils Absolute: 3 10*3/uL (ref 1.4–7.0)
Neutrophils: 48 %
Platelets: 254 10*3/uL (ref 150–450)
RBC: 4.39 x10E6/uL (ref 3.77–5.28)
WBC: 6.2 10*3/uL (ref 3.4–10.8)

## 2022-07-11 LAB — CMP14+EGFR
ALT: 10 IU/L (ref 0–32)
AST: 10 IU/L (ref 0–40)
Albumin/Globulin Ratio: 2
Albumin: 4.5 g/dL (ref 3.9–4.9)
Alkaline Phosphatase: 80 IU/L (ref 44–121)
BUN/Creatinine Ratio: 16 (ref 9–23)
BUN: 9 mg/dL (ref 6–20)
CO2: 27 mmol/L (ref 20–29)
Calcium: 9.4 mg/dL (ref 8.7–10.2)
Chloride: 104 mmol/L (ref 96–106)
Globulin, Total: 2.2 g/dL (ref 1.5–4.5)
Potassium: 4.4 mmol/L (ref 3.5–5.2)
Sodium: 142 mmol/L (ref 134–144)
Total Protein: 6.7 g/dL (ref 6.0–8.5)
eGFR: 120 mL/min/{1.73_m2} (ref >59)

## 2022-07-11 LAB — LIPID PANEL
Chol/HDL Ratio: 3.8 ratio (ref 0.0–4.4)
HDL: 54 mg/dL (ref >39)
LDL Chol Calc (NIH): 137 mg/dL — ABNORMAL HIGH (ref 0–99)
VLDL Cholesterol Cal: 15 mg/dL (ref 5–40)

## 2022-07-11 LAB — TSH: TSH: 1.86 u[IU]/mL (ref 0.450–4.500)

## 2022-07-11 LAB — VITAMIN D 25 HYDROXY (VIT D DEFICIENCY, FRACTURES): Vit D, 25-Hydroxy: 31.9 ng/mL (ref 30.0–100.0)

## 2022-07-11 LAB — HEMOGLOBIN A1C: Hgb A1c MFr Bld: 5.3 % (ref 4.8–5.6)

## 2022-07-11 LAB — T4, FREE: Free T4: 1.46 ng/dL (ref 0.82–1.77)

## 2022-07-19 ENCOUNTER — Other Ambulatory Visit: Payer: Self-pay | Admitting: Medical

## 2022-07-19 MED ORDER — LEVOTHYROXINE SODIUM 50 MCG PO TABS
ORAL_TABLET | ORAL | 3 refills | Status: DC
  Filled 2022-07-19: qty 45, fill #0
  Filled 2022-07-21: qty 45, 90d supply, fill #0

## 2022-07-19 MED ORDER — VITAMIN D 50 MCG (2000 UT) PO CAPS
1.0000 | ORAL_CAPSULE | Freq: Every day | ORAL | 3 refills | Status: DC
  Filled 2022-07-19: qty 90, 90d supply, fill #0

## 2022-07-19 MED ORDER — LEVOTHYROXINE SODIUM 75 MCG PO TABS
ORAL_TABLET | ORAL | 3 refills | Status: DC
  Filled 2022-07-19: qty 45, fill #0
  Filled 2022-07-21: qty 45, 90d supply, fill #0

## 2022-07-19 NOTE — Progress Notes (Signed)
Results sent through MyChart

## 2022-07-20 ENCOUNTER — Other Ambulatory Visit (HOSPITAL_COMMUNITY): Payer: Self-pay

## 2022-07-20 ENCOUNTER — Other Ambulatory Visit: Payer: Self-pay

## 2022-07-21 ENCOUNTER — Other Ambulatory Visit (HOSPITAL_COMMUNITY): Payer: Self-pay

## 2022-07-22 ENCOUNTER — Other Ambulatory Visit (HOSPITAL_COMMUNITY): Payer: Self-pay

## 2022-07-28 ENCOUNTER — Other Ambulatory Visit (HOSPITAL_COMMUNITY): Payer: Self-pay

## 2022-08-03 LAB — RESULTS CONSOLE HPV: CHL HPV: NEGATIVE

## 2022-08-03 LAB — HM PAP SMEAR: HM Pap smear: NEGATIVE

## 2022-08-12 DIAGNOSIS — Z113 Encounter for screening for infections with a predominantly sexual mode of transmission: Secondary | ICD-10-CM | POA: Diagnosis not present

## 2022-08-12 DIAGNOSIS — Z124 Encounter for screening for malignant neoplasm of cervix: Secondary | ICD-10-CM | POA: Diagnosis not present

## 2022-08-12 DIAGNOSIS — Z01419 Encounter for gynecological examination (general) (routine) without abnormal findings: Secondary | ICD-10-CM | POA: Diagnosis not present

## 2022-08-12 DIAGNOSIS — N951 Menopausal and female climacteric states: Secondary | ICD-10-CM | POA: Diagnosis not present

## 2022-08-12 DIAGNOSIS — Z01411 Encounter for gynecological examination (general) (routine) with abnormal findings: Secondary | ICD-10-CM | POA: Diagnosis not present

## 2022-08-14 ENCOUNTER — Other Ambulatory Visit (HOSPITAL_COMMUNITY): Payer: Self-pay

## 2022-08-14 MED ORDER — FLUCONAZOLE 150 MG PO TABS
ORAL_TABLET | ORAL | 0 refills | Status: DC
  Filled 2022-08-14: qty 3, 7d supply, fill #0

## 2022-08-27 ENCOUNTER — Encounter: Payer: 59 | Admitting: Medical

## 2022-10-20 ENCOUNTER — Other Ambulatory Visit (HOSPITAL_COMMUNITY): Payer: Self-pay

## 2022-10-21 ENCOUNTER — Telehealth: Payer: Self-pay | Admitting: Internal Medicine

## 2022-10-21 ENCOUNTER — Other Ambulatory Visit (HOSPITAL_COMMUNITY): Payer: Self-pay

## 2022-10-21 MED ORDER — LEVOTHYROXINE SODIUM 50 MCG PO TABS
ORAL_TABLET | ORAL | 3 refills | Status: DC
  Filled 2022-10-21: qty 45, 90d supply, fill #0
  Filled 2023-01-19 (×2): qty 45, 90d supply, fill #1
  Filled 2023-04-18: qty 45, 90d supply, fill #2
  Filled 2023-07-19: qty 45, 90d supply, fill #3

## 2022-10-21 MED ORDER — LEVOTHYROXINE SODIUM 75 MCG PO TABS
ORAL_TABLET | ORAL | 3 refills | Status: DC
  Filled 2022-10-21: qty 45, 90d supply, fill #0
  Filled 2023-01-19 (×2): qty 45, 90d supply, fill #1
  Filled 2023-04-18: qty 45, 90d supply, fill #2
  Filled 2023-07-19: qty 45, 90d supply, fill #3

## 2022-10-21 NOTE — Telephone Encounter (Signed)
Pt requested a refill on synthroid

## 2022-12-17 ENCOUNTER — Encounter: Payer: Self-pay | Admitting: Nurse Practitioner

## 2022-12-17 ENCOUNTER — Other Ambulatory Visit (HOSPITAL_COMMUNITY): Payer: Self-pay

## 2022-12-17 ENCOUNTER — Ambulatory Visit (INDEPENDENT_AMBULATORY_CARE_PROVIDER_SITE_OTHER): Payer: 59 | Admitting: Nurse Practitioner

## 2022-12-17 ENCOUNTER — Other Ambulatory Visit: Payer: Self-pay | Admitting: Nurse Practitioner

## 2022-12-17 VITALS — BP 120/78 | HR 76 | Wt 219.2 lb

## 2022-12-17 DIAGNOSIS — N63 Unspecified lump in unspecified breast: Secondary | ICD-10-CM

## 2022-12-17 DIAGNOSIS — N644 Mastodynia: Secondary | ICD-10-CM

## 2022-12-17 MED ORDER — IBUPROFEN 800 MG PO TABS
800.0000 mg | ORAL_TABLET | Freq: Three times a day (TID) | ORAL | 1 refills | Status: DC | PRN
Start: 2022-12-17 — End: 2023-11-18
  Filled 2022-12-17: qty 60, 20d supply, fill #0

## 2022-12-17 NOTE — Progress Notes (Unsigned)
  Tollie Eth, DNP, AGNP-c [facility] [address] [place], [place] [address] [phone]   ACUTE VISIT- ESTABLISHED PATIENT  Blood pressure 120/78, pulse 76, weight 219 lb 3.2 oz (99.4 kg).  Subjective:  HPI Melissa Torres is a 37 y.o. female presents to day for evaluation of acute concern(s).   The patient, identified as Melissa Torres, presented with a chief complaint of a tender, subcutaneous mass in the breast, which has been persisting for approximately four weeks. The patient initially dismissed the discomfort as a result of improper sleeping position or bra fit. However, the tenderness has progressively worsened, to the point where wearing a bra has become uncomfortable. The patient reported that the pain intensifies upon touching the area or raising the arm above the head.  The patient noted that the mass is not visible externally, with no associated redness or swelling. Upon self-examination, the patient was able to palpate a small, pea-sized nodule twice, but has had difficulty locating it consistently. The patient denied any nipple discharge or systemic symptoms such as fever.  The patient has a history of irregular menstrual cycles, with periods of amenorrhea lasting up to six months. She also reported a history of autoimmune issues, thyroid disease, and Eosinophilic esophagitis (EOE), which she believes were triggered by previous in-vitro fertilization (IVF) treatments. The patient also mentioned a recurrent skin lesion that appears and resolves with each menstrual cycle, which she manages with tea tree oil.  The patient denied any recent changes in medications or new medical diagnoses. She has not tried any interventions such as heat, cold, or over-the-counter medications for the breast discomfort. The patient expressed concern and a desire for further investigation of the breast mass.  ROS negative except for what is listed in HPI. History, Medications,  Surgery, SDOH, and Family History reviewed and updated as appropriate.  Objective:  Physical Exam  Tenderness present to the left breast from the 4 oclock to 6 oclock position proximally to the nipple. The skin coloration appears normal with no evidence of fluctuance, erythema, or streaking. Skin texture normal. No drainage from the nipple.  Right breast normal.  No lymphadenopathy.      Assessment & Plan:   Problem List Items Addressed This Visit   None     Tollie Eth, DNP, AGNP-c

## 2022-12-17 NOTE — Patient Instructions (Addendum)
I have sent the referral for the ultrasound to the breast center on the corner of [address] and [address].    Breast Tenderness Breast tenderness is a common problem for women of all ages, but may also occur in men. Breast tenderness has many possible causes, including hormone changes, infections, taking certain medicines, and caffeine intake. In women, the pain usually comes and goes with the menstrual cycle, but it can also be constant. Breast tenderness may range from mild discomfort to severe pain. You may have tests, such as a mammogram or an ultrasound, to check for any unusual findings. Having breast tenderness usually does not mean that you have breast cancer. Follow these instructions at home: Managing pain and discomfort  If directed, put ice on the painful area. To do this: Put ice in a plastic bag. Place a towel between your skin and the bag. Leave the ice on for 20 minutes, 2-3 times a day. If your skin turns bright red, remove the ice right away to prevent skin damage. The risk of skin damage is higher if you cannot feel pain, heat, or cold. Wear a supportive bra or chest support: During exercise. While sleeping, if your breasts are very tender. Medicines Take over-the-counter and prescription medicines only as told by your health care provider. If the cause of your pain is an infection, you may be prescribed an antibiotic medicine. If you were prescribed antibiotics, take them as told by your health care provider. Do not stop using the antibiotic even if you start to feel better. Eating and drinking Decrease the amount of caffeine in your diet. Instead, drink more water and choose caffeine-free drinks. Your health care provider may recommend that you lessen the amount of fat in your diet. You can do this by: Limiting fried foods. Cooking foods using methods such as baking, boiling, grilling, and broiling. General instructions  Keep a log of the days and times when your  breasts are most tender. Ask your health care provider how to do breast exams at home. This will help you notice if you have an unusual growth or lump. Keep all follow-up visits. Contact a health care provider if: Any part of your breast is hard, red, and hot to the touch. This may be a sign of infection. You are a woman and have a new or painful lump in your breast that remains after your menstrual period ends. You are not breastfeeding and you have fluid, especially blood or pus, coming out of your nipples. You have a fever. Your pain does not improve or it gets worse. Your pain is interfering with your daily activities. Summary Breast tenderness may range from mild discomfort to severe pain. Breast tenderness has many possible causes, including hormone changes, infections, taking certain medicines, and caffeine intake. It can be treated with ice, wearing a supportive bra or chest support, and medicines. Make changes to your diet as told by your health care provider. This information is not intended to replace advice given to you by your health care provider. Make sure you discuss any questions you have with your health care provider. Document Revised: 03/26/2021 Document Reviewed: 03/26/2021 Elsevier Patient Education  2024 [employer].

## 2022-12-20 DIAGNOSIS — N644 Mastodynia: Secondary | ICD-10-CM | POA: Insufficient documentation

## 2022-12-20 MED ORDER — CEPHALEXIN 500 MG PO CAPS
500.0000 mg | ORAL_CAPSULE | Freq: Four times a day (QID) | ORAL | 0 refills | Status: DC
  Filled 2022-12-20: qty 40, 10d supply, fill #0

## 2022-12-20 NOTE — Assessment & Plan Note (Signed)
Significant tenderness to the left breast at about the 4-6 oclock positioning proximal to the nipple. There are no skin changes, masses, erythema, or warmth present on examination. The area does not appear fluctuant with palpation. Discussion with patient today. Will send for stat US given the level of pain detected and pain medication. Mammogram may be needed, but will allow the radiologist to help determine once [place] completed. Pain medication sent.

## 2022-12-20 NOTE — Addendum Note (Signed)
Addended by: Mishel Sans, [place] Dec E on: 12/20/2022 05:56 PM   Modules accepted: Orders

## 2022-12-21 ENCOUNTER — Ambulatory Visit: Payer: 59 | Admitting: Nurse Practitioner

## 2022-12-21 ENCOUNTER — Other Ambulatory Visit (HOSPITAL_COMMUNITY): Payer: Self-pay

## 2022-12-21 ENCOUNTER — Other Ambulatory Visit: Payer: Self-pay

## 2022-12-22 ENCOUNTER — Ambulatory Visit
Admission: RE | Admit: 2022-12-22 | Discharge: 2022-12-22 | Disposition: A | Discharge status: Home / Self Care | Payer: 59 | Source: Ambulatory Visit | Attending: Nurse Practitioner | Admitting: Nurse Practitioner

## 2022-12-22 ENCOUNTER — Inpatient Hospital Stay
Admission: RE | Admit: 2022-12-22 | Discharge: 2022-12-22 | Discharge status: Home / Self Care | Payer: 59 | Source: Ambulatory Visit | Attending: Nurse Practitioner

## 2022-12-22 ENCOUNTER — Other Ambulatory Visit (HOSPITAL_COMMUNITY): Payer: Self-pay

## 2022-12-22 DIAGNOSIS — N6042 Mammary duct ectasia of left breast: Secondary | ICD-10-CM | POA: Diagnosis not present

## 2022-12-22 DIAGNOSIS — N63 Unspecified lump in unspecified breast: Secondary | ICD-10-CM

## 2022-12-22 DIAGNOSIS — N644 Mastodynia: Secondary | ICD-10-CM

## 2022-12-23 ENCOUNTER — Other Ambulatory Visit: Payer: Self-pay | Admitting: Nurse Practitioner

## 2022-12-23 DIAGNOSIS — N644 Mastodynia: Secondary | ICD-10-CM

## 2022-12-28 ENCOUNTER — Other Ambulatory Visit: Payer: 59

## 2022-12-31 ENCOUNTER — Other Ambulatory Visit (HOSPITAL_COMMUNITY): Payer: Self-pay

## 2023-01-19 ENCOUNTER — Other Ambulatory Visit (HOSPITAL_COMMUNITY): Payer: Self-pay

## 2023-01-19 ENCOUNTER — Other Ambulatory Visit: Payer: Self-pay

## 2023-01-22 ENCOUNTER — Other Ambulatory Visit (HOSPITAL_COMMUNITY): Payer: Self-pay

## 2023-04-19 ENCOUNTER — Other Ambulatory Visit: Payer: Self-pay | Admitting: Obstetrics and Gynecology

## 2023-04-19 ENCOUNTER — Other Ambulatory Visit (HOSPITAL_COMMUNITY): Payer: Self-pay

## 2023-04-19 MED ORDER — FLUCONAZOLE 150 MG PO TABS
ORAL_TABLET | ORAL | 0 refills | Status: DC
  Filled 2023-04-19: qty 3, 7d supply, fill #0

## 2023-04-27 ENCOUNTER — Other Ambulatory Visit (HOSPITAL_COMMUNITY): Payer: Self-pay

## 2023-07-14 ENCOUNTER — Encounter: Payer: 59 | Admitting: Medical

## 2023-07-19 ENCOUNTER — Other Ambulatory Visit (HOSPITAL_COMMUNITY): Payer: Self-pay

## 2023-07-19 ENCOUNTER — Other Ambulatory Visit: Payer: Self-pay | Admitting: Medical

## 2023-07-19 MED ORDER — FAMOTIDINE 20 MG PO TABS
20.0000 mg | ORAL_TABLET | Freq: Two times a day (BID) | ORAL | 0 refills | Status: DC
  Filled 2023-07-19 – 2023-07-29 (×2): qty 60, 30d supply, fill #0

## 2023-07-29 ENCOUNTER — Other Ambulatory Visit (HOSPITAL_COMMUNITY): Payer: Self-pay

## 2023-08-08 DIAGNOSIS — S0512XA Contusion of eyeball and orbital tissues, left eye, initial encounter: Secondary | ICD-10-CM | POA: Diagnosis not present

## 2023-08-08 DIAGNOSIS — S0511XA Contusion of eyeball and orbital tissues, right eye, initial encounter: Secondary | ICD-10-CM | POA: Diagnosis not present

## 2023-08-16 DIAGNOSIS — S0511XD Contusion of eyeball and orbital tissues, right eye, subsequent encounter: Secondary | ICD-10-CM | POA: Diagnosis not present

## 2023-08-16 DIAGNOSIS — S0512XD Contusion of eyeball and orbital tissues, left eye, subsequent encounter: Secondary | ICD-10-CM | POA: Diagnosis not present

## 2023-09-02 ENCOUNTER — Other Ambulatory Visit: Payer: Self-pay | Admitting: Medical

## 2023-09-02 ENCOUNTER — Other Ambulatory Visit (HOSPITAL_COMMUNITY): Payer: Self-pay

## 2023-09-02 MED ORDER — FAMOTIDINE 20 MG PO TABS
20.0000 mg | ORAL_TABLET | Freq: Two times a day (BID) | ORAL | 0 refills | Status: DC
  Filled 2023-09-02: qty 60, 30d supply, fill #0

## 2023-09-02 NOTE — Telephone Encounter (Signed)
 Pt was informed she is overdue for a visit

## 2023-10-09 ENCOUNTER — Other Ambulatory Visit (HOSPITAL_COMMUNITY): Payer: Self-pay

## 2023-10-09 ENCOUNTER — Other Ambulatory Visit: Payer: Self-pay | Admitting: Medical

## 2023-10-11 ENCOUNTER — Other Ambulatory Visit (HOSPITAL_COMMUNITY): Payer: Self-pay

## 2023-10-11 ENCOUNTER — Telehealth: Payer: Self-pay | Admitting: Internal Medicine

## 2023-10-11 MED ORDER — LEVOTHYROXINE SODIUM 50 MCG PO TABS
ORAL_TABLET | ORAL | 0 refills | Status: DC
  Filled 2023-10-11: qty 45, 90d supply, fill #0

## 2023-10-11 MED ORDER — FAMOTIDINE 20 MG PO TABS
20.0000 mg | ORAL_TABLET | Freq: Two times a day (BID) | ORAL | 0 refills | Status: DC
  Filled 2023-10-11: qty 60, 30d supply, fill #0

## 2023-10-11 MED ORDER — LEVOTHYROXINE SODIUM 75 MCG PO TABS
ORAL_TABLET | ORAL | 0 refills | Status: DC
  Filled 2023-10-11: qty 45, 90d supply, fill #0

## 2023-10-11 MED ORDER — FAMOTIDINE 20 MG PO TABS
20.0000 mg | ORAL_TABLET | Freq: Two times a day (BID) | ORAL | 0 refills | Status: DC
  Filled 2023-10-11 (×2): qty 120, 60d supply, fill #0

## 2023-10-11 NOTE — Telephone Encounter (Signed)
 Patient is scheduled for October for CPE. Refilled medication for 90 days

## 2023-10-11 NOTE — Telephone Encounter (Signed)
 Patient was notified that she needs to schedule a visit

## 2023-11-18 ENCOUNTER — Encounter: Payer: Self-pay | Admitting: Medical

## 2023-11-18 ENCOUNTER — Ambulatory Visit: Admitting: Medical

## 2023-11-18 VITALS — BP 110/80 | HR 69 | Ht 68.0 in | Wt 221.0 lb

## 2023-11-18 DIAGNOSIS — E039 Hypothyroidism, unspecified: Secondary | ICD-10-CM

## 2023-11-18 DIAGNOSIS — Z13220 Encounter for screening for lipoid disorders: Secondary | ICD-10-CM | POA: Diagnosis not present

## 2023-11-18 DIAGNOSIS — Z6833 Body mass index (BMI) 33.0-33.9, adult: Secondary | ICD-10-CM

## 2023-11-18 DIAGNOSIS — K429 Umbilical hernia without obstruction or gangrene: Secondary | ICD-10-CM

## 2023-11-18 DIAGNOSIS — Z1389 Encounter for screening for other disorder: Secondary | ICD-10-CM | POA: Diagnosis not present

## 2023-11-18 DIAGNOSIS — R7301 Impaired fasting glucose: Secondary | ICD-10-CM | POA: Insufficient documentation

## 2023-11-18 DIAGNOSIS — K200 Eosinophilic esophagitis: Secondary | ICD-10-CM

## 2023-11-18 DIAGNOSIS — E162 Hypoglycemia, unspecified: Secondary | ICD-10-CM

## 2023-11-18 DIAGNOSIS — Z131 Encounter for screening for diabetes mellitus: Secondary | ICD-10-CM | POA: Diagnosis not present

## 2023-11-18 DIAGNOSIS — Z0000 Encounter for general adult medical examination without abnormal findings: Secondary | ICD-10-CM | POA: Diagnosis not present

## 2023-11-18 DIAGNOSIS — Z7185 Encounter for immunization safety counseling: Secondary | ICD-10-CM

## 2023-11-18 DIAGNOSIS — Z136 Encounter for screening for cardiovascular disorders: Secondary | ICD-10-CM

## 2023-11-18 NOTE — Progress Notes (Signed)
 Subjective:   HPI  Melissa Torres is a 38 y.o. female who presents for Chief Complaint  Patient presents with   Annual Exam    Fasting cpe, no concerns    Patient Care Team: Cindee Mclester, Alm GORMAN RIGGERS as PCP - General (Family Medicine) Sees dentist Gynecology, Dr. Dick Flowers formerly, now Dr. Barbette  Dr. Aloha Finner, GI Dr. Octavia, ophthalmology   Concerns: Melissa Torres is a 38 year old female who presents for a routine follow-up visit.  She has a history of allergies to latex, prednisone, steroids, flu shots, and iodine . She reports taking Pepcid  regularly and is on a 50/75 mcg rotation of thyroid  medication every other day. She does not take multivitamins or vitamin D  regularly but intends to resume them.  Her surgical history includes tonsillectomy, adenoidectomy, knee surgery, and esophagogastroduodenoscopy (EGD).  She exercises regularly, running with her children and aiming for three miles a day, though she sometimes achieves one and a half miles. She also engages in weight-bearing exercises twice a week. 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 The nodule resolved after about three weeks with self-massage and hot water application.  She has experienced hypoglycemia in the past, particularly during fertility treatments, but reports infrequent episodes currently.  She mentions an umbilical hernia that is asymptomatic aside from protrusion.  She does not smoke, use drugs, or consume alcohol. She has not eaten today, only drinking wate  Reviewed their medical, surgical, family, social, medication, and allergy history and updated chart as appropriate.  Past Medical History:  Diagnosis Date   Eczema    Eosinophilic esophagitis 2023   Hypoglycemia    Hypothyroidism    Internal derangement of right knee    Newborn product of in  vitro fertilization (IVF) pregnancy    Unicornate uterus affecting pregnancy     Family History  Problem Relation Age of Onset   Hypertension Mother    Diabetes Mother    Atrial fibrillation Mother    Thyroid  nodules Mother    Crohn's disease Sister    Heart failure Maternal Grandmother    Diabetes Maternal Grandfather    Prostate cancer Maternal Grandfather    Colon cancer Paternal Grandmother    Heart disease Neg Hx    Past Surgical History:  Procedure Laterality Date   CESAREAN SECTION N/A 12/25/2017   Procedure: Primary CESAREAN SECTION;  Surgeon: Rutherford Gain, MD;  Location: [facility];  Service: Obstetrics;  Laterality: N/A;  EDD: 01/01/18 Allergy: IVP Dye, Flu Vaccine Daine Abbot, RNFA  Theo Reichert, CST Olam Fish, RN Adrien Caldwell, CRNA   CESAREAN SECTION WITH BILATERAL TUBAL LIGATION Bilateral 02/02/2020   Procedure: CESAREAN SECTION WITH BILATERAL TUBAL LIGATION;  Surgeon: Flowers Ade, MD;  Location: [facility];  Service: Obstetrics;  Laterality: Bilateral;   egg retrieval     ESOPHAGOGASTRODUODENOSCOPY  2023   multiple in 2023   KNEE ARTHROSCOPY Right 11/07/2012   Procedure: RIGHT KNEE ARTHROSCOPY PLICA RESECTION DEBRIDEMENT AND CHONDROPLASTY ;  Surgeon: Lamar Collet, MD;  Location: [facility];  Service: Orthopedics;  Laterality: Right;   TONSILLECTOMY AND ADENOIDECTOMY  AGE 2      Current Outpatient Medications:    famotidine  (PEPCID ) 20 MG tablet, Take 1 tablet (20 mg) by mouth 2 times daily., Disp: 120 tablet, Rfl: 0   levothyroxine  (SYNTHROID ) 50 MCG  tablet, Take 1 tablet by mouth every other day, alternate with 75mcg dosage., Disp: 45 tablet, Rfl: 0   levothyroxine  (SYNTHROID ) 75 MCG tablet, Take 1 tablet by mouth every other day, alternate with 50mcg dose., Disp: 45 tablet, Rfl: 0  Allergies  Allergen Reactions   Ivp Dye [Iodinated Contrast Media] Hives and Itching   Barium-Containing Compounds Hives, Itching and  Other (See Comments)    Burning in throat and chest and itching all over  Hives on both feet and both hands  Took 2 doses 100 mg Benadryl  to resolve symptoms    Influenza Vaccines Other (See Comments)    Painful pustules in mouth and throat   Other     Steroids- hallucinations, anxiety CHG wipes/solutions--flares up eczema/itching   Prednisone     Oral steroids give hallucinations, but does ok with topical steroids   Latex Itching and Rash    SEVERE ITCHING    Review of Systems  Constitutional:  Negative for chills, fever, malaise/fatigue and weight loss.  HENT:  Negative for congestion, ear pain, hearing loss, sore throat and tinnitus.   Eyes:  Negative for blurred vision, pain and redness.  Respiratory:  Negative for cough, hemoptysis and shortness of breath.   Cardiovascular:  Negative for chest pain, palpitations, orthopnea, claudication and leg swelling.  Gastrointestinal:  Negative for abdominal pain, blood in stool, constipation, diarrhea, nausea and vomiting.  Genitourinary:  Negative for dysuria, flank pain, frequency, hematuria and urgency.  Musculoskeletal:  Negative for falls, joint pain and myalgias.  Skin:  Negative for itching and rash.  Neurological:  Negative for dizziness, tingling, speech change, weakness and headaches.  Endo/Heme/Allergies:  Negative for polydipsia. Does not bruise/bleed easily.  Psychiatric/Behavioral:  Negative for depression and memory loss. The patient is not nervous/anxious and does not have insomnia.         11/18/2023    9:49 AM 10/27/2021    8:43 AM 05/23/2021    8:27 AM 05/21/2020    8:27 AM 03/15/2019    8:20 AM  Depression screen PHQ 2/9  Decreased Interest 0 0 0 0 0  Down, Depressed, Hopeless 0 0 0 0 0  PHQ - 2 Score 0 0 0 0 0       Objective:  BP 110/80   Pulse 69   Ht 5' 8 (1.727 m)   Wt 221 lb (100.2 kg)   SpO2 99%   BMI 33.60 kg/m   Wt Readings from Last 3 Encounters:  11/18/23 221 lb (100.2 kg)  12/17/22 219 lb  3.2 oz (99.4 kg)  07/10/22 208 lb 12.8 oz (94.7 kg)    General appearance: alert, no distress, WD/WN, Caucasian female Skin:no worrisome lesions HEENT: normocephalic, conjunctiva/corneas normal, sclerae anicteric, PERRLA, EOMi, nares patent, no discharge or erythema, pharynx normal Oral cavity: MMM, tongue normal, teeth normal Neck: supple, no lymphadenopathy, no thyromegaly, no masses, normal ROM, no bruits Chest: non tender, normal shape and expansion Heart: RRR, normal S1, S2, no murmurs Lungs: CTA bilaterally, no wheezes, rhonchi, or rales Abdomen: +bs, soft, small reducible umbilical hernia, non tender, non distended, no masses, no hepatomegaly, no splenomegaly, no bruits Back: non tender, normal ROM, no scoliosis Musculoskeletal: upper extremities non tender, no obvious deformity, normal ROM throughout, lower extremities non tender, no obvious deformity, normal ROM throughout Extremities: no edema, no cyanosis, no clubbing Pulses: 2+ symmetric, upper and lower extremities, normal cap refill Neurological: alert, oriented x 3, CN2-12 intact, strength normal upper extremities and lower extremities, sensation normal throughout,  DTRs 2+ throughout, no cerebellar signs, gait normal Psychiatric: normal affect, behavior normal, pleasant  Breast/gyn/rectal - deferred to gynecology    Assessment and Plan :   Encounter Diagnoses  Name Primary?   Encounter for health maintenance examination in adult Yes   Hypothyroidism, unspecified type    Screening for hematuria or proteinuria    Vaccine counseling    Encounter for lipid screening for cardiovascular disease    Hypoglycemia    Screening for diabetes mellitus    BMI 33.0-33.9,adult    Eosinophilic esophagitis    Umbilical hernia without obstruction and without gangrene      This visit was a preventative care visit, also known as wellness visit or routine physical.   Topics typically include healthy lifestyle, diet, exercise,  preventative care, vaccinations, sick and well care, proper use of emergency dept and after hours care, as well as other concerns.     Recommendations: Continue to return yearly for your annual wellness and preventative care visits.  This gives us  a chance to discuss healthy lifestyle, exercise, vaccinations, review your chart record, and perform screenings where appropriate.  I recommend you see your eye doctor yearly for routine vision care.  I recommend you see your dentist yearly for routine dental care including hygiene visits twice yearly.  See your gynecologist yearly for routine gynecological care.   Vaccination recommendations were reviewed Immunization History  Administered Date(s) Administered   Tdap 10/27/2017, 01/03/2020   Allergies to flu vaccine   Screening for cancer: Colon cancer screening: Age 70  Breast cancer screening: You should perform a self breast exam monthly.   We reviewed recommendations for regular mammograms and breast cancer screening.  Cervical cancer screening: We reviewed recommendations for pap smear screening.   Skin cancer screening: Check your skin regularly for new changes, growing lesions, or other lesions of concern Come in for evaluation if you have skin lesions of concern.  Lung cancer screening: If you have a greater than 20 pack year history of tobacco use, then you may qualify for lung cancer screening with a chest CT scan.   Please call your insurance company to inquire about coverage for this test.  We currently don't have screenings for other cancers besides breast, cervical, colon, and lung cancers.  If you have a strong family history of cancer or have other cancer screening concerns, please let me know.    Bone health: Get at least 150 minutes of aerobic exercise weekly Get weight bearing exercise at least once weekly Bone density test:  A bone density test is an imaging test that uses a type of X-ray to measure the  amount of calcium and other minerals in your bones. The test may be used to diagnose or screen you for a condition that causes weak or thin bones (osteoporosis), predict your risk for a broken bone (fracture), or determine how well your osteoporosis treatment is working. The bone density test is recommended for females 65 and older, or females or males <65 if certain risk factors such as thyroid  disease, long term use of steroids such as for asthma or rheumatological issues, vitamin D  deficiency, estrogen deficiency, family history of osteoporosis, self or family history of fragility fracture in first degree relative.    Heart health: Get at least 150 minutes of aerobic exercise weekly Limit alcohol It is important to maintain a healthy blood pressure and healthy cholesterol numbers  Heart disease screening: Screening for heart disease includes screening for blood pressure, fasting lipids, glucose/diabetes  screening, BMI height to weight ratio, reviewed of smoking status, physical activity, and diet.    Goals include blood pressure 120/80 or less, maintaining a healthy lipid/cholesterol profile, preventing diabetes or keeping diabetes numbers under good control, not smoking or using tobacco products, exercising most days per week or at least 150 minutes per week of exercise, and eating healthy variety of fruits and vegetables, healthy oils, and avoiding unhealthy food choices like fried food, fast food, high sugar and high cholesterol foods.    Other tests may possibly include EKG test, CT coronary calcium score, echocardiogram, exercise treadmill stress test.    Medical care options: I recommend you continue to seek care here first for routine care.  We try really hard to have available appointments Monday through Friday daytime hours for sick visits, acute visits, and physicals.  Urgent care should be used for after hours and weekends for significant issues that cannot wait till the next day.   The emergency department should be used for significant potentially life-threatening emergencies.  The emergency department is expensive, can often have long wait times for less significant concerns, so try to utilize primary care, urgent care, or telemedicine when possible to avoid unnecessary trips to the emergency department.  Virtual visits and telemedicine have been introduced since the pandemic started in 2020, and can be convenient ways to receive medical care.  We offer virtual appointments as well to assist you in a variety of options to seek medical care.   Separate significant issues discussed: Hypothyroidism Stable on 50 mcg alternating every day with 75 mcg thyroid  medication rotation. - Check thyroid  levels.  Hyperlipidemia Previously noted slightly elevated cholesterol. - Check cholesterol levels.  Hypoglycemia History of hypoglycemia during fertility treatments, with episodes throughout the year. - labs as below  BMI 30+ - work on efforts to lose weight though healthy diet and exercise   Madelyne was seen today for annual exam.  Diagnoses and all orders for this visit:  Encounter for health maintenance examination in adult -     CBC -     Comprehensive metabolic panel with GFR -     Lipid panel -     TSH + free T4 -     Urinalysis, Routine w reflex microscopic -     VITAMIN D  25 Hydroxy (Vit-D Deficiency, Fractures) -     Hemoglobin A1c -     Cortisol  Hypothyroidism, unspecified type -     TSH + free T4  Screening for hematuria or proteinuria  Vaccine counseling  Encounter for lipid screening for cardiovascular disease -     Lipid panel  Hypoglycemia -     Cortisol  Screening for diabetes mellitus  BMI 33.0-33.9,adult  Eosinophilic esophagitis  Umbilical hernia without obstruction and without gangrene    Follow-up pending labs, yearly for physical

## 2023-11-19 ENCOUNTER — Encounter: Admitting: Medical

## 2023-11-19 ENCOUNTER — Other Ambulatory Visit: Payer: Self-pay

## 2023-11-19 ENCOUNTER — Ambulatory Visit: Payer: Self-pay | Admitting: Medical

## 2023-11-19 ENCOUNTER — Other Ambulatory Visit (HOSPITAL_COMMUNITY): Payer: Self-pay

## 2023-11-19 ENCOUNTER — Other Ambulatory Visit: Payer: Self-pay | Admitting: Medical

## 2023-11-19 LAB — URINALYSIS, ROUTINE W REFLEX MICROSCOPIC
Bilirubin, UA: NEGATIVE
Glucose, UA: NEGATIVE
Ketones, UA: NEGATIVE
Leukocytes,UA: NEGATIVE
Nitrite, UA: NEGATIVE
Protein,UA: NEGATIVE
RBC, UA: NEGATIVE
Specific Gravity, UA: 1.014 (ref 1.005–1.030)
Urobilinogen, Ur: 0.2 mg/dL (ref 0.2–1.0)
pH, UA: 6.5 (ref 5.0–7.5)

## 2023-11-19 LAB — COMPREHENSIVE METABOLIC PANEL WITH GFR
ALT: 17 IU/L (ref 0–32)
AST: 13 IU/L (ref 0–40)
Albumin: 4.7 g/dL (ref 3.9–4.9)
Alkaline Phosphatase: 81 IU/L (ref 41–116)
BUN/Creatinine Ratio: 17 (ref 9–23)
BUN: 10 mg/dL (ref 6–20)
Bilirubin Total: 0.6 mg/dL (ref 0.0–1.2)
CO2: 21 mmol/L (ref 20–29)
Calcium: 9.7 mg/dL (ref 8.7–10.2)
Chloride: 103 mmol/L (ref 96–106)
Creatinine, Ser: 0.6 mg/dL (ref 0.57–1.00)
Globulin, Total: 2.8 g/dL (ref 1.5–4.5)
Glucose: 82 mg/dL (ref 70–99)
Potassium: 4.1 mmol/L (ref 3.5–5.2)
Sodium: 139 mmol/L (ref 134–144)
Total Protein: 7.5 g/dL (ref 6.0–8.5)
eGFR: 118 mL/min/1.73 (ref >59)

## 2023-11-19 LAB — HEMOGLOBIN A1C
Est. average glucose Bld gHb Est-mCnc: 103 mg/dL
Hgb A1c MFr Bld: 5.2 % (ref 4.8–5.6)

## 2023-11-19 LAB — CBC
Hematocrit: 42.5 % (ref 34.0–46.6)
Hemoglobin: 14.1 g/dL (ref 11.1–15.9)
MCH: 30.5 pg (ref 26.6–33.0)
MCHC: 33.2 g/dL (ref 31.5–35.7)
MCV: 92 fL (ref 79–97)
Platelets: 255 x10E3/uL (ref 150–450)
RBC: 4.62 x10E6/uL (ref 3.77–5.28)
RDW: 13.2 % (ref 11.7–15.4)
WBC: 7.1 x10E3/uL (ref 3.4–10.8)

## 2023-11-19 LAB — TSH+FREE T4
Free T4: 1.33 ng/dL (ref 0.82–1.77)
TSH: 2.49 u[IU]/mL (ref 0.450–4.500)

## 2023-11-19 LAB — LIPID PANEL
Chol/HDL Ratio: 4.2 ratio (ref 0.0–4.4)
Cholesterol, Total: 235 mg/dL — ABNORMAL HIGH (ref 100–199)
HDL: 56 mg/dL (ref >39)
LDL Chol Calc (NIH): 161 mg/dL — ABNORMAL HIGH (ref 0–99)
Triglycerides: 104 mg/dL (ref 0–149)
VLDL Cholesterol Cal: 18 mg/dL (ref 5–40)

## 2023-11-19 LAB — VITAMIN D 25 HYDROXY (VIT D DEFICIENCY, FRACTURES): Vit D, 25-Hydroxy: 23.4 ng/mL — ABNORMAL LOW (ref 30.0–100.0)

## 2023-11-19 LAB — CORTISOL: Cortisol: 7.2 ug/dL (ref 6.2–19.4)

## 2023-11-19 MED ORDER — VITAMIN D (ERGOCALCIFEROL) 1.25 MG (50000 UNIT) PO CAPS
50000.0000 [IU] | ORAL_CAPSULE | ORAL | 3 refills | Status: DC
  Filled 2023-11-19 – 2024-01-18 (×2): qty 12, 84d supply, fill #0

## 2023-11-19 MED ORDER — ROSUVASTATIN CALCIUM 10 MG PO TABS
10.0000 mg | ORAL_TABLET | Freq: Every day | ORAL | 3 refills | Status: DC
  Filled 2023-11-19 – 2024-01-18 (×2): qty 90, 90d supply, fill #0

## 2023-11-19 MED ORDER — LEVOTHYROXINE SODIUM 50 MCG PO TABS
ORAL_TABLET | ORAL | 3 refills | Status: AC
  Filled 2023-11-19: qty 45, fill #0
  Filled 2024-01-17 (×2): qty 45, 90d supply, fill #0

## 2023-11-19 MED ORDER — LEVOTHYROXINE SODIUM 75 MCG PO TABS
ORAL_TABLET | ORAL | 3 refills | Status: AC
  Filled 2023-11-19: qty 45, fill #0
  Filled 2024-01-17 (×2): qty 45, 90d supply, fill #0

## 2023-11-19 MED ORDER — FAMOTIDINE 20 MG PO TABS
20.0000 mg | ORAL_TABLET | Freq: Two times a day (BID) | ORAL | 3 refills | Status: DC
  Filled 2023-11-19: qty 120, 60d supply, fill #0

## 2023-11-19 NOTE — Progress Notes (Signed)
 Results thru my chart

## 2023-11-22 ENCOUNTER — Other Ambulatory Visit (HOSPITAL_COMMUNITY): Payer: Self-pay

## 2023-11-22 NOTE — Progress Notes (Signed)
 Results through MyChart

## 2023-11-30 ENCOUNTER — Other Ambulatory Visit (HOSPITAL_COMMUNITY): Payer: Self-pay

## 2023-12-03 ENCOUNTER — Other Ambulatory Visit (HOSPITAL_COMMUNITY): Payer: Self-pay

## 2023-12-03 ENCOUNTER — Other Ambulatory Visit: Payer: Self-pay

## 2023-12-03 MED ORDER — FAMOTIDINE 20 MG PO TABS
20.0000 mg | ORAL_TABLET | Freq: Two times a day (BID) | ORAL | 3 refills | Status: AC
  Filled 2023-12-03: qty 90, 45d supply, fill #0
  Filled 2024-01-18: qty 90, 45d supply, fill #1
  Filled 2024-02-26 (×2): qty 180, 90d supply, fill #2

## 2023-12-07 ENCOUNTER — Other Ambulatory Visit (HOSPITAL_COMMUNITY): Payer: Self-pay

## 2024-01-17 ENCOUNTER — Other Ambulatory Visit (HOSPITAL_COMMUNITY): Payer: Self-pay

## 2024-01-18 ENCOUNTER — Other Ambulatory Visit (HOSPITAL_COMMUNITY): Payer: Self-pay

## 2024-01-18 ENCOUNTER — Other Ambulatory Visit: Payer: Self-pay

## 2024-01-21 ENCOUNTER — Other Ambulatory Visit (HOSPITAL_COMMUNITY): Payer: Self-pay

## 2024-02-26 ENCOUNTER — Other Ambulatory Visit (HOSPITAL_COMMUNITY): Payer: Self-pay

## 2024-11-24 ENCOUNTER — Encounter: Payer: Self-pay | Admitting: Medical
